# Patient Record
Sex: Female | Born: 1935 | Race: White | Hispanic: No | Marital: Married | Smoking: Never smoker
Health system: Southern US, Community
[De-identification: ages and names within clinical notes are randomized; demographics above are authoritative.]

## PROBLEM LIST (undated history)

## (undated) DIAGNOSIS — H579 Unspecified disorder of eye and adnexa: Secondary | ICD-10-CM

## (undated) DIAGNOSIS — E039 Hypothyroidism, unspecified: Secondary | ICD-10-CM

## (undated) DIAGNOSIS — K5790 Diverticulosis of intestine, part unspecified, without perforation or abscess without bleeding: Secondary | ICD-10-CM

## (undated) DIAGNOSIS — M8580 Other specified disorders of bone density and structure, unspecified site: Secondary | ICD-10-CM

## (undated) DIAGNOSIS — G4730 Sleep apnea, unspecified: Secondary | ICD-10-CM

## (undated) DIAGNOSIS — R002 Palpitations: Principal | ICD-10-CM

## (undated) DIAGNOSIS — R0609 Other forms of dyspnea: Secondary | ICD-10-CM

## (undated) DIAGNOSIS — K635 Polyp of colon: Secondary | ICD-10-CM

## (undated) DIAGNOSIS — I4891 Unspecified atrial fibrillation: Secondary | ICD-10-CM

## (undated) DIAGNOSIS — N3941 Urge incontinence: Secondary | ICD-10-CM

## (undated) DIAGNOSIS — E7800 Pure hypercholesterolemia, unspecified: Secondary | ICD-10-CM

## (undated) DIAGNOSIS — H40009 Preglaucoma, unspecified, unspecified eye: Secondary | ICD-10-CM

## (undated) DIAGNOSIS — I480 Paroxysmal atrial fibrillation: Secondary | ICD-10-CM

## (undated) HISTORY — DX: Pure hypercholesterolemia, unspecified: E78.00

## (undated) HISTORY — DX: Paroxysmal atrial fibrillation: I48.0

## (undated) HISTORY — PX: ABDOMINAL HYSTERECTOMY: SHX81

## (undated) HISTORY — PX: COLONOSCOPY: SHX174

## (undated) HISTORY — DX: Polyp of colon: K63.5

## (undated) HISTORY — DX: Palpitations: R00.2

## (undated) HISTORY — DX: Other specified disorders of bone density and structure, unspecified site: M85.80

## (undated) HISTORY — DX: Unspecified atrial fibrillation: I48.91

## (undated) HISTORY — DX: Other forms of dyspnea: R06.09

## (undated) HISTORY — DX: Urge incontinence: N39.41

## (undated) HISTORY — DX: Hypothyroidism, unspecified: E03.9

## (undated) HISTORY — DX: Sleep apnea, unspecified: G47.30

## (undated) HISTORY — DX: Diverticulosis of intestine, part unspecified, without perforation or abscess without bleeding: K57.90

## (undated) HISTORY — DX: Preglaucoma, unspecified, unspecified eye: H40.009

## (undated) HISTORY — DX: Unspecified disorder of eye and adnexa: H57.9

## (undated) HISTORY — PX: EYE SURGERY: SHX253

---

## 1997-10-19 ENCOUNTER — Other Ambulatory Visit: Admission: RE | Admit: 1997-10-19 | Discharge: 1997-10-19 | Payer: Self-pay | Admitting: Family Medicine

## 1998-10-24 ENCOUNTER — Other Ambulatory Visit: Admission: RE | Admit: 1998-10-24 | Discharge: 1998-10-24 | Payer: Self-pay | Admitting: Family Medicine

## 1999-10-30 ENCOUNTER — Other Ambulatory Visit: Admission: RE | Admit: 1999-10-30 | Discharge: 1999-10-30 | Payer: Self-pay | Admitting: Family Medicine

## 2000-01-02 ENCOUNTER — Ambulatory Visit (HOSPITAL_COMMUNITY): Admission: RE | Admit: 2000-01-02 | Discharge: 2000-01-02 | Payer: Self-pay | Admitting: Gastroenterology

## 2000-01-02 ENCOUNTER — Encounter (INDEPENDENT_AMBULATORY_CARE_PROVIDER_SITE_OTHER): Payer: Self-pay | Admitting: Specialist

## 2000-10-02 ENCOUNTER — Other Ambulatory Visit: Admission: RE | Admit: 2000-10-02 | Discharge: 2000-10-02 | Payer: Self-pay | Admitting: Family Medicine

## 2002-02-17 ENCOUNTER — Encounter: Admission: RE | Admit: 2002-02-17 | Discharge: 2002-02-17 | Payer: Self-pay | Admitting: Family Medicine

## 2002-02-17 ENCOUNTER — Encounter: Payer: Self-pay | Admitting: Family Medicine

## 2002-03-09 ENCOUNTER — Ambulatory Visit (HOSPITAL_COMMUNITY): Admission: RE | Admit: 2002-03-09 | Discharge: 2002-03-09 | Payer: Self-pay | Admitting: Family Medicine

## 2002-03-09 ENCOUNTER — Encounter: Payer: Self-pay | Admitting: Family Medicine

## 2003-04-21 ENCOUNTER — Encounter: Admission: RE | Admit: 2003-04-21 | Discharge: 2003-04-21 | Payer: Self-pay | Admitting: Family Medicine

## 2004-11-07 ENCOUNTER — Encounter: Admission: RE | Admit: 2004-11-07 | Discharge: 2004-11-07 | Payer: Self-pay | Admitting: Family Medicine

## 2005-02-05 ENCOUNTER — Encounter: Admission: RE | Admit: 2005-02-05 | Discharge: 2005-02-05 | Payer: Self-pay | Admitting: Family Medicine

## 2005-04-15 ENCOUNTER — Encounter: Admission: RE | Admit: 2005-04-15 | Discharge: 2005-04-15 | Payer: Self-pay | Admitting: Gastroenterology

## 2005-06-24 ENCOUNTER — Encounter: Admission: RE | Admit: 2005-06-24 | Discharge: 2005-06-24 | Payer: Self-pay | Admitting: Gastroenterology

## 2005-10-01 ENCOUNTER — Encounter (INDEPENDENT_AMBULATORY_CARE_PROVIDER_SITE_OTHER): Payer: Self-pay | Admitting: Specialist

## 2005-10-01 ENCOUNTER — Inpatient Hospital Stay (HOSPITAL_COMMUNITY): Admission: RE | Admit: 2005-10-01 | Discharge: 2005-10-05 | Payer: Self-pay | Admitting: Surgery

## 2005-10-01 HISTORY — PX: LAPAROSCOPIC NISSEN FUNDOPLICATION: SHX1932

## 2007-02-08 ENCOUNTER — Encounter: Admission: RE | Admit: 2007-02-08 | Discharge: 2007-02-08 | Payer: Self-pay | Admitting: Family Medicine

## 2009-06-21 ENCOUNTER — Encounter: Admission: RE | Admit: 2009-06-21 | Discharge: 2009-06-21 | Payer: Self-pay | Admitting: Family Medicine

## 2010-06-06 ENCOUNTER — Ambulatory Visit (INDEPENDENT_AMBULATORY_CARE_PROVIDER_SITE_OTHER): Payer: Medicare Other | Admitting: Family Medicine

## 2010-06-06 DIAGNOSIS — E039 Hypothyroidism, unspecified: Secondary | ICD-10-CM

## 2010-06-06 DIAGNOSIS — E7800 Pure hypercholesterolemia, unspecified: Secondary | ICD-10-CM

## 2010-06-06 DIAGNOSIS — Z79899 Other long term (current) drug therapy: Secondary | ICD-10-CM

## 2010-06-06 DIAGNOSIS — E559 Vitamin D deficiency, unspecified: Secondary | ICD-10-CM

## 2010-06-06 DIAGNOSIS — Z01419 Encounter for gynecological examination (general) (routine) without abnormal findings: Secondary | ICD-10-CM

## 2010-06-20 ENCOUNTER — Ambulatory Visit: Payer: Medicare Other

## 2010-07-19 NOTE — Procedures (Signed)
[facility]  Patient:    Abigail Day, Abigail Day                           MRN: [id number] Proc. Date: 01/02/00 Adm. Date:  [id number] Attending:  Nelda Marseille CC:         Vale Haven. Andrey Campanile, M.D.   Procedure Report  REFERRING PHYSICIAN:  Duffy Rhody C. Andrey Campanile, M.D.  PROCEDURE:  Colonoscopy with biopsy.  INDICATION:  Patient with periodic diarrhea, due for colonic screening. Consent was signed after risks, benefits, methods, and options thoroughly discussed in the office.  MEDICATIONS:  Demerol 40 mg, Versed 5 mg.  DESCRIPTION OF PROCEDURE:  Rectal inspection was pertinent for external hemorrhoids.  Digital exam was negative.  The pediatric video colonoscope was inserted and once past a tortuous, diverticula-filled sigmoid was easily able to advance to the cecum.  This did require some left lower quadrant pressure and rolling her on her back.  On insertion, other than the left-sided diverticula, no other abnormalities were seen.  The cecum was identified by the appendiceal orifice and the ileocecal valve.  In fact, the scope was inserted a short way into the terminal ileum, which was normal.  Photo documentation and biopsies were obtained.  The scope was slowly withdrawn. The colon appeared normal on the right side and in the transverse.  The diverticula were all on the left side, and scattered random occasional biopsies were obtained to rule out microscopic colitis.  With slow withdrawal through the colon, no polypoid lesions, masses, or other abnormalities were seen.  Prep was adequate.  There was a small amount of liquid stool that required washing and suctioning.  Once back in the rectum, the scope was then retroflexed, pertinent for some internal hemorrhoids.  The scope was straightened, air was withdrawn, and the scope removed.  The patient tolerated the procedure well.  There was no obvious immediate complication.  ENDOSCOPIC DIAGNOSES: 1.  Internal-external hemorrhoids. 2. Significant left-sided diverticula and tortuosity. 3. Otherwise within normal limits to the terminal ileum, status post random    biopsies throughout.  PLAN:  Await pathology to see if any signs of microscopic colitis.  Repeat screening in five years unless needed sooner p.r.n.  Call me p.r.n., otherwise see me back in two to three months to recheck symptoms and make sure no further workup plans are needed.  In the meantime, let her use Robinul p.r.n., and we discussed its use prior to her procedure.  Would start with one-half to one in the morning and then on a bad day go up to two to three times a day but on good days, maybe not take any more. DD:  01/02/00 TD:  01/03/00 Job: [id number]

## 2010-07-19 NOTE — Op Note (Signed)
NAME:  Abigail Day, Abigail Day NO.:  [id number]   MEDICAL RECORD NO.:  [id number]          PATIENT TYPE:  INP   LOCATION:  0001                         FACILITY:  [facility]   PHYSICIAN:  Thornton Park. Daphine Deutscher, MD  DATE OF BIRTH:  February 21, 1934   DATE OF PROCEDURE:  10/01/2005  DATE OF DISCHARGE:                                 OPERATIVE REPORT   CCS NUMBER:  [id number].   PREOPERATIVE DIAGNOSIS:  A 77 year old white female with giant type III  mixed hiatal hernia incarcerated in her chest.   POSTOPERATIVE DIAGNOSIS:  A 77 year old white female with giant type III  mixed hiatal hernia incarcerated in her chest.   PROCEDURE:  Laparoscopic takedown of incarcerated hiatal hernia containing  her stomach, primary repair of her diaphragm with pledgeted sutures and  onlay of musculoskeletal transplant tissue sutured to the diaphragm,  multiple upper endoscopies and Nissen fundoplication over a 50-French  lighted bougie.   SURGEON:  Thornton Park. Daphine Deutscher, MD.   ASSISTANT:  Ollen Gross. Carolynne Edouard, M.D.   ANESTHESIA:  General.   OPERATIVE TIME:  3 hours 20 minutes.   DESCRIPTION OF PROCEDURE:  Abigail Day was taken to room #1, given general  anesthesia.  It was prepped with chlorhexidine and draped sterilely.  I  entered the abdomen through the left upper quadrant using a 0 degree scope  and an OptiVu technique, straight in, insufflating without difficulty.  A  total of six trocars were used in the upper abdomen.  Nathanson retractor  was used retract the liver.  Initial visualization showed opening in the  diaphragm containing the stomach, roughly two thirds of the stomach and the  chest.  This was brought down but would easily snap back up into the chest.   The dissection was begun on the right crus where I divided the hernia sac at  that location and then carried this anteriorly to the midline.  I then went  down and took down the short gastrics and went up toward the chest and into  the chest  through the diaphragmatic opening and then mobilized a bit of the  stomach that way.  I then held gentle tension and was able to get the  stomach down below the diaphragm and there was a large fat pad that was  stuck up in the hernia sac and I teased this way gently from the surrounding  structures in the chest and I eventually got this entire thing to come down  and when I had it freed up, the esophagus was at length into the abdomen and  the entire stomach was in the abdomen.  This was a very tedious dissection  that took an hour and a half to do.  Once there, I went ahead and endoscoped  the patient to validate the landmarks and then I excised the fat pad on the  anterior lateral surface and I wanted to make sure that it did not perforate  so I rescoped her and did not see any evidence of a perforation.  So I was  happy with the  anatomy after taking down incarcerated stomach from the  chest.   I then repaired the hiatus with three sutures using the Endo stitch, using  Surgidac and with pledgets posteriorly and this became pretty snuggly around  the hiatus and the esophagus.  Once this was done, I cut a piece of the  human AlloDerm and the cut it and split it so that I could inserted.  This  was the extra thick and it came around the esophagus and I sutured it to the  diaphragm on the right side and I sutured to itself and then on the left  side and sutured up on both left and right crus with interrupted horizontal  mattress sutures anteriorly.  This seemed to hold it nicely in place.   Next, I reached around and brought the stomach around for a Nissen wrap and  passed the 70 lighted bougie.  With the bougie in place, I performed a  Nissen fundoplication using three sutures tied intracorporeally getting a  purchase of the esophagus on the upper two.  These were tied down and clips  were placed on the knots.  I put Tisseel beneath this to help with adherence  and sealing.  Picture was  taken and it looked good.  No evidence of any  bleeding was noted.  The rest of the abdomen contents looked good and  unremarkable.  I did use a 15 in the upper midline which I closed with a  simple 0 Vicryl and all other ports were closed with 4-0 Vicryl, Benzoin and  Steri-Strips.  The patient seemed to tolerate the procedure well and was  taken to the recovery room and will go to the step-down unit postop.      Thornton Park Daphine Deutscher, MD  Electronically Signed     MBM/MEDQ  D:  10/01/2005  T:  10/01/2005  Job:  [id number]   cc:   Lavonda Jumbo, M.D.  Fax: [phone]   Petra Kuba, M.D.  Fax: [phone]

## 2010-07-19 NOTE — Discharge Summary (Signed)
NAME:  Abigail Day, NAEVE NO.:  [id number]   MEDICAL RECORD NO.:  [id number]          PATIENT TYPE:  INP   LOCATION:  1616                         FACILITY:  [facility]   PHYSICIAN:  Thornton Park. Daphine Deutscher, MD  DATE OF BIRTH:  August 25, 1933   DATE OF ADMISSION:  10/01/2005  DATE OF DISCHARGE:  10/05/2005                                 DISCHARGE SUMMARY   CHIEF COMPLAINT:  Large, type 3, mixed hiatal hernia with partial esophageal  obstruction.   PROCEDURE:  Laparoscopic repair of giant, type 3, mixed hiatal hernia with  takedown of incarcerated hiatal hernia, repair of the diaphragm with human  allograft mesh, Nissen fundoplication and upper endoscopy.   COURSE IN THE HOSPITAL:  Keari Miu is a 77 year old lady who underwent the  above-mentioned operation laparoscopically.  She did very well.  I got a  swallow on her on postop day #1 which looked good.  She was advanced to a  full liquid diet, and a chest x-ray looked okay, and she was ready for  discharge on October 05, 2005.  Condition: Good. Scheduled to come back to the  office within 2 weeks to see me.   PROGNOSIS:  Good, condition good.      Thornton Park Daphine Deutscher, MD  Electronically Signed     MBM/MEDQ  D:  10/14/2005  T:  10/14/2005  Job:  [id number]   cc:   Petra Kuba, M.D.  Fax: [phone]   Lavonda Jumbo, M.D.  Fax: [phone]

## 2010-07-24 ENCOUNTER — Encounter: Payer: Self-pay | Admitting: Family Medicine

## 2010-07-24 ENCOUNTER — Ambulatory Visit (INDEPENDENT_AMBULATORY_CARE_PROVIDER_SITE_OTHER): Payer: Medicare Other | Admitting: Family Medicine

## 2010-07-24 VITALS — BP 128/70 | HR 72 | Ht 59.0 in | Wt 123.0 lb

## 2010-07-24 DIAGNOSIS — N390 Urinary tract infection, site not specified: Secondary | ICD-10-CM

## 2010-07-24 LAB — POCT URINALYSIS DIPSTICK
Bilirubin, UA: NEGATIVE
Glucose, UA: NEGATIVE
Ketones, UA: NEGATIVE
Protein, UA: NEGATIVE
Spec Grav, UA: 1.02
Urobilinogen, UA: NEGATIVE

## 2010-07-24 MED ORDER — CIPROFLOXACIN HCL 500 MG PO TABS: 500.0000 mg | ORAL_TABLET | Freq: Two times a day (BID) | ORAL | Status: AC

## 2010-07-24 NOTE — Progress Notes (Signed)
Subjective:    Patient ID: Abigail Day, female    DOB: 01-Jan-1934, 77 y.o.   MRN: [id number]  HPI Patient presents with 1 week of low back pain, 3-4 days of dysuria, urinary urgency, frequency and incontinence. Denies hematuria.  Denies fevers, chills, nausea or vomiting. Continues to have mild back pain (lower kidney area on both sides).  Drinking a lot of cranberry juice and water.  Having some trouble voiding for a urine sample in the office today.  Had UTI last 06/10/10, growing E. Coli, sensitive to all ABX and was treated with Septra DS  Past Medical History  Diagnosis Date  . Pure hypercholesterolemia   . Osteopenia   . Diverticulosis   . Urge urinary incontinence   . Hemorrhoids     internal and external  . Glaucoma suspect   . Unspecified hypothyroidism     s/p ablation for hyperthyroidism    Past Surgical History  Procedure Date  . Abdominal hysterectomy age 58    with BSO and bladder tack  . Eye surgery     bilateral cataract surgery  . Colonoscopy 11/06  . Laparoscopic nissen fundoplication 8/07    Dr. Daphine Deutscher    History   Social History  . Marital Status: Married    Spouse Name: N/A    Number of Children: N/A  . Years of Education: N/A   Occupational History  . Not on file.   Social History Main Topics  . Smoking status: Never Smoker   . Smokeless tobacco: Never Used  . Alcohol Use: Yes     wine, 1-2 glasses 1-2 times a month  . Drug Use: No  . Sexually Active: Not on file   Other Topics Concern  . Not on file   Social History Narrative  . No narrative on file    Family History  Problem Relation Age of Onset  . Heart disease Mother   . Heart disease Father   . Diabetes Father   . Marfan syndrome Sister   . Heart disease Sister     valve replacement  . Heart disease Brother   . Heart disease Brother   . Heart disease Brother   . Heart disease Brother   . Heart disease Brother   . Heart disease Brother   . Heart disease Brother   .  Heart disease Sister     pacemaker  . Cancer Sister     breast cancer  . Aortic aneurysm Sister   . Cancer Sister 5    colon cancer    Current outpatient prescriptions:aspirin 81 MG tablet, Take 81 mg by mouth daily.  , Disp: , Rfl: ;  Calcium Carbonate-Vitamin D (CALCIUM 600 + D PO), Take 2 tablets by mouth daily.  , Disp: , Rfl: ;  Cholecalciferol (VITAMIN D) 1000 UNITS capsule, Take 1,000 Units by mouth daily.  , Disp: , Rfl: ;  co-enzyme Q-10 30 MG capsule, Take 30 mg by mouth daily.  , Disp: , Rfl:  glucosamine-chondroitin 500-400 MG tablet, Take 1 tablet by mouth daily.  , Disp: , Rfl: ;  levothyroxine (SYNTHROID, LEVOTHROID) 75 MCG tablet, Take 75 mcg by mouth daily.  , Disp: , Rfl: ;  Multiple Vitamin (MULTI-VITAMIN) tablet, Take 1 tablet by mouth daily.  , Disp: , Rfl: ;  pravastatin (PRAVACHOL) 40 MG tablet, Take 40 mg by mouth daily.  , Disp: , Rfl:  ciprofloxacin (CIPRO) 500 MG tablet, Take 1 tablet (500 mg total) by mouth 2 (  two) times daily., Disp: 14 tablet, Rfl: 0  No Known Allergies  Review of Systems See HPI.  No URI symptoms, cough, headaches, chest pain, rashes    Objective:   Physical Exam Well developed, well nourished female, appearing her stated age, in no distress BP 128/70  Pulse 72  Ht 4\' 11"  (1.499 m)  Wt 123 lb (55.792 kg)  BMI 24.84 kg/m2 Back: mild CVA tenderness L, nontender on R Mild suprapubic tenderness.  No hepatosplenomegaly or masses Extremities:  No edema Skin, no rashes     Assessment & Plan:   1. UTI (urinary tract infection)  POCT urinalysis dipstick, ciprofloxacin (CIPRO) 500 MG tablet   Advised to contact us if symptoms aren't improving over the next 48 hours.  Follow up immediately if develops fevers, nausea, vomiting, worsening pain or other concerns

## 2010-08-29 ENCOUNTER — Telehealth: Payer: Self-pay | Admitting: Family Medicine

## 2010-08-29 NOTE — Telephone Encounter (Signed)
FAXED LAST LABS & STOOL CARD RESULT TO DR MAGOD PER PT'S REQUEST-LM

## 2010-08-30 ENCOUNTER — Encounter: Payer: Self-pay | Admitting: Family Medicine

## 2010-09-16 ENCOUNTER — Other Ambulatory Visit: Payer: Self-pay | Admitting: *Deleted

## 2010-09-16 MED ORDER — LEVOTHYROXINE SODIUM 75 MCG PO TABS: 75.0000 ug | ORAL_TABLET | Freq: Every day | ORAL | Status: DC

## 2010-09-16 MED ORDER — PRAVASTATIN SODIUM 40 MG PO TABS: 40.0000 mg | ORAL_TABLET | Freq: Every day | ORAL | Status: DC

## 2010-09-25 ENCOUNTER — Other Ambulatory Visit: Payer: Self-pay | Admitting: Gastroenterology

## 2010-09-25 DIAGNOSIS — K635 Polyp of colon: Secondary | ICD-10-CM

## 2010-09-25 HISTORY — PX: ESOPHAGOGASTRODUODENOSCOPY: SHX1529

## 2010-09-25 HISTORY — DX: Polyp of colon: K63.5

## 2010-11-06 ENCOUNTER — Telehealth: Payer: Self-pay | Admitting: Family Medicine

## 2010-11-06 DIAGNOSIS — E7800 Pure hypercholesterolemia, unspecified: Secondary | ICD-10-CM

## 2010-11-06 MED ORDER — PRAVASTATIN SODIUM 40 MG PO TABS: 40.0000 mg | ORAL_TABLET | Freq: Every day | ORAL | Status: DC

## 2010-11-06 NOTE — Telephone Encounter (Signed)
done

## 2011-01-08 ENCOUNTER — Encounter: Payer: Self-pay | Admitting: Family Medicine

## 2011-01-08 ENCOUNTER — Ambulatory Visit (INDEPENDENT_AMBULATORY_CARE_PROVIDER_SITE_OTHER): Payer: Medicare Other | Admitting: Family Medicine

## 2011-01-08 VITALS — BP 128/74 | HR 72 | Ht 59.0 in | Wt 123.0 lb

## 2011-01-08 DIAGNOSIS — N390 Urinary tract infection, site not specified: Secondary | ICD-10-CM

## 2011-01-08 DIAGNOSIS — R002 Palpitations: Secondary | ICD-10-CM

## 2011-01-08 DIAGNOSIS — E782 Mixed hyperlipidemia: Secondary | ICD-10-CM

## 2011-01-08 DIAGNOSIS — M545 Low back pain, unspecified: Secondary | ICD-10-CM

## 2011-01-08 DIAGNOSIS — Z23 Encounter for immunization: Secondary | ICD-10-CM

## 2011-01-08 DIAGNOSIS — E039 Hypothyroidism, unspecified: Secondary | ICD-10-CM

## 2011-01-08 LAB — POCT URINALYSIS DIPSTICK
Bilirubin, UA: NEGATIVE
Glucose, UA: NEGATIVE
Ketones, UA: NEGATIVE
Protein, UA: NEGATIVE
Urobilinogen, UA: 0.2
pH, UA: 7

## 2011-01-08 MED ORDER — CIPROFLOXACIN HCL 500 MG PO TABS: 500.0000 mg | ORAL_TABLET | Freq: Two times a day (BID) | ORAL | Status: AC

## 2011-01-08 NOTE — Progress Notes (Signed)
Chief Complaint: lower back pain x 1 month. Also has noticed a smell when urinating  HPI:  Patient presents with complaint of low back pain x 1 month.  Pain is on right side, at lower ribs (described as a "nagging ache", and sometimes spreads up to her shoulderblade area, and here it is "sharp pains, burning and stinging".  Relieved by rest, pressure against the shoulder blade also helps ease the pain. Tylenol also helps. Seems to flare up when she is doing a lot of lifting, cleaning house.  [facility] seem to be [facility]  Denies numbness, tingling or weakness into her arms or legs.  Reports her "legs getting weak every once in a while", described as some weakness in her calves, feels a little wobbly when she stands.  Sometimes her heart will pound and she gets flushed. Feels similar to when she was having thyroid troubles in the past.  Seems to be worse in the afternoons.  Denies dysuria, but sometimes notices a bad smell when voiding.  Having some urgency and frequency.  Denies fevers, no nausea or vomiting.  Past Medical History  Diagnosis Date  . Pure hypercholesterolemia   . Osteopenia   . Diverticulosis   . Urge urinary incontinence   . Hemorrhoids     internal and external  . Glaucoma suspect   . Unspecified hypothyroidism     s/p ablation for hyperthyroidism    Past Surgical History  Procedure Date  . Abdominal hysterectomy age 62    with BSO and bladder tack  . Eye surgery     bilateral cataract surgery  . Colonoscopy 11/06  . Laparoscopic nissen fundoplication 8/07    Dr. Daphine Deutscher    History   Social History  . Marital Status: Married    Spouse Name: N/A    Number of Children: N/A  . Years of Education: N/A   Occupational History  . Not on file.   Social History Main Topics  . Smoking status: Never Smoker   . Smokeless tobacco: Never Used  . Alcohol Use: Yes     wine, 1-2 glasses 1-2 times a month  . Drug Use: No  . Sexually Active: Not on  file   Other Topics Concern  . Not on file   Social History Narrative  . No narrative on file    Family History  Problem Relation Age of Onset  . Heart disease Mother   . Heart disease Father   . Diabetes Father   . Marfan syndrome Sister   . Heart disease Sister     valve replacement  . Heart disease Brother   . Heart disease Brother   . Heart disease Brother   . Heart disease Brother   . Heart disease Brother   . Heart disease Brother   . Heart disease Brother   . Heart disease Sister     pacemaker  . Cancer Sister     breast cancer  . Aortic aneurysm Sister   . Cancer Sister 67    colon cancer   Current Outpatient Prescriptions on File Prior to Visit  Medication Sig Dispense Refill  . aspirin 81 MG tablet Take 81 mg by mouth daily.        . Calcium Carbonate-Vitamin D (CALCIUM 600 + D PO) Take 2 tablets by mouth daily.        . Cholecalciferol (VITAMIN D) 1000 UNITS capsule Take 1,000 Units by mouth daily.        Marland Kitchen  co-enzyme Q-10 30 MG capsule Take 30 mg by mouth daily.        Marland Kitchen glucosamine-chondroitin 500-400 MG tablet Take 1 tablet by mouth daily.        Marland Kitchen levothyroxine (SYNTHROID, LEVOTHROID) 75 MCG tablet Take 1 tablet (75 mcg total) by mouth daily.  90 tablet  0  . Multiple Vitamin (MULTI-VITAMIN) tablet Take 1 tablet by mouth daily.        . pravastatin (PRAVACHOL) 40 MG tablet Take 1 tablet (40 mg total) by mouth daily.  90 tablet  0   No Known Allergies  ROS: No skin rashes, other joint pains, chest pains.  Slight DOE with walking, but able to do her regular exercise routine without any problems (elliptical, bike, classes).  Denies URI symptoms, cough.  Had EGD and colonoscopy a few months ago (?September) by Dr. Ewing Schlein.  No results were received here, but patient was told that everything was normal.  Chart reviewed.  Last labs were 06/06/2010--normal c-met, TSH, Vitamin D, CBC (WBC 3.4, stable); chol 228, HDL 63, LDL 130, TG 174.  PHYSICAL EXAM: BP 128/74   Pulse 72  Ht 4\' 11"  (1.499 m)  Wt 123 lb (55.792 kg)  BMI 24.84 kg/m2 Well developed, pleasant female, in no distress Spine nontender.  Tender at R CVA and at rhomboid muscles on R Neck: no thyromegaly or lymphadenopathy Heart: regular rate and rhythm without murmur Lungs: clear bilaterally Abdomen: mild suprapubic tenderness.  No organomegaly or mass Extremities: no edema Skin: no rash  ASSESSMENT/PLAN: 1. LBP (low back pain)  POCT Urinalysis Dipstick  2. Need for prophylactic vaccination and inoculation against influenza  Flu vaccine greater than or equal to 3yo preservative free IM  3. Palpitations  CBC with Differential  4. Unspecified hypothyroidism  TSH  5. Mixed hyperlipidemia  Lipid panel, Comprehensive metabolic panel  6. Urinary tract infection, site not specified  Urine Culture, ciprofloxacin (CIPRO) 500 MG tablet   Back pain--seems to be multifactorial.  There is definitely a musculoskeletal component, affecting the R shoulder blade.  Treat with heat, massage, Aleve prn (and/or Tylenol if needed). Call for trial of muscle relaxant if not improving.  Limit overhead movements/exercises and arm movements with elliptical over the next week.  CVA tenderness may also be MSK, but she has h/o UTI's and abnormal u/a today, so will cover for early pyelonephritis.  Last UTI was 06/2010, and was sensitive to all ABX, treated with Septra DS. Urine dip: 1+ Leuks and 1+ blood.  Only small amount of urine--not enough to spin and culture, so will just be sent for culture.  Treat presumptively with Cipro while culture is pending  Hypothyroidism--re-check TSH given recurrence of cardiac symptoms Hyperlipidemia--borderline control of LDL and TG per last labs.  Due for repeat. She is not fasting today--will return later this week.  Flu shot given   Return later this week for fasting labs--C-met, TSH, lipid

## 2011-01-08 NOTE — Patient Instructions (Signed)
Do stretches as shown at least 3 times daily. Heat, and massage will also help Try Aleve--you may use up to 2 tablets twice a day, take with food.  Do this for up to a week.  You may also use Tylenol along with it, only if needed for pain  If your shoulder blade pain isn't improving, call for a prescription for a muscle relaxant.  These may make you sleepy  Call if your urinary symptoms are worsening, if you develop fevers, nausea, or vomiting, or unable to take your antibiotics

## 2011-01-09 ENCOUNTER — Other Ambulatory Visit: Payer: Medicare Other

## 2011-01-09 DIAGNOSIS — R002 Palpitations: Secondary | ICD-10-CM

## 2011-01-09 DIAGNOSIS — E039 Hypothyroidism, unspecified: Secondary | ICD-10-CM

## 2011-01-09 DIAGNOSIS — E782 Mixed hyperlipidemia: Secondary | ICD-10-CM

## 2011-01-09 LAB — CBC WITH DIFFERENTIAL/PLATELET
Basophils Absolute: 0 10*3/uL (ref 0.0–0.1)
Lymphocytes Relative: 25 % (ref 12–46)
Lymphs Abs: 0.9 10*3/uL (ref 0.7–4.0)
MCV: 101.2 fL — ABNORMAL HIGH (ref 78.0–100.0)
Monocytes Absolute: 0.4 10*3/uL (ref 0.1–1.0)
Monocytes Relative: 10 % (ref 3–12)
Neutro Abs: 2.2 10*3/uL (ref 1.7–7.7)
Platelets: 232 10*3/uL (ref 150–400)
RBC: 4.21 MIL/uL (ref 3.87–5.11)
RDW: 12.9 % (ref 11.5–15.5)
WBC: 3.5 10*3/uL — ABNORMAL LOW (ref 4.0–10.5)

## 2011-01-09 LAB — LIPID PANEL: HDL: 52 mg/dL (ref >39)

## 2011-01-09 LAB — COMPREHENSIVE METABOLIC PANEL
ALT: 13 U/L (ref 0–35)
AST: 19 U/L (ref 0–37)
Albumin: 4.3 g/dL (ref 3.5–5.2)
Alkaline Phosphatase: 61 U/L (ref 39–117)
BUN: 17 mg/dL (ref 6–23)
Potassium: 4.2 mEq/L (ref 3.5–5.3)
Total Bilirubin: 0.5 mg/dL (ref 0.3–1.2)
Total Protein: 6.3 g/dL (ref 6.0–8.3)

## 2011-01-10 ENCOUNTER — Encounter: Payer: Self-pay | Admitting: Family Medicine

## 2011-01-10 LAB — URINE CULTURE: Colony Count: >=100000

## 2011-01-15 ENCOUNTER — Encounter: Payer: Self-pay | Admitting: Family Medicine

## 2011-01-20 ENCOUNTER — Telehealth: Payer: Self-pay | Admitting: Family Medicine

## 2011-01-20 MED ORDER — CIPROFLOXACIN HCL 500 MG PO TABS: 500.0000 mg | ORAL_TABLET | Freq: Two times a day (BID) | ORAL | Status: AC

## 2011-01-20 NOTE — Telephone Encounter (Signed)
Advise pt that 10 day course if Cipro was sent to pharmacy. If not better, needs f/u for repeat u/a and culture

## 2011-01-20 NOTE — Telephone Encounter (Signed)
Pt called she is not 100% better, she is still hurting around her back/kidney area and it stings and hurts when she urinates.  Please order 2nd round antibiotics.

## 2011-01-20 NOTE — Telephone Encounter (Signed)
Spoke with patient and let her know that 10 day course of Cipro was called into pharmacy and if not better after this round of abx she needs a repeat UA appt.

## 2011-02-09 ENCOUNTER — Other Ambulatory Visit: Payer: Self-pay | Admitting: Family Medicine

## 2011-02-11 ENCOUNTER — Other Ambulatory Visit: Payer: Self-pay | Admitting: Family Medicine

## 2011-03-25 DIAGNOSIS — H40029 Open angle with borderline findings, high risk, unspecified eye: Secondary | ICD-10-CM | POA: Diagnosis not present

## 2011-03-25 DIAGNOSIS — H04129 Dry eye syndrome of unspecified lacrimal gland: Secondary | ICD-10-CM | POA: Diagnosis not present

## 2011-03-25 DIAGNOSIS — H3530 Unspecified macular degeneration: Secondary | ICD-10-CM | POA: Diagnosis not present

## 2011-03-25 DIAGNOSIS — Z961 Presence of intraocular lens: Secondary | ICD-10-CM | POA: Diagnosis not present

## 2011-03-26 DIAGNOSIS — H0520 Unspecified exophthalmos: Secondary | ICD-10-CM | POA: Diagnosis not present

## 2011-07-17 ENCOUNTER — Encounter: Payer: Self-pay | Admitting: Internal Medicine

## 2011-07-24 ENCOUNTER — Ambulatory Visit (INDEPENDENT_AMBULATORY_CARE_PROVIDER_SITE_OTHER): Payer: Medicare Other | Admitting: Family Medicine

## 2011-07-24 ENCOUNTER — Encounter: Payer: Self-pay | Admitting: Family Medicine

## 2011-07-24 VITALS — BP 128/80 | HR 76 | Ht 58.5 in | Wt 123.0 lb

## 2011-07-24 DIAGNOSIS — D7589 Other specified diseases of blood and blood-forming organs: Secondary | ICD-10-CM | POA: Diagnosis not present

## 2011-07-24 DIAGNOSIS — N390 Urinary tract infection, site not specified: Secondary | ICD-10-CM

## 2011-07-24 DIAGNOSIS — E782 Mixed hyperlipidemia: Secondary | ICD-10-CM

## 2011-07-24 DIAGNOSIS — E039 Hypothyroidism, unspecified: Secondary | ICD-10-CM

## 2011-07-24 LAB — POCT URINALYSIS DIPSTICK
Bilirubin, UA: NEGATIVE
Nitrite, UA: NEGATIVE
pH, UA: 6

## 2011-07-24 LAB — CBC WITH DIFFERENTIAL/PLATELET
Basophils Absolute: 0 10*3/uL (ref 0.0–0.1)
Basophils Relative: 0 % (ref 0–1)
Eosinophils Relative: 2 % (ref 0–5)
HCT: 41.1 % (ref 36.0–46.0)
Hemoglobin: 13.9 g/dL (ref 12.0–15.0)
Lymphs Abs: 1 10*3/uL (ref 0.7–4.0)
MCV: 97.6 fL (ref 78.0–100.0)
Monocytes Absolute: 0.4 10*3/uL (ref 0.1–1.0)
Neutro Abs: 1.8 10*3/uL (ref 1.7–7.7)
Neutrophils Relative %: 55 % (ref 43–77)

## 2011-07-24 LAB — HEPATIC FUNCTION PANEL
AST: 23 U/L (ref 0–37)
Albumin: 4.4 g/dL (ref 3.5–5.2)
Total Bilirubin: 0.5 mg/dL (ref 0.3–1.2)

## 2011-07-24 LAB — LIPID PANEL
HDL: 56 mg/dL (ref >39)
LDL Cholesterol: 124 mg/dL — ABNORMAL HIGH (ref 0–99)
Total CHOL/HDL Ratio: 3.8 Ratio
VLDL: 33 mg/dL (ref 0–40)

## 2011-07-24 MED ORDER — CIPROFLOXACIN HCL 250 MG PO TABS: 250.0000 mg | ORAL_TABLET | Freq: Two times a day (BID) | ORAL | Status: AC

## 2011-07-24 NOTE — Patient Instructions (Signed)
We will contact you with labs in the next few days. It appears you have a bladder infection.  Take the antibiotics. You likely also have an underlying problem causing the leakage of urine (overactive bladder).  Try and void frequently to not let bladder get full.  Call if you have remaining leakage symptoms after UTI symptoms have resolved to try medications discussed at visit (Ditropan, Detrol, etc.)

## 2011-07-24 NOTE — Progress Notes (Signed)
Chief Complaint  Patient presents with  . Follow-up    6 month f/u and having urinary problems, feels like you can go but then other times she has to go right then and there. lowe back pain every once in a while   HPI:  Having urinary symptoms on and off for a week or so at a time, symptoms resolve, then recurs.  Having symptoms now for a week, with urinary urgency, some urge incontinence (leaking when has to go badly).  Denies dysuria.  Also having frequency.  Denies hematuria, or odor.  Symptoms seem worse at night, sometimes up 4 times/night.   Hyperlipidemia follow-up:  Patient is reportedly following a low-fat, low cholesterol diet.  Compliant with medications and denies medication side effects  Hypothyroidism--last TSH was normal in November. Hair feels dry, and feels like she is losing more than usual.  Feels like she is moving her bowels more frequently than normal.  Sometimes she is having fast heartbeat.  Occasionally feels fatigued.  Had to have MRI because Dr. Dione Booze noted R eye bulging.  He thinks it is due to her thyroid disease (in past), no mass behind eye.  She is to f/u with him in 6 months.  Chart review--slightly worsening macrocytosis (MCV 101 on last check) Had colonoscopy in April 2012--no reports received  Past Medical History  Diagnosis Date  . Pure hypercholesterolemia   . Osteopenia   . Diverticulosis   . Urge urinary incontinence   . Hemorrhoids     internal and external  . Glaucoma suspect   . Unspecified hypothyroidism     s/p ablation for hyperthyroidism  . Hyperplastic colon polyp 09/25/10    Dr. Ewing Schlein  . Hemorrhoids 09/25/10    internal and external    Past Surgical History  Procedure Date  . Abdominal hysterectomy age 62    with BSO and bladder tack  . Eye surgery     bilateral cataract surgery  . Colonoscopy 11/06, 09/25/10    hyperplastic polyp 2012  . Laparoscopic nissen fundoplication 8/07    Dr. Daphine Deutscher  . Esophagogastroduodenoscopy  09/25/10    tortuous esophagus, intact Nissen fundoplication, dilated the spasm at esophageal GE junction    History   Social History  . Marital Status: Married    Spouse Name: N/A    Number of Children: N/A  . Years of Education: N/A   Occupational History  . Not on file.   Social History Main Topics  . Smoking status: Never Smoker   . Smokeless tobacco: Never Used  . Alcohol Use: Yes     wine, 1-2 glasses 1-2 times a month  . Drug Use: No  . Sexually Active: Not on file   Other Topics Concern  . Not on file   Social History Narrative  . No narrative on file    Family History  Problem Relation Age of Onset  . Heart disease Mother   . Heart disease Father   . Diabetes Father   . Marfan syndrome Sister   . Heart disease Sister     valve replacement  . Heart disease Brother   . Diabetes Brother   . Heart disease Brother   . Diabetes Brother   . Heart disease Brother   . Heart disease Brother   . Heart disease Brother   . Heart disease Brother   . Heart disease Brother   . Heart disease Sister     pacemaker  . Cancer Sister 66  breast cancer  . Aortic aneurysm Sister   . Cancer Sister 72    colon cancer  . Heart disease Sister     CABG   Current Outpatient Prescriptions on File Prior to Visit  Medication Sig Dispense Refill  . aspirin 81 MG tablet Take 81 mg by mouth daily.        . Calcium Carbonate-Vitamin D (CALCIUM 600 + D PO) Take 2 tablets by mouth daily.        . Cholecalciferol (VITAMIN D) 1000 UNITS capsule Take 1,000 Units by mouth daily.        Marland Kitchen co-enzyme Q-10 30 MG capsule Take 30 mg by mouth daily.        Marland Kitchen glucosamine-chondroitin 500-400 MG tablet Take 1 tablet by mouth daily.        Marland Kitchen levothyroxine (SYNTHROID, LEVOTHROID) 75 MCG tablet TAKE ONE TABLET BY MOUTH DAILY.  90 tablet  2  . Multiple Vitamin (MULTI-VITAMIN) tablet Take 1 tablet by mouth daily.        . pravastatin (PRAVACHOL) 40 MG tablet TAKE ONE TABLET BY MOUTH DAILY  30  tablet  5   No Known Allergies  ROS:  Denies fevers, URI symptoms, shortness of breath.  Occasional palpitations and frequent stools.  Occasionally gets a small pimple near her buttock.  No other skin concerns. Denies chest pain.  PHYSICAL EXAM: BP 128/80  Pulse 76  Ht 4' 10.5" (1.486 m)  Wt 123 lb (55.792 kg)  BMI 25.27 kg/m2 HEENT:  PERRL, EOMI.  Some white visualized below both iris', perhaps 1mm or less more on R side than L.  Very slight anterior bulge compared to L eye.  OP clear Neck: no lymphadenopathy, thyromegaly or bruit Heart: regular rate and rhythm without murmur Lungs: clear bilaterally Back: no CVA tenderness, spine nontender Abdomen: soft, nontender, no suprapubic tenderness, organomegaly or mass Extremities: no edema Skin: no rash Psych: normal mood, affect, hygiene and grooming  ASSESSMENT/PLAN: 1. Mixed hyperlipidemia  Lipid panel, Hepatic function panel  2. Unspecified hypothyroidism  TSH  3. Macrocytosis  CBC with Differential  4. Urinary tract infection, site not specified  ciprofloxacin (CIPRO) 250 MG tablet, Urine culture, POCT Urinalysis Dipstick   Discussed probability of UTI plus underlying urge urinary incontinence, as she has had this ongoing.  Briefly discussed medications (ditropan generic vs Detrol LA or other brands), to consider starting if urge incontinence persists and is bothersome after treatment of UTI. Pt is already complaining of some mild dry mouth--consider trying ditropan first, possibly even dosing just at bedtime since that is when symptoms are most severe, and that might decrease the degree of dry mouth.

## 2011-07-25 ENCOUNTER — Encounter: Payer: Self-pay | Admitting: Family Medicine

## 2011-07-26 LAB — URINE CULTURE: Colony Count: >=100000

## 2011-08-15 ENCOUNTER — Encounter: Payer: Self-pay | Admitting: Family Medicine

## 2011-10-02 DIAGNOSIS — H40029 Open angle with borderline findings, high risk, unspecified eye: Secondary | ICD-10-CM | POA: Diagnosis not present

## 2011-10-02 DIAGNOSIS — H01139 Eczematous dermatitis of unspecified eye, unspecified eyelid: Secondary | ICD-10-CM | POA: Diagnosis not present

## 2011-10-02 DIAGNOSIS — H3530 Unspecified macular degeneration: Secondary | ICD-10-CM | POA: Diagnosis not present

## 2011-10-02 DIAGNOSIS — Z961 Presence of intraocular lens: Secondary | ICD-10-CM | POA: Diagnosis not present

## 2011-11-11 ENCOUNTER — Other Ambulatory Visit: Payer: Self-pay | Admitting: Family Medicine

## 2012-01-08 ENCOUNTER — Encounter: Payer: Self-pay | Admitting: Family Medicine

## 2012-01-08 ENCOUNTER — Ambulatory Visit (INDEPENDENT_AMBULATORY_CARE_PROVIDER_SITE_OTHER): Payer: Medicare Other | Admitting: Family Medicine

## 2012-01-08 VITALS — BP 158/90 | HR 72 | Ht <= 58 in | Wt 124.0 lb

## 2012-01-08 DIAGNOSIS — M8580 Other specified disorders of bone density and structure, unspecified site: Secondary | ICD-10-CM

## 2012-01-08 DIAGNOSIS — Z23 Encounter for immunization: Secondary | ICD-10-CM

## 2012-01-08 DIAGNOSIS — E039 Hypothyroidism, unspecified: Secondary | ICD-10-CM | POA: Diagnosis not present

## 2012-01-08 DIAGNOSIS — M949 Disorder of cartilage, unspecified: Secondary | ICD-10-CM

## 2012-01-08 DIAGNOSIS — R000 Tachycardia, unspecified: Secondary | ICD-10-CM

## 2012-01-08 DIAGNOSIS — E782 Mixed hyperlipidemia: Secondary | ICD-10-CM

## 2012-01-08 DIAGNOSIS — M899 Disorder of bone, unspecified: Secondary | ICD-10-CM | POA: Diagnosis not present

## 2012-01-08 DIAGNOSIS — Z0000 Encounter for general adult medical examination without abnormal findings: Secondary | ICD-10-CM

## 2012-01-08 DIAGNOSIS — R32 Unspecified urinary incontinence: Secondary | ICD-10-CM

## 2012-01-08 LAB — COMPREHENSIVE METABOLIC PANEL
Albumin: 4.4 g/dL (ref 3.5–5.2)
BUN: 15 mg/dL (ref 6–23)
Calcium: 9.4 mg/dL (ref 8.4–10.5)
Chloride: 104 mEq/L (ref 96–112)
Glucose, Bld: 82 mg/dL (ref 70–99)
Potassium: 4.1 mEq/L (ref 3.5–5.3)

## 2012-01-08 LAB — CBC WITH DIFFERENTIAL/PLATELET
Basophils Relative: 1 % (ref 0–1)
Hemoglobin: 14 g/dL (ref 12.0–15.0)
Lymphs Abs: 1 10*3/uL (ref 0.7–4.0)
MCHC: 34.8 g/dL (ref 30.0–36.0)
Monocytes Relative: 13 % — ABNORMAL HIGH (ref 3–12)
Neutro Abs: 2 10*3/uL (ref 1.7–7.7)
Neutrophils Relative %: 57 % (ref 43–77)
Platelets: 239 10*3/uL (ref 150–400)
RBC: 4.19 MIL/uL (ref 3.87–5.11)

## 2012-01-08 LAB — POCT URINALYSIS DIPSTICK
Ketones, UA: NEGATIVE
Protein, UA: NEGATIVE
Spec Grav, UA: 1.01

## 2012-01-08 LAB — LIPID PANEL: Cholesterol: 209 mg/dL — ABNORMAL HIGH (ref 0–200)

## 2012-01-08 LAB — TSH: TSH: 2.112 u[IU]/mL (ref 0.350–4.500)

## 2012-01-08 MED ORDER — INFLUENZA VIRUS VACC SPLIT PF IM SUSP: 0.5000 mL | Freq: Once | INTRAMUSCULAR | Status: DC

## 2012-01-08 NOTE — Progress Notes (Signed)
Chief Complaint  Patient presents with  . Annual Exam    fasting CPE with pelvic exam and med check. States when he is walking she feels like her heart rate speeds up and she get very tired.  Also states she is still having bladder problems. UA showed 1+ leuks.   Abigail Day is a 78 y.o. female who presents for a complete physical. She is also here for med check, due for labs. She has the following concerns:  Notices her heart pounding sometimes when she is just sitting in the evenings.  She once took her BP while feeling that way, and pulse was 200.  The BP was maybe 5 points higher than her usual blood pressure.  Lasted about 10 minutes at the most.  She has had symptoms off and on for about 2 weeks, with at least 4 episodes.  Once occurred while going up the stairs, and seemed to calm down when she sat down and relaxed.  Has discomfort in jaw, neck and R arm during the episode.  She does aerobics at the [facility] and hasn't really noticed any problems with exercise.    BP's at home usually runs 130/70's. Hyperlipidemia follow-up:  Patient is reportedly following a low-fat, low cholesterol diet.  Compliant with medications and denies medication side effects.  Complains of muscle and joint aches, and stiffness only over the last 2 months.  Osteopenia--previously took [place].  She stopped it after reading about risks in the paper (on her own), and so just changed over to Vitamin D.  Last DEXA 06/2009.  Health Maintenance: Immunization History  Administered Date(s) Administered  . Influenza Split 01/08/2011, 01/08/2012  . Pneumococcal Conjugate 01/31/2002  . Td 10/01/2004  pt had shingles vaccine about 3 years ago Last Pap smear: n/a Last mammogram: scheduled for later this month Last colonoscopy: 09/2010 Last DEXA: 06/2009 Ophtho: every 6 months Dentist: every 9 months Exercise: 3 days/week at the [facility], plus walks daily  Past Medical History  Diagnosis Date  . Pure hypercholesterolemia   .  Osteopenia   . Diverticulosis   . Urge urinary incontinence   . Hemorrhoids     internal and external  . Glaucoma suspect   . Unspecified hypothyroidism     s/p ablation for hyperthyroidism  . Hyperplastic colon polyp 09/25/10    Dr. Ewing Schlein  . Hemorrhoids 09/25/10    internal and external    Past Surgical History  Procedure Date  . Abdominal hysterectomy age 36    with BSO and bladder tack  . Eye surgery     bilateral cataract surgery  . Colonoscopy 11/06, 09/25/10    hyperplastic polyp 2012  . Laparoscopic nissen fundoplication 8/07    Dr. Daphine Deutscher  . Esophagogastroduodenoscopy 09/25/10    tortuous esophagus, intact Nissen fundoplication, dilated the spasm at esophageal GE junction    History   Social History  . Marital Status: Married    Spouse Name: N/A    Number of Children: 2  . Years of Education: N/A   Occupational History  . Not on file.   Social History Main Topics  . Smoking status: Never Smoker   . Smokeless tobacco: Never Used  . Alcohol Use: Yes     Comment: wine, 1-2 glasses 1-2 times a month  . Drug Use: No  . Sexually Active: Not on file   Other Topics Concern  . Not on file   Social History Narrative   Lives with husband. 2 sons--one in [place],  one in [place].  3 grandchildren.    Family History  Problem Relation Age of Onset  . Heart disease Mother   . Heart disease Father   . Diabetes Father   . Marfan syndrome Sister   . Heart disease Sister     valve replacement  . Heart disease Brother   . Diabetes Brother   . Heart disease Brother   . Diabetes Brother   . Heart disease Brother   . Heart disease Brother   . Heart disease Brother     CABG  . Heart disease Brother     CABG  . Heart disease Sister     pacemaker  . Cancer Sister 4    breast cancer  . Aortic aneurysm Sister   . Cancer Sister 74    colon cancer  . Heart disease Sister     CABG    Current outpatient prescriptions:aspirin 81 MG tablet, Take 81 mg by mouth  daily.  , Disp: , Rfl: ;  Calcium Carbonate-Vitamin D (CALCIUM 600 + D PO), Take 2 tablets by mouth daily.  , Disp: , Rfl: ;  Cholecalciferol (VITAMIN D) 1000 UNITS capsule, Take 1,000 Units by mouth daily.  , Disp: , Rfl: ;  co-enzyme Q-10 30 MG capsule, Take 30 mg by mouth daily.  , Disp: , Rfl:  glucosamine-chondroitin 500-400 MG tablet, Take 1 tablet by mouth daily.  , Disp: , Rfl: ;  levothyroxine (SYNTHROID, LEVOTHROID) 75 MCG tablet, TAKE ONE TABLET BY MOUTH EVERY DAY, Disp: 90 tablet, Rfl: 0;  Multiple Vitamin (MULTI-VITAMIN) tablet, Take 1 tablet by mouth daily.  , Disp: , Rfl: ;  pravastatin (PRAVACHOL) 40 MG tablet, TAKE ONE TABLET BY MOUTH DAILY, Disp: 30 tablet, Rfl: 5 [DISCONTINUED] pravastatin (PRAVACHOL) 40 MG tablet, TAKE ONE TABLET BY MOUTH DAILY., Disp: 90 tablet, Rfl: 0 Current facility-administered medications:influenza  inactive virus vaccine (FLUZONE/FLUARIX) injection 0.5 mL, 0.5 mL, Intramuscular, Once, Joselyn Arrow, MD  No Known Allergies  ROS:  The patient denies anorexia, fever, weight changes, headaches,  vision changes, decreased hearing, ear pain, sore throat, breast concerns, chest pain, dizziness, syncope, dyspnea on exertion, cough, swelling, nausea, vomiting, diarrhea, constipation, abdominal pain, melena, hematochezia, indigestion/heartburn, hematuria, vaginal bleeding, discharge, odor or itch, genital lesions, numbness, tingling, weakness, tremor, suspicious skin lesions, depression, anxiety, abnormal bleeding/bruising, or enlarged lymph nodes.  Uses humidifier at night, or else is very congested in mornings. Has some intermittent R flank pain, and odor to urine (comes/goes).  Has frequent urination at night, sometimes up to 5x/night.  Doesn't have the urge, but when she stands up she has leakage.  Feels like she doesn't empty well. Symptoms have resolved when treated for UTI's in the past. Cranberry tablets seem to help. Rash under breast related to heat/sweating,  responds to OTC antifungal meds prn Occasional hemorrhoid and constipation   PHYSICAL EXAM: BP 158/90  Pulse 72  Ht 4\' 9"  (1.448 m)  Wt 124 lb (56.246 kg)  BMI 26.83 kg/m2 162/92 on repeat by MD LA  General Appearance:    Alert, cooperative, no distress, appears stated age  Head:    Normocephalic, without obvious abnormality, atraumatic  Eyes:    PERRL, conjunctiva/corneas clear, EOM's intact, fundi    benign  Ears:    Normal TM's and external ear canals  Nose:   Nares normal, mucosa normal, no drainage or sinus   tenderness  Throat:   Lips, mucosa, and tongue normal; teeth and gums normal.  Upper dentures and  partials on lower.  Neck:   Supple, no lymphadenopathy;  thyroid:  no   enlargement/tenderness/nodules; no carotid   bruit or JVD  Back:    Spine nontender;  ROM normal.  Mild R CVA tenderness. +kyphosis and scoliosis.  Lungs:     Clear to auscultation bilaterally without wheezes, rales or     ronchi; respirations unlabored  Chest Wall:    No tenderness or deformity   Heart:    Regular rate and rhythm, S1 and S2 normal, no murmur, rub   or gallop  Breast Exam:    No tenderness, masses, or nipple discharge or inversion.      No axillary lymphadenopathy  Abdomen:     Soft, nondistended, normoactive bowel sounds,    no masses, no hepatosplenomegaly. Mild suprapubic tenderness.  Genitalia:    Normal external genitalia without lesions.  BUS and vagina normal; No abnormal vaginal discharge.  Uterus and adnexa surgically absent, no masses.  Hard stool palpable posteriorly in vault. Pap not performed  Rectal:    Normal tone, no masses or tenderness; guaiac negative stool  Extremities:   No clubbing, cyanosis or edema  Pulses:   2+ and symmetric all extremities  Skin:   Skin color, texture, turgor normal, no rashes or lesions  Lymph nodes:   Cervical, supraclavicular, and axillary nodes normal  Neurologic:   CNII-XII intact, normal strength, sensation and gait; reflexes 2+ and  symmetric throughout          Psych:   Normal mood, affect, hygiene and grooming.     ASSESSMENT/PLAN:  1. Routine general medical examination at a health care facility  POCT Urinalysis Dipstick, Visual acuity screening  2. Need for prophylactic vaccination and inoculation against influenza  influenza  inactive virus vaccine (FLUZONE/FLUARIX) injection 0.5 mL  3. Unspecified hypothyroidism  TSH  4. Mixed hyperlipidemia  Lipid panel, Comprehensive metabolic panel  5. Need for Tdap vaccination  Tdap vaccine greater than or equal to 7yo IM  6. Need for pneumococcal vaccination  Pneumococcal polysaccharide vaccine 23-valent greater than or equal to 2yo subcutaneous/IM  7. Urinary incontinence  Urine culture  8. Osteopenia  DG Bone Density  9. Tachycardia  Comprehensive metabolic panel, CBC with Differential, TSH   Elevated BP--has always been normal in the past. Continue to monitor.  Low sodium diet.  Tachycardia--check labs.  If labs okay, needs referral to cardiology for further evaluation. Discussed differential diagnosis (atrial fibrillation, SVT, vs other arrhythmia).  Strong family h/o coronary disease.    Intertrigo (intermittently)--discussed how to keep dry, and prn antifungal meds.  Currently doing well.  Urinary symptoms.  1+ leuks--send for culture.  If no infection, then consider treatment for OAB.  Discussed monthly self breast exams and yearly mammograms after the age of 60; at least 30 minutes of aerobic activity at least 5 days/week; proper sunscreen use reviewed; healthy diet, including goals of calcium and vitamin D intake and alcohol recommendations (less than or equal to 1 drink/day) reviewed; regular seatbelt use; changing batteries in smoke detectors.  Immunization recommendations discussed--see below.  Colonoscopy recommendations reviewed, UTD.  Pneumovax, flu shot and TdaP today. She had zostavax in past--will try and get [place] the date.

## 2012-01-08 NOTE — Patient Instructions (Addendum)
HEALTH MAINTENANCE RECOMMENDATIONS:  It is recommended that you get at least 30 minutes of aerobic exercise at least 5 days/week (for weight loss, you may need as much as 60-90 minutes). This can be any activity that gets your heart rate up. This can be divided in 10-15 minute intervals if needed, but try and build up your endurance at least once a week.  Weight bearing exercise is also recommended twice weekly.  Eat a healthy diet with lots of vegetables, fruits and fiber.  "Colorful" foods have a lot of vitamins (ie green vegetables, tomatoes, red peppers, etc).  Limit sweet tea, regular sodas and alcoholic beverages, all of which has a lot of calories and sugar.  Up to 1 alcoholic drink daily may be beneficial for women (unless trying to lose weight, watch sugars).  Drink a lot of water.  Calcium recommendations are 1200-1500 mg daily (1500 mg for postmenopausal women or women without ovaries), and vitamin D 1000 IU daily.  This should be obtained from diet and/or supplements (vitamins), and calcium should not be taken all at once, but in divided doses.  Monthly self breast exams and yearly mammograms for women over the age of 35 is recommended.  Sunscreen of at least SPF 30 should be used on all sun-exposed parts of the skin when outside between the hours of 10 am and 4 pm (not just when at beach or pool, but even with exercise, golf, tennis, and yard work!)  Use a sunscreen that says "broad spectrum" so it covers both UVA and UVB rays, and make sure to reapply every 1-2 hours.  Remember to change the batteries in your smoke detectors when changing your clock times in the spring and fall.  Use your seat belt every time you are in a car, and please drive safely and not be distracted with cell phones and texting while driving.  Follow low sodium diet (see below).  Start checking your blood pressure (and pulse) regularly, and call/fax with the results if they remain >140/90.  You have always been  120-130/70-80, but higher today (160/90).  2 Gram Low Sodium Diet A 2 gram sodium diet restricts the amount of sodium in the diet to no more than 2 g or 2000 mg daily. Limiting the amount of sodium is often used to help lower blood pressure. It is important if you have heart, liver, or kidney problems. Many foods contain sodium for flavor and sometimes as a preservative. When the amount of sodium in a diet needs to be low, it is important to know what to look for when choosing foods and drinks. The following includes some information and guidelines to help make it easier for you to adapt to a low sodium diet. QUICK TIPS  Do not add salt to food.  Avoid convenience items and fast food.  Choose unsalted snack foods.  Buy lower sodium products, often labeled as "lower sodium" or "no salt added."  Check food labels to learn how much sodium is in 1 serving.  When eating at a restaurant, ask that your food be prepared with less salt or none, if possible. READING FOOD LABELS FOR SODIUM INFORMATION The nutrition facts label is a good place to find how much sodium is in foods. Look for products with no more than 500 to 600 mg of sodium per meal and no more than 150 mg per serving. Remember that 2 g = 2000 mg. The food label may also list foods as:  Sodium-free: Less than  5 mg in a serving.  Very low sodium: 35 mg or less in a serving.  Low-sodium: 140 mg or less in a serving.  Light in sodium: 50% less sodium in a serving. For example, if a food that usually has 300 mg of sodium is changed to become light in sodium, it will have 150 mg of sodium.  Reduced sodium: 25% less sodium in a serving. For example, if a food that usually has 400 mg of sodium is changed to reduced sodium, it will have 300 mg of sodium. CHOOSING FOODS Grains  Avoid: Salted crackers and snack items. Some cereals, including instant hot cereals. Bread stuffing and biscuit mixes. Seasoned rice or pasta mixes.  Choose:  Unsalted snack items. Low-sodium cereals, oats, puffed wheat and rice, shredded wheat. English muffins and bread. Pasta. Meats  Avoid: Salted, canned, smoked, spiced, pickled meats, including fish and poultry. Bacon, ham, sausage, cold cuts, hot dogs, anchovies.  Choose: Low-sodium canned tuna and salmon. Fresh or frozen meat, poultry, and fish. Dairy  Avoid: Processed cheese and spreads. Cottage cheese. Buttermilk and condensed milk. Regular cheese.  Choose: Milk. Low-sodium cottage cheese. Yogurt. Sour cream. Low-sodium cheese. Fruits and Vegetables  Avoid: Regular canned vegetables. Regular canned tomato sauce and paste. Frozen vegetables in sauces. Olives. Rosita Fire. Relishes. Sauerkraut.  Choose: Low-sodium canned vegetables. Low-sodium tomato sauce and paste. Frozen or fresh vegetables. Fresh and frozen fruit. Condiments  Avoid: Canned and packaged gravies. Worcestershire sauce. Tartar sauce. Barbecue sauce. Soy sauce. Steak sauce. Ketchup. Onion, garlic, and table salt. Meat flavorings and tenderizers.  Choose: Fresh and dried herbs and spices. Low-sodium varieties of mustard and ketchup. Lemon juice. Tabasco sauce. Horseradish. SAMPLE 2 GRAM SODIUM MEAL PLAN Breakfast / Sodium (mg)  1 cup low-fat milk / 143 mg  2 slices whole-wheat toast / 270 mg  1 tbs heart-healthy margarine / 153 mg  1 hard-boiled egg / 139 mg  1 small orange / 0 mg Lunch / Sodium (mg)  1 cup raw carrots / 76 mg   cup hummus / 298 mg  1 cup low-fat milk / 143 mg   cup red grapes / 2 mg  1 whole-wheat pita bread / 356 mg Dinner / Sodium (mg)  1 cup whole-wheat pasta / 2 mg  1 cup low-sodium tomato sauce / 73 mg  3 oz lean ground beef / 57 mg  1 small side salad (1 cup raw spinach leaves,  cup cucumber,  cup yellow bell pepper) with 1 tsp olive oil and 1 tsp red wine vinegar / 25 mg Snack / Sodium (mg)  1 container low-fat vanilla yogurt / 107 mg  3 graham cracker squares / 127  mg Nutrient Analysis  Calories: 2033  Protein: 77 g  Carbohydrate: 282 g  Fat: 72 g  Sodium: 1971 mg Document Released: 02/17/2005 Document Revised: 05/12/2011 Document Reviewed: 05/21/2009 [employer] Patient Information 2013 [employer], [place].

## 2012-01-09 ENCOUNTER — Other Ambulatory Visit: Payer: Self-pay | Admitting: *Deleted

## 2012-01-09 DIAGNOSIS — Z79899 Other long term (current) drug therapy: Secondary | ICD-10-CM

## 2012-01-09 DIAGNOSIS — E782 Mixed hyperlipidemia: Secondary | ICD-10-CM

## 2012-01-09 MED ORDER — PRAVASTATIN SODIUM 40 MG PO TABS: 80.0000 mg | ORAL_TABLET | Freq: Every day | ORAL | Status: DC

## 2012-01-09 MED ORDER — LEVOTHYROXINE SODIUM 75 MCG PO TABS: 75.0000 ug | ORAL_TABLET | Freq: Every day | ORAL | Status: DC

## 2012-01-10 LAB — URINE CULTURE: Colony Count: 75000

## 2012-01-12 ENCOUNTER — Other Ambulatory Visit: Payer: Self-pay | Admitting: *Deleted

## 2012-01-12 DIAGNOSIS — N390 Urinary tract infection, site not specified: Secondary | ICD-10-CM

## 2012-01-12 MED ORDER — SULFAMETHOXAZOLE-TRIMETHOPRIM 800-160 MG PO TABS: 1.0000 | ORAL_TABLET | Freq: Two times a day (BID) | ORAL | Status: DC

## 2012-01-14 ENCOUNTER — Ambulatory Visit
Admission: RE | Admit: 2012-01-14 | Discharge: 2012-01-14 | Disposition: A | Discharge status: Home / Self Care | Payer: Medicare Other | Source: Ambulatory Visit | Attending: Family Medicine | Admitting: Family Medicine

## 2012-01-14 DIAGNOSIS — M8580 Other specified disorders of bone density and structure, unspecified site: Secondary | ICD-10-CM

## 2012-01-14 LAB — HM DEXA SCAN

## 2012-01-21 DIAGNOSIS — Z1231 Encounter for screening mammogram for malignant neoplasm of breast: Secondary | ICD-10-CM | POA: Diagnosis not present

## 2012-01-21 LAB — HM MAMMOGRAPHY

## 2012-02-18 ENCOUNTER — Ambulatory Visit (INDEPENDENT_AMBULATORY_CARE_PROVIDER_SITE_OTHER): Payer: Medicare Other | Admitting: Cardiology

## 2012-02-18 ENCOUNTER — Telehealth: Payer: Self-pay | Admitting: Physician Assistant

## 2012-02-18 ENCOUNTER — Encounter (INDEPENDENT_AMBULATORY_CARE_PROVIDER_SITE_OTHER): Payer: Medicare Other

## 2012-02-18 ENCOUNTER — Encounter: Payer: Self-pay | Admitting: Cardiology

## 2012-02-18 VITALS — BP 153/79 | HR 64 | Wt 124.0 lb

## 2012-02-18 DIAGNOSIS — R030 Elevated blood-pressure reading, without diagnosis of hypertension: Secondary | ICD-10-CM | POA: Diagnosis not present

## 2012-02-18 DIAGNOSIS — R0989 Other specified symptoms and signs involving the circulatory and respiratory systems: Secondary | ICD-10-CM | POA: Diagnosis not present

## 2012-02-18 DIAGNOSIS — R0600 Dyspnea, unspecified: Secondary | ICD-10-CM

## 2012-02-18 DIAGNOSIS — R0609 Other forms of dyspnea: Secondary | ICD-10-CM | POA: Diagnosis not present

## 2012-02-18 DIAGNOSIS — R002 Palpitations: Secondary | ICD-10-CM | POA: Diagnosis not present

## 2012-02-18 HISTORY — DX: Dyspnea, unspecified: R06.00

## 2012-02-18 HISTORY — DX: Other forms of dyspnea: R06.09

## 2012-02-18 HISTORY — DX: Palpitations: R00.2

## 2012-02-18 NOTE — Telephone Encounter (Addendum)
Pt of Dr. Elvis Coil had monitor placed today for palps/dyspnea. Received call from monitor company that pt had evidence of rapid afib rates 160s for >1 minute sustained. They attempted to contact pt but got no response. I called patient twice on both numbers listed and left messages to call back on both to find out how she is feeling. Will await response. Strips will be faxed to office per monitor company. Jonavon Trieu PA-C   UPDATE: Called patient. She was able to tell her heart rate was going fast earlier - felt the similar swimmyheaded palpitations she has been feeling. She now feels fine and was able to tell she's gone back into normal rhythm. No current complaints. No chest pain, SOB. I told her we would be contacting her with further instructions for her heart rhythm. She knows to go to the ER if her symptoms persist/worsen or if she feels poorly because of them. Margarete Horace PA-C

## 2012-02-18 NOTE — Patient Instructions (Signed)
We will have you wear an event monitor.  We will schedule you for an echocardiogram  I will see you in 4 weeks to review the results.  Keep track of your blood pressure readings.

## 2012-02-18 NOTE — Progress Notes (Signed)
Abigail Day Date of Birth: Jul 15, 1933 Medical Record #[id number]  History of Present Illness: Abigail Day is seen at the request of Dr. Lynelle Day for evaluation of palpitations. She is a pleasant 78 year old white female who has a history of Graves' disease. She is status post radioactive iodine ablation. She has been on chronic thyroid replacement with normal thyroid levels. She reports that over the past 4-5 months she has noticed symptoms of palpitations. This occurs every 2-3 days. She describes as a fluttering sensation in her chest and she notices this mostly at night. She also describes a several month history of dyspnea on exertion when she goes up steps or uphill. [facility]. Recently she has complained that her legs feel weak and that she has been more tired. Her blood pressure readings have been trending up a little bit. Her typical readings are about 140 systolic. She does have a history of hypercholesterolemia and strong family history of coronary disease. She has had no prior cardiac evaluation. She has no history of myocardial infarction or heart failure.  Current Outpatient Prescriptions on File Prior to Visit  Medication Sig Dispense Refill  . aspirin 81 MG tablet Take 81 mg by mouth daily.        . Calcium Carbonate-Vitamin D (CALCIUM 600 + D PO) Take 2 tablets by mouth daily.        . Cholecalciferol (VITAMIN D) 1000 UNITS capsule Take 1,000 Units by mouth daily.        Marland Kitchen co-enzyme Q-10 30 MG capsule Take 30 mg by mouth daily.        Marland Kitchen glucosamine-chondroitin 500-400 MG tablet Take 1 tablet by mouth daily.        Marland Kitchen levothyroxine (SYNTHROID, LEVOTHROID) 75 MCG tablet Take 1 tablet (75 mcg total) by mouth daily.  90 tablet  3  . Multiple Vitamin (MULTI-VITAMIN) tablet Take 1 tablet by mouth daily.        . pravastatin (PRAVACHOL) 40 MG tablet Take 2 tablets (80 mg total) by mouth daily.  180 tablet  0    No Known Allergies  Past Medical  History  Diagnosis Date  . Pure hypercholesterolemia   . Osteopenia   . Diverticulosis   . Urge urinary incontinence   . Hemorrhoids     internal and external  . Glaucoma suspect   . Unspecified hypothyroidism     s/p ablation for hyperthyroidism  . Hyperplastic colon polyp 09/25/10    Dr. Ewing Day  . Hemorrhoids 09/25/10    internal and external  . Palpitations 02/18/2012  . Dyspnea on exertion 02/18/2012    Past Surgical History  Procedure Date  . Abdominal hysterectomy age 78    with BSO and bladder tack  . Eye surgery     bilateral cataract surgery  . Colonoscopy 11/06, 09/25/10    hyperplastic polyp 2012  . Laparoscopic nissen fundoplication 8/07    Dr. Daphine Day  . Esophagogastroduodenoscopy 09/25/10    tortuous esophagus, intact Nissen fundoplication, dilated the spasm at esophageal GE junction    History  Smoking status  . Never Smoker   Smokeless tobacco  . Never Used    History  Alcohol Use  . Yes    Comment: wine, 1-2 glasses 1-2 times a month    Family History  Problem Relation Age of Onset  . Heart disease Mother   . Heart disease Father   . Diabetes Father   .  Marfan syndrome Sister   . Heart disease Sister     valve replacement  . Heart disease Brother   . Diabetes Brother   . Heart disease Brother   . Diabetes Brother   . Heart disease Brother   . Heart disease Brother   . Heart disease Brother     CABG  . Heart disease Brother     CABG  . Heart disease Sister     pacemaker  . Cancer Sister 62    breast cancer  . Aortic aneurysm Sister   . Cancer Sister 34    colon cancer  . Heart disease Sister     CABG    Review of Systems: The review of systems is positive for hypercholesterolemia. Recently her pravastatin was increased from 40-80 mg daily. All other systems were reviewed and are negative.  Physical Exam: BP 153/79  Pulse 64  Wt 124 lb (56.246 kg) She is a pleasant, overweight white female in no acute distress. HEENT:  Normocephalic, atraumatic. Pupils equal round and reactive light accommodation. Extraocular movements are full. Oropharynx is clear. Neck is supple without JVD, adenopathy, thyromegaly, or bruits. Lungs: Clear to auscultation and percussion. Cardiovascular: Regular rate and rhythm without gallop, murmur, or click. S1 and S2 are normal. Normal PMI. Abdomen: Soft and nontender. No masses or bruits. No hepatosplenomegaly. Bowel sounds are positive. Extremities: Normal femoral and pedal pulses. No cyanosis, clubbing, or edema. Skin: Warm and dry Neuro: Alert and oriented x3. Cranial nerves II through XII are intact. LABORATORY DATA: ECG demonstrates normal sinus rhythm with rightward axis. Otherwise normal. Blood work dated one month ago shows total cholesterol 209, triglycerides 181, HDL 51, LDL 122. Chemistry panel and CBC were unremarkable except for mildly depressed white blood count.  Assessment / Plan: 1. Palpitations. Given her history of thyroid disease she is at increased risk of atrial arrhythmias. We will have her wear an event monitor to try and document any potential arrhythmia.  2. Dyspnea on exertion. We will obtain an echocardiogram to assess her cardiac structure and function. Depending on the results of the above studies we may need to consider stress testing but we will await these results.  3. Hypercholesterolemia.  4. Strong family history of coronary disease.  5. Elevated blood pressure- I have asked that she monitor her blood pressure over the next month. I recommended sodium restriction. His blood pressure continues to rise may need to consider antihypertensive therapy.

## 2012-02-19 MED ORDER — METOPROLOL TARTRATE 25 MG PO TABS: 25.0000 mg | ORAL_TABLET | Freq: Two times a day (BID) | ORAL | Status: DC

## 2012-02-19 NOTE — Addendum Note (Signed)
Addended by: Meda Klinefelter D on: 02/19/2012 08:27 AM   Modules accepted: Orders

## 2012-02-19 NOTE — Telephone Encounter (Signed)
Patient called  received message from Dr.Jordan AFib noted on monitor.Advised to start metoprolol 25 mg twice a day.Prescription sent to [facility] [address].

## 2012-02-19 NOTE — Telephone Encounter (Signed)
AFib noted on monitor. Let's start her on metoprolol 25 mg bid. Continue to monitor.  Braedyn Kauk [name] MD, [facility]

## 2012-02-24 ENCOUNTER — Ambulatory Visit (HOSPITAL_COMMUNITY): Payer: Medicare Other | Attending: Cardiology

## 2012-02-24 DIAGNOSIS — I517 Cardiomegaly: Secondary | ICD-10-CM | POA: Insufficient documentation

## 2012-02-24 DIAGNOSIS — I079 Rheumatic tricuspid valve disease, unspecified: Secondary | ICD-10-CM | POA: Diagnosis not present

## 2012-02-24 DIAGNOSIS — R002 Palpitations: Secondary | ICD-10-CM

## 2012-02-24 DIAGNOSIS — E785 Hyperlipidemia, unspecified: Secondary | ICD-10-CM | POA: Diagnosis not present

## 2012-02-24 DIAGNOSIS — R0609 Other forms of dyspnea: Secondary | ICD-10-CM | POA: Diagnosis not present

## 2012-02-24 DIAGNOSIS — Z8249 Family history of ischemic heart disease and other diseases of the circulatory system: Secondary | ICD-10-CM | POA: Diagnosis not present

## 2012-02-24 DIAGNOSIS — R0989 Other specified symptoms and signs involving the circulatory and respiratory systems: Secondary | ICD-10-CM | POA: Insufficient documentation

## 2012-02-24 NOTE — Progress Notes (Signed)
Echocardiogram performed.  

## 2012-02-26 ENCOUNTER — Telehealth: Payer: Self-pay

## 2012-02-26 NOTE — Telephone Encounter (Signed)
LM for patient to Return call about her 48 hour monitor. [employer] sent a message via fax saying the event monitor did not receive a transmission from her monitor. Enrollment was 02-18-12.

## 2012-02-27 ENCOUNTER — Telehealth: Payer: Self-pay | Admitting: Cardiology

## 2012-02-27 NOTE — Telephone Encounter (Signed)
Message forwarded to treadmill/monitor room for them to address when they return.

## 2012-02-27 NOTE — Telephone Encounter (Signed)
Pt was given the following message from yesterday: "LM for patient to Return call about her 48 hour monitor. [employer] sent a message via fax saying the event monitor did not receive a transmission from her monitor. Enrollment was 02-18-12."  Pt states she is in [place], [place] until tonight.

## 2012-02-27 NOTE — Telephone Encounter (Signed)
New Problem:    Patient returned Gina's call.  Please call back.

## 2012-02-29 ENCOUNTER — Other Ambulatory Visit: Payer: Self-pay | Admitting: Family Medicine

## 2012-03-01 NOTE — Telephone Encounter (Signed)
Abigail Day placed an EVENT monitor on the patient 02/18/12.

## 2012-03-05 ENCOUNTER — Telehealth: Payer: Self-pay | Admitting: *Deleted

## 2012-03-05 NOTE — Telephone Encounter (Signed)
I spoke with patient about her event monitor. She is having skin breakdown from patches. Advised her to call the phone number on the box to obtain hypoallergenic patches. Rose has advised that she does not have any of these type patches in our clinic. She is scheduled to be monitored thru 1/18. She mentioned that her heart rate is in the 50's with some fatigue now that she is taking Metoprolol. Has appointment with Dr.Jordan this month. Will let him know that she called.

## 2012-03-09 NOTE — Telephone Encounter (Signed)
Patient called no answer.LMTC. 

## 2012-03-10 ENCOUNTER — Telehealth: Payer: Self-pay

## 2012-03-10 NOTE — Telephone Encounter (Signed)
Spoke to patient was told we received end of service report for monitor,which revealed atrial fib with rvr.Advised to continue metoprolol 25 mg twice a day,and keep appointment with Dr.Jordan 03/31/11.Patient stated she is still wearing monitor and is to mail back 03/21/11.Stated her skin has been breaking out from the electrodes.Stated [employer] mailed her electrodes for sensitive skin.

## 2012-03-10 NOTE — Telephone Encounter (Signed)
See previous 03/10/12 note.

## 2012-03-30 ENCOUNTER — Ambulatory Visit (INDEPENDENT_AMBULATORY_CARE_PROVIDER_SITE_OTHER): Payer: Medicare Other | Admitting: Cardiology

## 2012-03-30 ENCOUNTER — Encounter: Payer: Self-pay | Admitting: Cardiology

## 2012-03-30 VITALS — BP 133/69 | HR 53 | Ht <= 58 in | Wt 125.0 lb

## 2012-03-30 DIAGNOSIS — R030 Elevated blood-pressure reading, without diagnosis of hypertension: Secondary | ICD-10-CM | POA: Diagnosis not present

## 2012-03-30 DIAGNOSIS — E782 Mixed hyperlipidemia: Secondary | ICD-10-CM | POA: Diagnosis not present

## 2012-03-30 DIAGNOSIS — I4891 Unspecified atrial fibrillation: Secondary | ICD-10-CM | POA: Diagnosis not present

## 2012-03-30 MED ORDER — APIXABAN 5 MG PO TABS: 5.0000 mg | ORAL_TABLET | Freq: Two times a day (BID) | ORAL | Status: DC

## 2012-03-30 NOTE — Progress Notes (Signed)
Patriciaann Clan Brossman Date of Birth: June 27, 1933 Medical Record #[id number]  History of Present Illness: Mrs. Legler is seen for followup of her palpitations today. Her event monitor demonstrated episodes of atrial fibrillation with a rapid ventricular response with rates of 150-160 beats per minute. She was started on metoprolol. Her symptoms have improved significantly. She still feels a mild sensation at night that comes and goes. She denies any significant racing. She's had no dizziness. She does feel a little bit fatigued on metoprolol. The only time she gets out of breath is when she walks uphill. She has no history of TIA or stroke. No history of major bleeding.  Current Outpatient Prescriptions on File Prior to Visit  Medication Sig Dispense Refill  . Biotin 5000 MCG TABS Take 1 tablet by mouth daily.      . Calcium Carbonate-Vitamin D (CALCIUM 600 + D PO) Take 2 tablets by mouth daily.        . Cholecalciferol (VITAMIN D) 1000 UNITS capsule Take 1,000 Units by mouth daily.        Marland Kitchen co-enzyme Q-10 30 MG capsule Take 30 mg by mouth daily.        Marland Kitchen CRANBERRY PO Take 1 tablet by mouth 2 (two) times daily.      Marland Kitchen glucosamine-chondroitin 500-400 MG tablet Take 1 tablet by mouth daily.        Marland Kitchen levothyroxine (SYNTHROID, LEVOTHROID) 75 MCG tablet Take 1 tablet (75 mcg total) by mouth daily.  90 tablet  3  . metoprolol tartrate (LOPRESSOR) 25 MG tablet Take 1 tablet (25 mg total) by mouth 2 (two) times daily.  180 tablet  3  . Misc Natural Products (OSTEO BI-FLEX JOINT SHIELD PO) Take 2 tablets by mouth daily.      . Multiple Vitamin (MULTI-VITAMIN) tablet Take 1 tablet by mouth daily.        . Multiple Vitamins-Minerals (ICAPS PO) Take 1 tablet by mouth daily.      . pravastatin (PRAVACHOL) 40 MG tablet TAKE TWO TABLETS BY MOUTH DAILY.  180 tablet  1  . apixaban (ELIQUIS) 5 MG TABS tablet Take 1 tablet (5 mg total) by mouth 2 (two) times daily.  60 tablet  6    No Known Allergies  Past Medical History    Diagnosis Date  . Pure hypercholesterolemia   . Osteopenia   . Diverticulosis   . Urge urinary incontinence   . Hemorrhoids     internal and external  . Glaucoma suspect   . Unspecified hypothyroidism     s/p ablation for hyperthyroidism  . Hyperplastic colon polyp 09/25/10    Dr. Ewing Schlein  . Hemorrhoids 09/25/10    internal and external  . Palpitations 02/18/2012  . Dyspnea on exertion 02/18/2012  . Paroxysmal atrial fibrillation     Past Surgical History  Procedure Date  . Abdominal hysterectomy age 81    with BSO and bladder tack  . Eye surgery     bilateral cataract surgery  . Colonoscopy 11/06, 09/25/10    hyperplastic polyp 2012  . Laparoscopic nissen fundoplication 8/07    Dr. Daphine Deutscher  . Esophagogastroduodenoscopy 09/25/10    tortuous esophagus, intact Nissen fundoplication, dilated the spasm at esophageal GE junction    History  Smoking status  . Never Smoker   Smokeless tobacco  . Never Used    History  Alcohol Use  . Yes    Comment: wine, 1-2 glasses 1-2 times a month    Family History  Problem Relation Age of Onset  . Heart disease Mother   . Heart disease Father   . Diabetes Father   . Marfan syndrome Sister   . Heart disease Sister     valve replacement  . Heart disease Brother   . Diabetes Brother   . Heart disease Brother   . Diabetes Brother   . Heart disease Brother   . Heart disease Brother   . Heart disease Brother     CABG  . Heart disease Brother     CABG  . Heart disease Sister     pacemaker  . Cancer Sister 85    breast cancer  . Aortic aneurysm Sister   . Cancer Sister 24    colon cancer  . Heart disease Sister     CABG    Review of Systems: The review of systems is positive for hypercholesterolemia.  All other systems were reviewed and are negative.  Physical Exam: BP 133/69  Pulse 53  Ht 4\' 9"  (1.448 m)  Wt 125 lb (56.7 kg)  BMI 27.05 kg/m2 She is a pleasant, overweight white female in no acute distress. HEENT:  Normocephalic, atraumatic. Pupils equal round and reactive light accommodation. Extraocular movements are full. Oropharynx is clear. Neck is supple without JVD, adenopathy, thyromegaly, or bruits. Lungs: Clear to auscultation and percussion. Cardiovascular: Regular rate and rhythm without gallop, murmur, or click. S1 and S2 are normal. Normal PMI. Abdomen: Soft and nontender. No masses or bruits. No hepatosplenomegaly. Bowel sounds are positive. Extremities: Normal femoral and pedal pulses. No cyanosis, clubbing, or edema. Skin: Warm and dry Neuro: Alert and oriented x3. Cranial nerves II through XII are intact. LABORATORY DATA: Event monitor demonstrates atrial fibrillation with rapid ventricular response.  Transthoracic Echocardiography  Patient: Katianne, Barre MR #: [id number] Study Date: 02/24/2012 Gender: F Age: 79 Height: 149.9cm Weight: 56.2kg BSA: 1.70m^2 Pt. Status: Room:  ORDERING [name], Ethaniel Garfield REFERRING [name], Mandy Fitzwater ATTENDING Willa Rough PERFORMING [facility], [facility] SONOGRAPHER Philomena Course, RDCS cc:  ------------------------------------------------------------ LV EF: 60%  ------------------------------------------------------------ Indications: Dyspnea 786.09.  ------------------------------------------------------------ History: PMH: Acquired from the patient and from the patient's chart. Palpitations and dyspnea. Risk factors: Family history of coronary artery disease. Dyslipidemia.  ------------------------------------------------------------ Study Conclusions  - Left ventricle: The cavity size was normal. Wall thickness was normal. The estimated ejection fraction was 60%. Wall motion was normal; there were no regional wall motion abnormalities. - Mitral valve: Mild regurgitation. - Left atrium: The atrium was mildly dilated. - Right ventricle: The cavity size was normal. Systolic function was normal. Transthoracic echocardiography. M-mode, complete  2D, spectral Doppler, and color Doppler. Height: Height: 149.9cm. Height: 59in. Weight: Weight: 56.2kg. Weight: 123.7lb. Body mass index: BMI: 25kg/m^2. Body surface area: BSA: 1.48m^2. Blood pressure: 153/79. Patient status: Outpatient. Location: [facility]  ------------------------------------------------------------  ------------------------------------------------------------ Left ventricle: The cavity size was normal. Wall thickness was normal. The estimated ejection fraction was 60%. Wall motion was normal; there were no regional wall motion abnormalities.  ------------------------------------------------------------ Aortic valve: Structurally normal valve. Cusp separation was normal. Doppler: Transvalvular velocity was within the normal range. There was no stenosis. No regurgitation.  ------------------------------------------------------------ Aorta: Aortic root: The aortic root was normal in size.  ------------------------------------------------------------ Mitral valve: Structurally normal valve. Leaflet separation was normal. Doppler: Transvalvular velocity was within the normal range. There was no evidence for stenosis. Mild regurgitation. Peak gradient: 2mm Hg (D).  ------------------------------------------------------------ Left atrium: The atrium was mildly dilated.  ------------------------------------------------------------ Right ventricle: The cavity size was  normal. Systolic function was normal.  ------------------------------------------------------------ Pulmonic valve: The valve appears to be grossly normal. Doppler: No significant regurgitation.  ------------------------------------------------------------ Tricuspid valve: The valve appears to be grossly normal. Doppler: Mild regurgitation.  ------------------------------------------------------------  2D measurements Normal Doppler measurements Norma Left ventricle l LVID ED, 36.5 mm  43-52 Main pulmonary artery chord, Pressure, 30 mm Hg =30 PLAX S LVID ES, 24.5 mm 23-38 Left ventricle chord, Ea, lat 8.6 cm/s ----- PLAX ann, tiss 8 FS, chord, 33 % >29 DP PLAX E/Ea, lat 8.8 ----- LVPW, ED 9.16 mm ------ ann, tiss 1 IVS/LVPW 1.01 <1.3 DP ratio, ED Ea, med 7.2 cm/s ----- Ventricular septum ann, tiss 1 IVS, ED 9.29 mm ------ DP LVOT E/Ea, med 10. ----- Diam, S 20 mm ------ ann, tiss 61 Area 3.14 cm^2 ------ DP Diam 20 mm ------ LVOT Aorta Peak vel, 96. cm/s ----- Root diam, 27 mm ------ S 3 ED VTI, S 23. cm ----- Left atrium 7 AP dim 44 mm ------ HR 52 bpm ----- AP dim 2.93 cm/m^2 <2.2 Stroke vol 74. ml ----- index 5 Cardiac 3.9 L/min ----- output Cardiac 2.6 L/(min-m ----- index ^2) Stroke 49. ml/m^2 ----- index 6 Mitral valve Peak E vel 76. cm/s ----- 5 Peak A vel 64. cm/s ----- 2 Decelerati 197 ms 150-2 on time 30 Peak 2 mm Hg ----- gradient, D Peak E/A 1.2 ----- ratio Tricuspid valve Regurg 250 cm/s ----- peak vel Peak RV-RA 25 mm Hg ----- gradient, S Systemic veins Estimated 5 mm Hg ----- CVP Right ventricle Pressure, 30 mm Hg <30 S Sa vel, 11. cm/s ----- lat ann, 6 tiss DP Pulmonic valve Peak vel, 63. cm/s ----- S 7  ------------------------------------------------------------ Prepared and Electronically Authenticated by  Willa Rough 2013-12-24T11:26:[id number]  Assessment / Plan: 1. Paroxysmal atrial fibrillation. Symptoms improved on metoprolol. Based on her [place] score she is at moderate risk of CVA. I recommended anticoagulation. We discussed the options including Coumadin or the novel agents. I recommended starting her on Eliquis 5 mg twice a day. I recommended stopping aspirin and nonsteroidal anti-inflammatory medications. She is enrolled in the ORBIT trial. I will followup again in 3 months.  2. Dyspnea on exertion. Echocardiogram only shows mild mitral insufficiency and mild left atrial enlargement. LV function is  normal.  3. Hypercholesterolemia.  4. Strong family history of coronary disease.  5. Elevated blood pressure-improved on metoprolol.

## 2012-03-30 NOTE — Patient Instructions (Signed)
We will start you on Apixiban (Eliquis) 5 mg twice a day.  Do not take ASA or nonsteroidal anti-inflammatory drugs.  I will see you in 3 months.

## 2012-03-31 DIAGNOSIS — Z961 Presence of intraocular lens: Secondary | ICD-10-CM | POA: Diagnosis not present

## 2012-03-31 DIAGNOSIS — H3530 Unspecified macular degeneration: Secondary | ICD-10-CM | POA: Diagnosis not present

## 2012-03-31 DIAGNOSIS — H04129 Dry eye syndrome of unspecified lacrimal gland: Secondary | ICD-10-CM | POA: Diagnosis not present

## 2012-03-31 DIAGNOSIS — H40029 Open angle with borderline findings, high risk, unspecified eye: Secondary | ICD-10-CM | POA: Diagnosis not present

## 2012-04-01 ENCOUNTER — Telehealth: Payer: Self-pay | Admitting: Cardiology

## 2012-04-01 NOTE — Telephone Encounter (Signed)
Pt has dental appt for a cleaning only, on blood thinner , does she needs to go off?

## 2012-04-01 NOTE — Telephone Encounter (Signed)
Patient called was told Dr.Jordan advised does not have to hold eliquis for dental work.

## 2012-04-01 NOTE — Telephone Encounter (Signed)
She does not need to hold anticoagulants for dental work.  Peter [name] MD, [facility]

## 2012-04-01 NOTE — Telephone Encounter (Signed)
Patient called stated she will be having teeth cleaned 04/15/12,wants to know if she needs to hold eliquis.Message sent to Dr.Jordan for advice.

## 2012-04-05 ENCOUNTER — Encounter: Payer: Self-pay | Admitting: Family Medicine

## 2012-04-05 ENCOUNTER — Ambulatory Visit (INDEPENDENT_AMBULATORY_CARE_PROVIDER_SITE_OTHER): Payer: Medicare Other | Admitting: Family Medicine

## 2012-04-05 ENCOUNTER — Other Ambulatory Visit: Payer: Medicare Other | Admitting: Family Medicine

## 2012-04-05 VITALS — BP 158/88 | HR 56 | Ht <= 58 in | Wt 124.0 lb

## 2012-04-05 DIAGNOSIS — E782 Mixed hyperlipidemia: Secondary | ICD-10-CM | POA: Diagnosis not present

## 2012-04-05 DIAGNOSIS — Z79899 Other long term (current) drug therapy: Secondary | ICD-10-CM | POA: Diagnosis not present

## 2012-04-05 DIAGNOSIS — L309 Dermatitis, unspecified: Secondary | ICD-10-CM

## 2012-04-05 DIAGNOSIS — L259 Unspecified contact dermatitis, unspecified cause: Secondary | ICD-10-CM

## 2012-04-05 DIAGNOSIS — I4891 Unspecified atrial fibrillation: Secondary | ICD-10-CM | POA: Diagnosis not present

## 2012-04-05 LAB — LIPID PANEL
Cholesterol: 187 mg/dL (ref 0–200)
LDL Cholesterol: 91 mg/dL (ref 0–99)
VLDL: 46 mg/dL — ABNORMAL HIGH (ref 0–40)

## 2012-04-05 LAB — HEPATIC FUNCTION PANEL
ALT: 20 U/L (ref 0–35)
Albumin: 4.4 g/dL (ref 3.5–5.2)
Bilirubin, Direct: 0.1 mg/dL (ref 0.0–0.3)
Total Protein: 6.3 g/dL (ref 6.0–8.3)

## 2012-04-05 NOTE — Patient Instructions (Signed)
Continue to use hydrocortisone cream 2-3 times as needed for rash, for up to a week.  If rash becomes more widespread, consider that it may be allergic reaction to medication.  It currently looks like a contact dermatitis that should improve with cortisone creams.  You can use claritin or zyrtec to help with itching also, if needed

## 2012-04-05 NOTE — Progress Notes (Signed)
Chief Complaint  Patient presents with  . Rash    rash on left side of stomach area.   HPI: First noticed rash at areas of leads for event monitor, after wearing them for about 2-3 weeks.  She had the pads changed out to ones for "sensitive skin".  Completed the event monitor 1/16.  She has used cortisone creams since, and rash is almost completely better on both sides of upper chest.  Still has rash on L lower chest, but now has developed a rash on L side of abdomen that is very itchy.  Also has a spot on her back that is itchy.  This is below the area where the lead/pad was.  Denies new detergents, lotions, soaps or other new products, no new clothes/belts.  Has been using a medicated powder, which helps, but seems to be still spreading around to the back.   Had heart monitor for a month, which showed atrial fibrillation.  Had echocardiogram.  She was started on Eliquis and metoprolol.  Still has palpitations at night still, but not as fast.  Checked BP's for a month, and ranged 120-140/75-80, pulse stays around 56.  She isn't "tiring out" as much as before being on new meds, feeling better overall.  Hasn't taken her meds yet this morning.  Past Medical History  Diagnosis Date  . Pure hypercholesterolemia   . Osteopenia   . Diverticulosis   . Urge urinary incontinence   . Hemorrhoids     internal and external  . Glaucoma suspect   . Unspecified hypothyroidism     s/p ablation for hyperthyroidism  . Hyperplastic colon polyp 09/25/10    Dr. Ewing Schlein  . Hemorrhoids 09/25/10    internal and external  . Palpitations 02/18/2012  . Dyspnea on exertion 02/18/2012  . Paroxysmal atrial fibrillation    History   Social History  . Marital Status: Married    Spouse Name: N/A    Number of Children: 2  . Years of Education: N/A   Occupational History  . Not on file.   Social History Main Topics  . Smoking status: Never Smoker   . Smokeless tobacco: Never Used  . Alcohol Use: Yes   Comment: wine, 1-2 glasses 1-2 times a month  . Drug Use: No  . Sexually Active: Not on file   Other Topics Concern  . Not on file   Social History Narrative   Lives with husband. 2 sons--one in [place], one in [place].  3 grandchildren.   Current Outpatient Prescriptions on File Prior to Visit  Medication Sig Dispense Refill  . apixaban (ELIQUIS) 5 MG TABS tablet Take 1 tablet (5 mg total) by mouth 2 (two) times daily.  60 tablet  6  . Biotin 5000 MCG TABS Take 1 tablet by mouth daily.      . Calcium Carbonate-Vitamin D (CALCIUM 600 + D PO) Take 2 tablets by mouth daily.        . Cholecalciferol (VITAMIN D) 1000 UNITS capsule Take 1,000 Units by mouth daily.        Marland Kitchen co-enzyme Q-10 30 MG capsule Take 30 mg by mouth daily.        Marland Kitchen CRANBERRY PO Take 1 tablet by mouth 2 (two) times daily.      Marland Kitchen glucosamine-chondroitin 500-400 MG tablet Take 1 tablet by mouth daily.        Marland Kitchen levothyroxine (SYNTHROID, LEVOTHROID) 75 MCG tablet Take 1 tablet (75 mcg total) by mouth daily.  90  tablet  3  . metoprolol tartrate (LOPRESSOR) 25 MG tablet Take 1 tablet (25 mg total) by mouth 2 (two) times daily.  180 tablet  3  . Misc Natural Products (OSTEO BI-FLEX JOINT SHIELD PO) Take 2 tablets by mouth daily.      . Multiple Vitamin (MULTI-VITAMIN) tablet Take 1 tablet by mouth daily.        . Multiple Vitamins-Minerals (ICAPS PO) Take 1 tablet by mouth daily.      . pravastatin (PRAVACHOL) 40 MG tablet TAKE TWO TABLETS BY MOUTH DAILY.  180 tablet  1   No Known Allergies  ROS:  Denies fevers, chest pain, shortness of breath, GI complaints.  +rash, palpitations.  See HPI  PHYSICAL EXAM: BP 158/88  Pulse 56  Ht 4\' 9"  (1.448 m)  Wt 124 lb (56.246 kg)  BMI 26.83 kg/m2 Pleasant female, in no distress  L upper abdomen--patch centrally, and laterally (midway between sternum and umbilicus) there is raised, erythematous patch/papules, some of which are excoriated.  Similar appearing patch on her back, slightly  to the right of midline.  In front, between the two active areas, is an area of hyperpigmentation (healed/resolved area between the two active areas anteriorly) Slight hyperpigmentation below L breast Chest leads--no residual rash  ASSESSMENT/PLAN: 1. Dermatitis   2. Atrial fibrillation    Likely contact dermatitis. No evidence of fungal/yeast infection.  Continue OTC hydrocortisone cream BID, antihistamines prn for itching. Expect improvement in the next few days/week. Reassured that rash wasn't consistent with shingles (not vesicular or scabbing, crosses midline).  Afib, intermittent.  Some bradycardia due to beta blocker, but asymptomatic, and seems to be helping with rate control when she has atrial fib/tachycardia.  F/u with cardiology as planned.  Will contact pt with lab results (done today)--by cell phone as she will be out of town.  Answered all questions regarding bradycardia, meds.  Visit time 15 minutes. More than 1/2 spent counseling.

## 2012-04-06 ENCOUNTER — Other Ambulatory Visit: Payer: Medicare Other

## 2012-06-28 ENCOUNTER — Ambulatory Visit: Payer: Medicare Other | Admitting: Cardiology

## 2012-07-06 ENCOUNTER — Telehealth: Payer: Self-pay | Admitting: Family Medicine

## 2012-07-06 NOTE — Telephone Encounter (Signed)
FORM WAS DROPPED OFF TO BE COMPLETED. PT AND SPOUSE ARE WANTING TO MOVE TO ASSISTED LIVING AND THIS FORM PERTAINS TO THAT. SENDING BACK ON PT'S CHART TO BE ADDRESSED.

## 2012-07-21 ENCOUNTER — Telehealth: Payer: Self-pay | Admitting: Cardiology

## 2012-07-21 NOTE — Telephone Encounter (Signed)
New problem    C/O dizzy . Hard time walking.  blood pressure 119/64 taken at [facility]  This am.

## 2012-07-21 NOTE — Telephone Encounter (Signed)
Returned call to patient she stated she has been dizzy and legs weak for about 1 month.Stated she had B/P checked at [facility] this morning 119/64.No chest pain,no sob.Patient stated she would like to be seen.Appointment scheduled with Norma Fredrickson NP 07/23/12.

## 2012-07-23 ENCOUNTER — Encounter: Payer: Self-pay | Admitting: Nurse Practitioner

## 2012-07-23 ENCOUNTER — Ambulatory Visit (INDEPENDENT_AMBULATORY_CARE_PROVIDER_SITE_OTHER): Payer: Medicare Other | Admitting: Nurse Practitioner

## 2012-07-23 VITALS — BP 118/64 | HR 60 | Ht <= 58 in | Wt 124.0 lb

## 2012-07-23 DIAGNOSIS — R5383 Other fatigue: Secondary | ICD-10-CM | POA: Diagnosis not present

## 2012-07-23 DIAGNOSIS — R0989 Other specified symptoms and signs involving the circulatory and respiratory systems: Secondary | ICD-10-CM | POA: Diagnosis not present

## 2012-07-23 DIAGNOSIS — R5381 Other malaise: Secondary | ICD-10-CM

## 2012-07-23 DIAGNOSIS — R0609 Other forms of dyspnea: Secondary | ICD-10-CM | POA: Diagnosis not present

## 2012-07-23 DIAGNOSIS — R0681 Apnea, not elsewhere classified: Secondary | ICD-10-CM | POA: Diagnosis not present

## 2012-07-23 DIAGNOSIS — R0683 Snoring: Secondary | ICD-10-CM

## 2012-07-23 LAB — BASIC METABOLIC PANEL
BUN: 15 mg/dL (ref 6–23)
CO2: 27 mEq/L (ref 19–32)
Calcium: 9.4 mg/dL (ref 8.4–10.5)
Chloride: 103 mEq/L (ref 96–112)
Creatinine, Ser: 0.8 mg/dL (ref 0.4–1.2)
GFR: 70.81 mL/min (ref >60.00)
Glucose, Bld: 119 mg/dL — ABNORMAL HIGH (ref 70–99)
Potassium: 4.2 mEq/L (ref 3.5–5.1)
Sodium: 139 mEq/L (ref 135–145)

## 2012-07-23 LAB — TSH: TSH: 1.72 u[IU]/mL (ref 0.35–5.50)

## 2012-07-23 LAB — CBC WITH DIFFERENTIAL/PLATELET
Basophils Absolute: 0 10*3/uL (ref 0.0–0.1)
Basophils Relative: 0.5 % (ref 0.0–3.0)
Eosinophils Absolute: 0.1 10*3/uL (ref 0.0–0.7)
Eosinophils Relative: 1.5 % (ref 0.0–5.0)
HCT: 39.2 % (ref 36.0–46.0)
Hemoglobin: 13.4 g/dL (ref 12.0–15.0)
Lymphocytes Relative: 20.7 % (ref 12.0–46.0)
Lymphs Abs: 1.2 10*3/uL (ref 0.7–4.0)
MCHC: 34.3 g/dL (ref 30.0–36.0)
MCV: 99.1 fl (ref 78.0–100.0)
Monocytes Absolute: 0.6 10*3/uL (ref 0.1–1.0)
Monocytes Relative: 9.9 % (ref 3.0–12.0)
Neutro Abs: 3.8 10*3/uL (ref 1.4–7.7)
Neutrophils Relative %: 67.4 % (ref 43.0–77.0)
Platelets: 253 10*3/uL (ref 150.0–400.0)
RBC: 3.96 Mil/uL (ref 3.87–5.11)
RDW: 12.7 % (ref 11.5–14.6)
WBC: 5.6 10*3/uL (ref 4.5–10.5)

## 2012-07-23 NOTE — Progress Notes (Signed)
Patriciaann Clan Zelenak Date of Birth: [birth year] Medical Record #[id number]  History of Present Illness: Ms. Iannelli is seen back today for a work in visit. Seen for Dr. [name]. She has a history of atrial fib - on Eliquis. EF is normal per echo back in December of 2013. Other issues include HLD, hypothyroidism with prior ablation for hyperthyroidism, and diverticulosis.  Last seen here in January. Felt to be doing ok.   Called earlier this week to report some dizziness and legs feeling weak for about a month. BP was ok at 119/64.   Comes in today. She is here with her husband. She notes that when she wakes up in the mornings, she is quite fatigued. Her legs feel weak. Her heart rate has been in the 60's by her machine. She can hear her heart beating in her ears. She has been a long time snorer and husband and other family members have noted that she "stops breathing" at night. Sister on CPAP. She has no chest pain. She is not short of breath. [facility] and walks at home.   Current Outpatient Prescriptions on File Prior to Visit  Medication Sig Dispense Refill  . apixaban (ELIQUIS) 5 MG TABS tablet Take 1 tablet (5 mg total) by mouth 2 (two) times daily.  60 tablet  6  . Biotin 5000 MCG TABS Take 1 tablet by mouth daily.      . Calcium Carbonate-Vitamin D (CALCIUM 600 + D PO) Take 2 tablets by mouth daily.        . Cholecalciferol (VITAMIN D) 1000 UNITS capsule Take 1,000 Units by mouth daily.        Marland Kitchen co-enzyme Q-10 30 MG capsule Take 30 mg by mouth daily.        Marland Kitchen CRANBERRY PO Take 1 tablet by mouth 2 (two) times daily.      Marland Kitchen glucosamine-chondroitin 500-400 MG tablet Take 1 tablet by mouth daily.        Marland Kitchen levothyroxine (SYNTHROID, LEVOTHROID) 75 MCG tablet Take 1 tablet (75 mcg total) by mouth daily.  90 tablet  3  . metoprolol tartrate (LOPRESSOR) 25 MG tablet Take 1 tablet (25 mg total) by mouth 2 (two) times daily.  180 tablet  3  . Misc Natural Products (OSTEO BI-FLEX JOINT SHIELD  PO) Take 2 tablets by mouth daily.      . Multiple Vitamin (MULTI-VITAMIN) tablet Take 1 tablet by mouth daily.        . Multiple Vitamins-Minerals (ICAPS PO) Take 1 tablet by mouth daily.      . pravastatin (PRAVACHOL) 40 MG tablet TAKE TWO TABLETS BY MOUTH DAILY.  180 tablet  1   No current facility-administered medications on file prior to visit.    No Known Allergies  Past Medical History  Diagnosis Date  . Pure hypercholesterolemia   . Osteopenia   . Diverticulosis   . Urge urinary incontinence   . Hemorrhoids     internal and external  . Glaucoma suspect   . Unspecified hypothyroidism     s/p ablation for hyperthyroidism  . Hyperplastic colon polyp 09/25/10    Dr. Ewing Schlein  . Hemorrhoids 09/25/10    internal and external  . Palpitations 02/18/2012  . Dyspnea on exertion 02/18/2012  . Paroxysmal atrial fibrillation     Past Surgical History  Procedure Laterality Date  . Abdominal hysterectomy  age 79    with BSO and bladder tack  .  Eye surgery      bilateral cataract surgery  . Colonoscopy  11/06, 09/25/10    hyperplastic polyp 2012  . Laparoscopic nissen fundoplication  8/07    Dr. Daphine Deutscher  . Esophagogastroduodenoscopy  09/25/10    tortuous esophagus, intact Nissen fundoplication, dilated the spasm at esophageal GE junction    History  Smoking status  . Never Smoker   Smokeless tobacco  . Never Used    History  Alcohol Use  . Yes    Comment: wine, 1-2 glasses 1-2 times a month    Family History  Problem Relation Age of Onset  . Heart disease Mother   . Heart disease Father   . Diabetes Father   . Marfan syndrome Sister   . Heart disease Sister     valve replacement  . Heart disease Brother   . Diabetes Brother   . Heart disease Brother   . Diabetes Brother   . Heart disease Brother   . Heart disease Brother   . Heart disease Brother     CABG  . Heart disease Brother     CABG  . Heart disease Sister     pacemaker  . Cancer Sister 3    breast  cancer  . Aortic aneurysm Sister   . Cancer Sister 37    colon cancer  . Heart disease Sister     CABG    Review of Systems: The review of systems is per the HPI.  All other systems were reviewed and are negative.  Physical Exam: BP 118/64  Pulse 60  Ht 4\' 10"  (1.473 m)  Wt 124 lb (56.246 kg)  BMI 25.92 kg/m2 Patient is very pleasant and in no acute distress. Skin is warm and dry. Color is normal.  HEENT is unremarkable. Normocephalic/atraumatic. PERRL. Sclera are nonicteric. Neck is supple. No masses. No JVD. Lungs are clear. Cardiac exam shows a regular rate and rhythm. Abdomen is soft. Extremities are without edema. Gait and ROM are intact. No gross neurologic deficits noted.  LABORATORY DATA: Pending for today  Lab Results  Component Value Date   WBC 3.5* 01/08/2012   HGB 14.0 01/08/2012   HCT 40.2 01/08/2012   PLT 239 01/08/2012   GLUCOSE 82 01/08/2012   CHOL 187 04/05/2012   TRIG 230* 04/05/2012   HDL 50 04/05/2012   LDLCALC 91 04/05/2012   ALT 20 04/05/2012   AST 21 04/05/2012   NA 139 01/08/2012   K 4.1 01/08/2012   CL 104 01/08/2012   CREATININE 0.67 01/08/2012   BUN 15 01/08/2012   CO2 27 01/08/2012   TSH 2.112 01/08/2012     Assessment / Plan:  1. Weakness/morning fatigue - with long time snoring and apnea reported - probably related to OSA -  Will check baseline labs today and arrange for a sleep study. I have left her on her current regimen.   2. PAF - on beta blocker and Eliquis - she is in sinus by exam today. Heart rates stable at home in the 60's as well. I have left her on her current regimen.   She will tentatively see Dr. [name] back as planned in July.   Patient is agreeable to this plan and will call if any problems develop in the interim.   Rosalio Macadamia, RN, ANP-C [facility] [address] [place], [place]  [address]

## 2012-07-23 NOTE — Patient Instructions (Addendum)
I would like for you to have a split night sleep study  We need to check labs today  I am leaving you on your current medicines for now.  You do not need to see him in June but keep your July visit with Dr. [name]

## 2012-08-16 ENCOUNTER — Ambulatory Visit: Payer: Medicare Other | Admitting: Nurse Practitioner

## 2012-08-17 ENCOUNTER — Ambulatory Visit (HOSPITAL_BASED_OUTPATIENT_CLINIC_OR_DEPARTMENT_OTHER): Payer: Medicare Other | Attending: Nurse Practitioner

## 2012-08-17 VITALS — Ht <= 58 in | Wt 120.0 lb

## 2012-08-17 DIAGNOSIS — G4737 Central sleep apnea in conditions classified elsewhere: Secondary | ICD-10-CM | POA: Insufficient documentation

## 2012-08-17 DIAGNOSIS — G4733 Obstructive sleep apnea (adult) (pediatric): Secondary | ICD-10-CM | POA: Diagnosis not present

## 2012-08-17 DIAGNOSIS — R0681 Apnea, not elsewhere classified: Secondary | ICD-10-CM

## 2012-08-17 DIAGNOSIS — R5383 Other fatigue: Secondary | ICD-10-CM

## 2012-08-17 DIAGNOSIS — R0683 Snoring: Secondary | ICD-10-CM

## 2012-08-27 DIAGNOSIS — G4733 Obstructive sleep apnea (adult) (pediatric): Secondary | ICD-10-CM | POA: Diagnosis not present

## 2012-08-27 DIAGNOSIS — G4737 Central sleep apnea in conditions classified elsewhere: Secondary | ICD-10-CM | POA: Diagnosis not present

## 2012-08-27 NOTE — Procedures (Signed)
NAME:  Abigail Day, Abigail Day NO.:  [id number]  MEDICAL RECORD NO.:  [id number]          PATIENT TYPE:  OUT  LOCATION:  SLEEP CENTER                 FACILITY:  [facility]  PHYSICIAN:  Barbaraann Share, MD,FCCPDATE OF BIRTH:  01/11/36  DATE OF STUDY:  08/17/2012                           NOCTURNAL POLYSOMNOGRAM  REFERRING PHYSICIAN:  LORI C GERHARDT  INDICATIONS FOR STUDY:  Hypersomnia with sleep apnea.  EPWORTH SLEEPINESS SCORE:  5.  SLEEP ARCHITECTURE:  The patient had total sleep time of 249 minutes with very little slow-wave sleep and only 18 minutes of REM.  Sleep onset latency was normal at 13 minutes and REM onset was normal at 89 minutes.  Sleep efficiency was poor at 57%.  RESPIRATORY DATA:  The patient was found to have 46 obstructive and central apneas as well as 51 obstructive hypopneas, giving her an apnea- hypopnea index of 23 events per hour.  The events occurred in all body positions, and there was mild snoring noted throughout.  The patient did not meet split night criteria secondary to the majority of her events occurring well after 1:00 a.m.  OXYGEN DATA:  There was O2 desaturation as low as 82% with the patient's obstructive events.  CARDIAC DATA:  Patient was noted to have rare PVC, but no clinically significant arrhythmias were noted.  MOVEMENTS/PARASOMNIAS:  The patient had no significant leg jerks or other abnormal behavior seen.  IMPRESSION/RECOMMENDATIONS: 1. Moderate obstructive and central sleep apnea with an AHI of 23     events per hour and oxygen desaturation as low as 82%.  Treatment     for this degree of sleep apnea can include a trial of weight loss     alone, upper airway surgery, dental appliance, and also CPAP.     Clinical correlation is suggested. 2. Rare PVC noted, but no clinically significant arrhythmias were     seen.     Barbaraann Share, MD,FCCP Diplomate, American Board of Sleep Medicine    [facility]/MEDQ  D:   08/27/2012 08:20:27  T:  08/27/2012 22:26:51  Job:  [id number]

## 2012-08-30 ENCOUNTER — Telehealth: Payer: Self-pay | Admitting: *Deleted

## 2012-08-30 ENCOUNTER — Encounter: Payer: Self-pay | Admitting: Family Medicine

## 2012-08-30 ENCOUNTER — Other Ambulatory Visit: Payer: Self-pay | Admitting: *Deleted

## 2012-08-30 DIAGNOSIS — G4733 Obstructive sleep apnea (adult) (pediatric): Secondary | ICD-10-CM

## 2012-08-30 NOTE — Telephone Encounter (Signed)
Follow-up:    Patient called in returning the call.  Please call back.

## 2012-08-30 NOTE — Telephone Encounter (Signed)
S/w pt waiting to hear about sleep study results wants a special cpap machine. Pt was referred today to pulmonology, sent to scheduling to set up appointment

## 2012-08-30 NOTE — Telephone Encounter (Signed)
S.w pt thought that I call pt back I did not and stated that to pt. I let her know someome would be calling her back to schedule appt with Dr. Shelle Iron

## 2012-09-01 ENCOUNTER — Ambulatory Visit (INDEPENDENT_AMBULATORY_CARE_PROVIDER_SITE_OTHER): Payer: Medicare Other | Admitting: Cardiology

## 2012-09-01 ENCOUNTER — Encounter: Payer: Self-pay | Admitting: Cardiology

## 2012-09-01 ENCOUNTER — Other Ambulatory Visit: Payer: Self-pay

## 2012-09-01 VITALS — BP 118/70 | HR 66 | Ht <= 58 in | Wt 124.4 lb

## 2012-09-01 DIAGNOSIS — I4891 Unspecified atrial fibrillation: Secondary | ICD-10-CM | POA: Diagnosis not present

## 2012-09-01 DIAGNOSIS — G4733 Obstructive sleep apnea (adult) (pediatric): Secondary | ICD-10-CM | POA: Diagnosis not present

## 2012-09-01 MED ORDER — APIXABAN 5 MG PO TABS: 5.0000 mg | ORAL_TABLET | Freq: Two times a day (BID) | ORAL | Status: DC

## 2012-09-01 NOTE — Patient Instructions (Signed)
Continue your current medication.  Keep your appointment with pulmonary.  I will see you in 6 months.

## 2012-09-01 NOTE — Progress Notes (Signed)
Abigail Day Date of Birth: Aug 29, 1933 Medical Record #[id number]  History of Present Illness: Abigail Day is seen for a followup visit. She has a history of atrial fib - on Eliquis. EF is normal per echo back in December of 2013. Other issues include HLD, hypothyroidism with prior ablation for hyperthyroidism, and diverticulosis. She was seen here in May with complaints of increased fatigue and leg weakness. History of periods of apnea and snoring at night. Sleep study was performed and demonstrated moderate obstructive and central sleep apnea. She is scheduled to see pulmonary for evaluation. She denies any significant palpitations, dizziness, or shortness of breath.   Current Outpatient Prescriptions on File Prior to Visit  Medication Sig Dispense Refill  . Biotin 5000 MCG TABS Take 1 tablet by mouth daily.      . Calcium Carbonate-Vitamin D (CALCIUM 600 + D PO) Take 2 tablets by mouth daily.        . Cholecalciferol (VITAMIN D) 1000 UNITS capsule Take 1,000 Units by mouth daily.        Marland Kitchen co-enzyme Q-10 30 MG capsule Take 30 mg by mouth daily.        Marland Kitchen CRANBERRY PO Take 1 tablet by mouth 2 (two) times daily.      Marland Kitchen glucosamine-chondroitin 500-400 MG tablet Take 1 tablet by mouth daily.        Marland Kitchen levothyroxine (SYNTHROID, LEVOTHROID) 75 MCG tablet Take 1 tablet (75 mcg total) by mouth daily.  90 tablet  3  . metoprolol tartrate (LOPRESSOR) 25 MG tablet Take 1 tablet (25 mg total) by mouth 2 (two) times daily.  180 tablet  3  . Misc Natural Products (OSTEO BI-FLEX JOINT SHIELD PO) Take 2 tablets by mouth daily.      . Multiple Vitamin (MULTI-VITAMIN) tablet Take 1 tablet by mouth daily.        . Multiple Vitamins-Minerals (ICAPS PO) Take 1 tablet by mouth daily.      . pravastatin (PRAVACHOL) 40 MG tablet TAKE TWO TABLETS BY MOUTH DAILY.  180 tablet  1   No current facility-administered medications on file prior to visit.    No Known Allergies  Past Medical History  Diagnosis Date  . Pure  hypercholesterolemia   . Osteopenia   . Diverticulosis   . Urge urinary incontinence   . Hemorrhoids     internal and external  . Glaucoma suspect   . Unspecified hypothyroidism     s/p ablation for hyperthyroidism  . Hyperplastic colon polyp 09/25/10    Dr. Ewing Schlein  . Hemorrhoids 09/25/10    internal and external  . Palpitations 02/18/2012  . Dyspnea on exertion 02/18/2012  . Paroxysmal atrial fibrillation   . Sleep apnea     Past Surgical History  Procedure Laterality Date  . Abdominal hysterectomy  age 47    with BSO and bladder tack  . Eye surgery      bilateral cataract surgery  . Colonoscopy  11/06, 09/25/10    hyperplastic polyp 2012  . Laparoscopic nissen fundoplication  8/07    Dr. Daphine Deutscher  . Esophagogastroduodenoscopy  09/25/10    tortuous esophagus, intact Nissen fundoplication, dilated the spasm at esophageal GE junction    History  Smoking status  . Never Smoker   Smokeless tobacco  . Never Used    History  Alcohol Use  . Yes    Comment: wine, 1-2 glasses 1-2 times a month    Family History  Problem Relation Age of Onset  .  Heart disease Mother   . Heart disease Father   . Diabetes Father   . Marfan syndrome Sister   . Heart disease Sister     valve replacement  . Heart disease Brother   . Diabetes Brother   . Heart disease Brother   . Diabetes Brother   . Heart disease Brother   . Heart disease Brother   . Heart disease Brother     CABG  . Heart disease Brother     CABG  . Heart disease Sister     pacemaker  . Cancer Sister 80    breast cancer  . Aortic aneurysm Sister   . Cancer Sister 70    colon cancer  . Heart disease Sister     CABG    Review of Systems: The review of systems is per the HPI.  All other systems were reviewed and are negative.  Physical Exam: BP 118/70  Pulse 66  Ht 4\' 10"  (1.473 m)  Wt 124 lb 6.4 oz (56.427 kg)  BMI 26.01 kg/m2  SpO2 96% Patient is very pleasant and in no acute distress. Skin is warm and  dry. Color is normal.  HEENT is unremarkable. Normocephalic/atraumatic. PERRL. Sclera are nonicteric. Neck is supple. No masses. No JVD. Lungs are clear. Cardiac exam shows a regular rate and rhythm. Abdomen is soft. Extremities are without edema. Gait and ROM are intact. No gross neurologic deficits noted.  LABORATORY DATA: Pending for today  Lab Results  Component Value Date   WBC 5.6 07/23/2012   HGB 13.4 07/23/2012   HCT 39.2 07/23/2012   PLT 253.0 07/23/2012   GLUCOSE 119* 07/23/2012   CHOL 187 04/05/2012   TRIG 230* 04/05/2012   HDL 50 04/05/2012   LDLCALC 91 04/05/2012   ALT 20 04/05/2012   AST 21 04/05/2012   NA 139 07/23/2012   K 4.2 07/23/2012   CL 103 07/23/2012   CREATININE 0.8 07/23/2012   BUN 15 07/23/2012   CO2 27 07/23/2012   TSH 1.72 07/23/2012   ECG today demonstrates sinus bradycardia rate 57 beats per minute. It is otherwise normal.  Assessment / Plan:  1. Obstructive sleep apnea. We'll follow through with pulmonary evaluation.  2. PAF - on beta blocker and Eliquis - she is in sinus by exam today. I'll followup again in 6 months.

## 2012-09-10 ENCOUNTER — Encounter: Payer: Self-pay | Admitting: Pulmonary Disease

## 2012-09-10 ENCOUNTER — Ambulatory Visit (INDEPENDENT_AMBULATORY_CARE_PROVIDER_SITE_OTHER): Payer: Medicare Other | Admitting: Pulmonary Disease

## 2012-09-10 VITALS — BP 102/62 | HR 63 | Temp 98.5°F | Ht <= 58 in | Wt 124.6 lb

## 2012-09-10 DIAGNOSIS — G4733 Obstructive sleep apnea (adult) (pediatric): Secondary | ICD-10-CM | POA: Diagnosis not present

## 2012-09-10 NOTE — Progress Notes (Signed)
Subjective:    Patient ID: [name], female    DOB: Jul 20, 1933, 79 y.o.   MRN: [id number]  HPI  The patient is a 79 year old female who had been asked to see for management of obstructive sleep apnea.  She has had a recent sleep study that showed moderate obstructive and central sleep apnea, with an AHI of 23 events per hour.  The patient has been noted to have loud snoring, and also has had witnessed apneas and an abnormal breathing pattern during sleep.  She also notes occasional choking arousals.  She has frequent awakenings at night, and is not rested in the mornings upon arising.  She has definite sleep pressure during the day, and frequently takes a nap.  She also has some sleep pressure with driving.  The patient states that her weight is neutral over the last 2 years, and her Epworth score today is 10  Sleep Questionnaire What time do you typically go to bed?( Between what hours) 10p 10p at 1424 on 09/10/12 by Nita Sells, CMA How long does it take you to fall asleep?  at 1424 on 09/10/12 by Marjo Bicker Mabe, CMA How many times during the night do you wake up? 4 4 at 1424 on 09/10/12 by Nita Sells, CMA What time do you get out of bed to start your day? 0700 0700 at 1424 on 09/10/12 by Nita Sells, CMA Do you drive or operate heavy machinery in your occupation? No No at 1424 on 09/10/12 by Nita Sells, CMA How much has your weight changed (up or down) over the past two years? (In pounds) 0 oz (0 kg) 0 oz (0 kg) at 1424 on 09/10/12 by Nita Sells, CMA Have you ever had a sleep study before? Yes Yes at 1424 on 09/10/12 by Nita Sells, CMA If yes, location of study? [facility] at 1424 on 09/10/12 by Nita Sells, CMA If yes, date of study? 08/31/12 08/31/12 at 1424 on 09/10/12 by Nita Sells, CMA Do you currently use CPAP? No No at 1424 on 09/10/12 by Marjo Bicker Mabe, CMA Do you wear oxygen at any time? No No at 1424 on 09/10/12 by Marjo Bicker Mabe,  CMA   Review of Systems  Constitutional: Negative for fever and unexpected weight change.  HENT: Negative for ear pain, nosebleeds, congestion, sore throat, rhinorrhea, sneezing, trouble swallowing, dental problem, postnasal drip and sinus pressure.   Eyes: Negative for redness and itching.  Respiratory: Negative for cough, chest tightness, shortness of breath and wheezing.   Cardiovascular: Positive for palpitations ( irregular heartbeats // A-fib). Negative for leg swelling.  Gastrointestinal: Negative for nausea and vomiting.  Genitourinary: Negative for dysuria.  Musculoskeletal: Negative for joint swelling.  Skin: Negative for rash.  Neurological: Negative for headaches.  Hematological: Does not bruise/bleed easily.  Psychiatric/Behavioral: Negative for dysphoric mood. The patient is not nervous/anxious.        Objective:   Physical Exam Constitutional:  Ow female, no acute distress  HENT:  Nares patent without discharge, but narrowed bilat.   Oropharynx without exudate, palate and uvula are mildly elongated.   Eyes:  Perrla, eomi, no scleral icterus  Neck:  No JVD, no TMG  Cardiovascular:  Normal rate, regular rhythm, no rubs or gallops.  No murmurs        Intact distal pulses  Pulmonary :  Normal breath sounds, no stridor or respiratory distress   No rales, rhonchi, or wheezing  Abdominal:  Soft, nondistended, bowel sounds present.  No tenderness noted.   Musculoskeletal:  No lower extremity edema noted.  Lymph Nodes:  No cervical lymphadenopathy noted  Skin:  No cyanosis noted  Neurologic:  Alert, appropriate, moves all 4 extremities without obvious deficit.         Assessment & Plan:

## 2012-09-10 NOTE — Patient Instructions (Addendum)
Will start on cpap at a moderate pressure level.  Please call if you are having tolerance issues. Work on weight loss followup with me in 6 weeks.  

## 2012-09-10 NOTE — Assessment & Plan Note (Signed)
The patient has been diagnosed with moderate obstructive sleep apnea, and I have reviewed the pathophysiology with her and her husband as well as its impact to her quality of life and cardiovascular health.  I have discussed the possible treatment options, including weight loss, surgery, dental appliance, and also CPAP.  Given the severity of her sleep apnea, I think a dental appliance were CPAP while she is working on weight loss represent her best treatment options.  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  She is willing to proceed with a CPAP trial.  I will set the patient up on cpap at a moderate pressure level to allow for desensitization, and will troubleshoot the device over the next 4-6weeks if needed.  The pt is to call me if having issues with tolerance.  Will then optimize the pressure once patient is able to wear cpap on a consistent basis.

## 2012-09-15 ENCOUNTER — Telehealth: Payer: Self-pay | Admitting: Pulmonary Disease

## 2012-09-15 NOTE — Telephone Encounter (Signed)
Pt stated she still has not heard from [facility] regarding CPAP set up. I called Melissa to check on this. Melissa will call back

## 2012-09-15 NOTE — Telephone Encounter (Signed)
I spoke with Melissa and she has processed the order today so the pt should be getting a call soon about setting up an appt. Pt advised.Carron Curie, CMA

## 2012-09-23 ENCOUNTER — Institutional Professional Consult (permissible substitution): Payer: Medicare Other | Admitting: Pulmonary Disease

## 2012-09-24 ENCOUNTER — Institutional Professional Consult (permissible substitution): Payer: Medicare Other | Admitting: Pulmonary Disease

## 2012-10-16 ENCOUNTER — Other Ambulatory Visit: Payer: Self-pay | Admitting: Family Medicine

## 2012-10-18 ENCOUNTER — Telehealth: Payer: Self-pay | Admitting: *Deleted

## 2012-10-18 NOTE — Telephone Encounter (Signed)
Okay just to schedule CPE for January

## 2012-10-18 NOTE — Telephone Encounter (Signed)
I refilled patient's pravastatin this morning and noticed that she was due for med check ~ 10/03/12, and CPE after 01/08/12. I cannot get her a CPE until late January 2015, I was able to schedule med check for 12/30/12, that was the first available. Is this ok? Or do you have another recommendation?

## 2012-10-22 ENCOUNTER — Ambulatory Visit (INDEPENDENT_AMBULATORY_CARE_PROVIDER_SITE_OTHER): Payer: Medicare Other | Admitting: Pulmonary Disease

## 2012-10-22 ENCOUNTER — Encounter: Payer: Self-pay | Admitting: Pulmonary Disease

## 2012-10-22 VITALS — BP 104/60 | HR 55 | Temp 98.8°F | Ht <= 58 in | Wt 124.2 lb

## 2012-10-22 DIAGNOSIS — G4733 Obstructive sleep apnea (adult) (pediatric): Secondary | ICD-10-CM

## 2012-10-22 NOTE — Patient Instructions (Addendum)
Will have your machine put on the auto setting to determine your optimal pressure.  Will let you know the results. Work on weight loss followup with me in 6mos, but call if having issues with your machine.

## 2012-10-22 NOTE — Progress Notes (Signed)
  Subjective:    Patient ID: Abigail Day, female    DOB: 01-Aug-1933, 79 y.o.   MRN: [id number]  HPI Patient comes in today for followup of her obstructive sleep apnea.  She was started on CPAP at the last visit, he feels that it has helped her sleep and daytime alertness.  She is currently using nasal pillows with a chin strap, but has yet to have her pressure optimized.  Her husband states that her snoring has resolved as long as she wears the mask.   Review of Systems  Constitutional: Negative for fever and unexpected weight change.  HENT: Negative for ear pain, nosebleeds, congestion, sore throat, rhinorrhea, sneezing, trouble swallowing, dental problem, postnasal drip and sinus pressure.   Eyes: Negative for redness and itching.  Respiratory: Negative for cough, chest tightness, shortness of breath and wheezing.   Cardiovascular: Negative for palpitations and leg swelling.  Gastrointestinal: Negative for nausea and vomiting.  Genitourinary: Negative for dysuria.  Musculoskeletal: Negative for joint swelling.  Skin: Negative for rash.  Neurological: Negative for headaches.  Hematological: Does not bruise/bleed easily.  Psychiatric/Behavioral: Negative for dysphoric mood. The patient is not nervous/anxious.        Objective:   Physical Exam Overweight female in no acute distress Nose without purulence or discharge noted No skin breakdown or pressure necrosis from CPAP mask Neck without lymphadenopathy or thyromegaly Lower extremities without edema, cyanosis Alert, does not appear to be sleepy, moves all 4 extremities.       Assessment & Plan:

## 2012-10-22 NOTE — Assessment & Plan Note (Signed)
The patient is doing fairly well with CPAP, but currently needs to have her pressure optimized.  We used the automatic setting of her device, and we'll let her know the results once I receive the download.  I have stressed her the importance of aggressive weight loss, and keeping up with her mask changes and supplies.

## 2012-11-13 ENCOUNTER — Other Ambulatory Visit: Payer: Self-pay | Admitting: Pulmonary Disease

## 2012-11-13 DIAGNOSIS — G4733 Obstructive sleep apnea (adult) (pediatric): Secondary | ICD-10-CM

## 2012-12-08 ENCOUNTER — Ambulatory Visit (INDEPENDENT_AMBULATORY_CARE_PROVIDER_SITE_OTHER): Payer: Medicare Other | Admitting: Family Medicine

## 2012-12-08 ENCOUNTER — Encounter: Payer: Self-pay | Admitting: Family Medicine

## 2012-12-08 VITALS — BP 128/70 | HR 60 | Ht <= 58 in | Wt 124.0 lb

## 2012-12-08 DIAGNOSIS — B372 Candidiasis of skin and nail: Secondary | ICD-10-CM | POA: Diagnosis not present

## 2012-12-08 DIAGNOSIS — Z23 Encounter for immunization: Secondary | ICD-10-CM | POA: Diagnosis not present

## 2012-12-08 NOTE — Patient Instructions (Addendum)
Add an antifungal cream--ie. lotrimin (clotrimazole) or Lamisil cream, twice daily for up 2-3 weeks.  Continue to use powder (ie cornstarch, or medicated powder) to help absorb moisture and keep the area dry. You may use the fluocinonide cream if needed for severe itching, but this should be in addition to the antifungal cream. Try and keep the area under the breast as dry as possible--use blow dryer after bathing; change to clean/dry bra if bra is getting wet due to perspiration.  Cotton bras are better in absorbing moisture   Intertrigo Intertrigo is a skin condition that occurs in between folds of skin in places on the body that rub together a lot and do not get much ventilation. It is caused by heat, moisture, friction, sweat retention, and lack of air circulation, which produces red, irritated patches and, sometimes, scaling or drainage. People who have diabetes, who are obese, or who have treatment with antibiotics are at increased risk for intertrigo. The most common sites for intertrigo to occur include:  The groin.  The breasts.  The armpits.  Folds of abdominal skin.  Webbed spaces between the fingers or toes. Intertrigo may be aggravated by:  Sweat.  Feces.  Yeast or bacteria that are present near skin folds.  Urine.  Vaginal discharge. HOME CARE INSTRUCTIONS  The following steps can be taken to reduce friction and keep the affected area cool and dry:  Expose skin folds to the air.  Keep deep skin folds separated with cotton or linen cloth. Avoid tight fitting clothing that could cause chafing.  Wear open-toed shoes or sandals to help reduce moisture between the toes.  Apply absorbent powders to affected areas as directed by your caregiver.  Apply over-the-counter barrier pastes, such as zinc oxide, as directed by your caregiver.  If you develop a fungal infection in the affected area, your caregiver may have you use antifungal creams. SEEK MEDICAL CARE IF:    The rash is not improving after 1 week of treatment.  The rash is getting worse (more red, more swollen, more painful, or spreading).  You have a fever or chills. MAKE SURE YOU:   Understand these instructions.  Will watch your condition.  Will get help right away if you are not doing well or get worse. Document Released: 02/17/2005 Document Revised: 05/12/2011 Document Reviewed: 08/02/2009 [facility] Patient Information 2014 [place], [place].

## 2012-12-08 NOTE — Progress Notes (Signed)
Chief Complaint  Patient presents with  . Rash    has a rash under both of her breasts, has had all summer long. Saw Dr.Luptons PA 07/30/12 and was given fluocinonide, worked some but does not feel that rash is gone.    Had similar problem on her abdomen--seen here and then by Dr. Terri Piedra.  She was prescribed fluocinonide and rash resolved, but rash has been under her breast all summer long.  It gets sweaty and itchy.  She tried using the fluocinonide for 3-4 months. It clears up some, but never completely resolves.  Past Medical History  Diagnosis Date  . Pure hypercholesterolemia   . Osteopenia   . Diverticulosis   . Urge urinary incontinence   . Hemorrhoids     internal and external  . Glaucoma suspect   . Unspecified hypothyroidism     s/p ablation for hyperthyroidism  . Hyperplastic colon polyp 09/25/10    Dr. Ewing Schlein  . Hemorrhoids 09/25/10    internal and external  . Palpitations 02/18/2012  . Dyspnea on exertion 02/18/2012  . Paroxysmal atrial fibrillation   . Sleep apnea    Past Surgical History  Procedure Laterality Date  . Abdominal hysterectomy  age 11    with BSO and bladder tack  . Eye surgery      bilateral cataract surgery  . Colonoscopy  11/06, 09/25/10    hyperplastic polyp 2012  . Laparoscopic nissen fundoplication  8/07    Dr. Daphine Deutscher  . Esophagogastroduodenoscopy  09/25/10    tortuous esophagus, intact Nissen fundoplication, dilated the spasm at esophageal GE junction   History   Social History  . Marital Status: Married    Spouse Name: N/A    Number of Children: 2  . Years of Education: N/A   Occupational History  . retired    Social History Main Topics  . Smoking status: Never Smoker   . Smokeless tobacco: Never Used  . Alcohol Use: Yes     Comment: wine, 1-2 glasses 1-2 times a month  . Drug Use: No  . Sexual Activity: Not Currently   Other Topics Concern  . Not on file   Social History Narrative   Lives with husband. 2 sons--one in [place],  one in [place].  3 grandchildren.   Current outpatient prescriptions:apixaban (ELIQUIS) 5 MG TABS tablet, Take 1 tablet (5 mg total) by mouth 2 (two) times daily., Disp: 180 tablet, Rfl: 3;  Calcium Carbonate-Vitamin D (CALCIUM 600 + D PO), Take 2 tablets by mouth daily.  , Disp: , Rfl: ;  Cholecalciferol (VITAMIN D) 1000 UNITS capsule, Take 1,000 Units by mouth daily.  , Disp: , Rfl: ;  co-enzyme Q-10 30 MG capsule, Take 30 mg by mouth daily.  , Disp: , Rfl:  levothyroxine (SYNTHROID, LEVOTHROID) 75 MCG tablet, Take 1 tablet (75 mcg total) by mouth daily., Disp: 90 tablet, Rfl: 3;  metoprolol tartrate (LOPRESSOR) 25 MG tablet, Take 1 tablet (25 mg total) by mouth 2 (two) times daily., Disp: 180 tablet, Rfl: 3;  Misc Natural Products (OSTEO BI-FLEX JOINT SHIELD PO), Take 2 tablets by mouth daily., Disp: , Rfl: ;  Multiple Vitamin (MULTI-VITAMIN) tablet, Take 1 tablet by mouth daily.  , Disp: , Rfl:  Multiple Vitamins-Minerals (ICAPS PO), Take 1 tablet by mouth daily., Disp: , Rfl: ;  pravastatin (PRAVACHOL) 40 MG tablet, TAKE TWO TABLETS BY MOUTH EVERY DAY, Disp: 180 tablet, Rfl: 0;  Biotin 5000 MCG TABS, Take 1 tablet by mouth daily., Disp: ,  Rfl:   No Known Allergies  ROS:  Denies fevers, chills, URI symptoms, shortness of breath.  She denies any bleeding or bruising.  No other rashes or complaints.  PHYSICAL EXAM: BP 128/70  Pulse 60  Ht 4\' 9"  (1.448 m)  Wt 124 lb (56.246 kg)  BMI 26.83 kg/m2 Pleasant female in no distress Skin: Erythema under both breasts. Some mild satellite lesions.  No maceration or drainage, fairly dry.   ASSESSMENT/PLAN:  Candidal intertrigo  Need for prophylactic vaccination and inoculation against influenza - Plan: Flu vaccine HIGH DOSE PF (Fluzone Tri High dose)  Intertrigo, likely fungal given lack of improvement.  Only appears mild today, likely due to the treatment she has already tried, including the use of powders and trying to keep the area dry.  Add  an antifungal cream--ie. Lotrimin (clotrimazole) or lamisil cream, twice daily for up 2-3 weeks.  Continue to use powder (ie cornstarch, or medicated powder) to help absorb moisture and keep the area dry. You may use the fluocinonide cream if needed for severe itching, but this should be in addition to the antifungal cream. Try and keep the area under the breast as dry as possible--use blow dryer after bathing; change to clean/dry bra if bra is getting wet due to perspiration.  Cotton bras are better in absorbing moisture

## 2012-12-27 ENCOUNTER — Other Ambulatory Visit: Payer: Self-pay

## 2012-12-27 DIAGNOSIS — Z803 Family history of malignant neoplasm of breast: Secondary | ICD-10-CM

## 2012-12-27 DIAGNOSIS — Z1231 Encounter for screening mammogram for malignant neoplasm of breast: Secondary | ICD-10-CM

## 2012-12-29 ENCOUNTER — Other Ambulatory Visit: Payer: Self-pay

## 2012-12-29 DIAGNOSIS — I4891 Unspecified atrial fibrillation: Secondary | ICD-10-CM

## 2012-12-29 MED ORDER — APIXABAN 5 MG PO TABS: 5.0000 mg | ORAL_TABLET | Freq: Two times a day (BID) | ORAL | Status: DC

## 2012-12-30 ENCOUNTER — Encounter: Payer: Self-pay | Admitting: Family Medicine

## 2013-01-14 ENCOUNTER — Other Ambulatory Visit: Payer: Self-pay | Admitting: Family Medicine

## 2013-01-25 ENCOUNTER — Ambulatory Visit
Admission: RE | Admit: 2013-01-25 | Discharge: 2013-01-25 | Disposition: A | Discharge status: Home / Self Care | Payer: Medicare Other | Source: Ambulatory Visit

## 2013-01-25 DIAGNOSIS — Z803 Family history of malignant neoplasm of breast: Secondary | ICD-10-CM

## 2013-01-25 DIAGNOSIS — Z1231 Encounter for screening mammogram for malignant neoplasm of breast: Secondary | ICD-10-CM

## 2013-01-25 LAB — HM MAMMOGRAPHY: HM Mammogram: NORMAL

## 2013-02-03 ENCOUNTER — Other Ambulatory Visit: Payer: Self-pay | Admitting: Family Medicine

## 2013-02-03 ENCOUNTER — Other Ambulatory Visit: Payer: Self-pay | Admitting: Cardiology

## 2013-02-17 ENCOUNTER — Ambulatory Visit (INDEPENDENT_AMBULATORY_CARE_PROVIDER_SITE_OTHER): Payer: Medicare Other | Admitting: Cardiology

## 2013-02-17 ENCOUNTER — Encounter: Payer: Self-pay | Admitting: Cardiology

## 2013-02-17 VITALS — BP 130/74 | HR 57 | Ht <= 58 in | Wt 125.0 lb

## 2013-02-17 DIAGNOSIS — G4733 Obstructive sleep apnea (adult) (pediatric): Secondary | ICD-10-CM

## 2013-02-17 DIAGNOSIS — I4891 Unspecified atrial fibrillation: Secondary | ICD-10-CM | POA: Diagnosis not present

## 2013-02-17 NOTE — Patient Instructions (Signed)
Continue your current therapy  I will see you in 6 months.   

## 2013-02-17 NOTE — Progress Notes (Signed)
Abigail Day Date of Birth: 1933/06/24 Medical Record #[id number]  History of Present Illness: Abigail Day is seen for a followup visit. She has a history of atrial fib - on Eliquis. EF is normal per echo back in December of 2013. Other issues include HLD, hypothyroidism with prior ablation for hyperthyroidism, and diverticulosis. She is now on CPAP therapy for sleep apnea-followed by Dr. Shelle Iron. She occasionally notes dizzyness when she first puts her mask on and lies down at night. No chest pain or palpitations. Now living at [facility].   Current Outpatient Prescriptions on File Prior to Visit  Medication Sig Dispense Refill  . apixaban (ELIQUIS) 5 MG TABS tablet Take 1 tablet (5 mg total) by mouth 2 (two) times daily.  180 tablet  3  . Biotin 5000 MCG TABS Take 1 tablet by mouth daily.      . Calcium Carbonate-Vitamin D (CALCIUM 600 + D PO) Take 2 tablets by mouth daily.        . Cholecalciferol (VITAMIN D) 1000 UNITS capsule Take 1,000 Units by mouth daily.        Marland Kitchen co-enzyme Q-10 30 MG capsule Take 30 mg by mouth daily.        Marland Kitchen levothyroxine (SYNTHROID, LEVOTHROID) 75 MCG tablet TAKE ONE TABLET BY MOUTH DAILY.  90 tablet  0  . metoprolol tartrate (LOPRESSOR) 25 MG tablet TAKE ONE TABLET BY MOUTH TWICE DAILY  180 tablet  0  . Misc Natural Products (OSTEO BI-FLEX JOINT SHIELD PO) Take 2 tablets by mouth daily.      . Multiple Vitamin (MULTI-VITAMIN) tablet Take 1 tablet by mouth daily.        . Multiple Vitamins-Minerals (ICAPS PO) Take 1 tablet by mouth daily.       No current facility-administered medications on file prior to visit.    No Known Allergies  Past Medical History  Diagnosis Date  . Pure hypercholesterolemia   . Osteopenia   . Diverticulosis   . Urge urinary incontinence   . Hemorrhoids     internal and external  . Glaucoma suspect   . Unspecified hypothyroidism     s/p ablation for hyperthyroidism  . Hyperplastic colon polyp 09/25/10    Dr. Ewing Schlein  . Hemorrhoids  09/25/10    internal and external  . Palpitations 02/18/2012  . Dyspnea on exertion 02/18/2012  . Paroxysmal atrial fibrillation   . Sleep apnea     Past Surgical History  Procedure Laterality Date  . Abdominal hysterectomy  age 58    with BSO and bladder tack  . Eye surgery      bilateral cataract surgery  . Colonoscopy  11/06, 09/25/10    hyperplastic polyp 2012  . Laparoscopic nissen fundoplication  8/07    Dr. Daphine Deutscher  . Esophagogastroduodenoscopy  09/25/10    tortuous esophagus, intact Nissen fundoplication, dilated the spasm at esophageal GE junction    History  Smoking status  . Never Smoker   Smokeless tobacco  . Never Used    History  Alcohol Use  . Yes    Comment: wine, 1-2 glasses 1-2 times a month    Family History  Problem Relation Age of Onset  . Heart disease Mother   . Heart disease Father   . Diabetes Father   . Marfan syndrome Sister   . Heart disease Sister     valve replacement  . Heart disease Brother   . Diabetes Brother   . Heart disease Brother   .  Diabetes Brother   . Heart disease Brother   . Heart disease Brother   . Heart disease Brother     CABG  . Heart disease Brother     CABG  . Heart disease Sister     pacemaker  . Cancer Sister 10    breast cancer  . Aortic aneurysm Sister   . Cancer Sister 69    colon cancer  . Heart disease Sister     CABG    Review of Systems: The review of systems is per the HPI.  All other systems were reviewed and are negative.  Physical Exam: BP 130/74  Pulse 57  Ht 4\' 9"  (1.448 m)  Wt 125 lb (56.7 kg)  BMI 27.04 kg/m2 Patient is very pleasant and in no acute distress. Skin is warm and dry. Color is normal.  HEENT is unremarkable. Normocephalic/atraumatic. PERRL. Sclera are nonicteric. Neck is supple. No masses. No JVD. Lungs are clear. Cardiac exam shows a regular rate and rhythm. Abdomen is soft. Extremities are without edema. Gait and ROM are intact. No gross neurologic deficits  noted.  LABORATORY DATA: Pending for today  Lab Results  Component Value Date   WBC 5.6 07/23/2012   HGB 13.4 07/23/2012   HCT 39.2 07/23/2012   PLT 253.0 07/23/2012   GLUCOSE 119* 07/23/2012   CHOL 187 04/05/2012   TRIG 230* 04/05/2012   HDL 50 04/05/2012   LDLCALC 91 04/05/2012   ALT 20 04/05/2012   AST 21 04/05/2012   NA 139 07/23/2012   K 4.2 07/23/2012   CL 103 07/23/2012   CREATININE 0.8 07/23/2012   BUN 15 07/23/2012   CO2 27 07/23/2012   TSH 1.72 07/23/2012     Assessment / Plan:  1. Obstructive sleep apnea. Now on CPAP therapy  2. PAF - on beta blocker and Eliquis - she is in sinus by exam today. I'll followup again in 6 months. Scheduled for complete lab work with primary care in February.

## 2013-03-24 ENCOUNTER — Encounter: Payer: Self-pay | Admitting: Family Medicine

## 2013-03-24 ENCOUNTER — Ambulatory Visit (INDEPENDENT_AMBULATORY_CARE_PROVIDER_SITE_OTHER): Payer: Medicare Other | Admitting: Family Medicine

## 2013-03-24 VITALS — BP 118/76 | HR 60 | Ht <= 58 in | Wt 122.0 lb

## 2013-03-24 DIAGNOSIS — M899 Disorder of bone, unspecified: Secondary | ICD-10-CM

## 2013-03-24 DIAGNOSIS — E782 Mixed hyperlipidemia: Secondary | ICD-10-CM

## 2013-03-24 DIAGNOSIS — M949 Disorder of cartilage, unspecified: Secondary | ICD-10-CM | POA: Diagnosis not present

## 2013-03-24 DIAGNOSIS — R197 Diarrhea, unspecified: Secondary | ICD-10-CM

## 2013-03-24 DIAGNOSIS — Z01419 Encounter for gynecological examination (general) (routine) without abnormal findings: Secondary | ICD-10-CM | POA: Diagnosis not present

## 2013-03-24 DIAGNOSIS — G4733 Obstructive sleep apnea (adult) (pediatric): Secondary | ICD-10-CM | POA: Diagnosis not present

## 2013-03-24 DIAGNOSIS — N390 Urinary tract infection, site not specified: Secondary | ICD-10-CM | POA: Diagnosis not present

## 2013-03-24 DIAGNOSIS — M8580 Other specified disorders of bone density and structure, unspecified site: Secondary | ICD-10-CM

## 2013-03-24 DIAGNOSIS — Z79899 Other long term (current) drug therapy: Secondary | ICD-10-CM | POA: Diagnosis not present

## 2013-03-24 DIAGNOSIS — I4891 Unspecified atrial fibrillation: Secondary | ICD-10-CM | POA: Diagnosis not present

## 2013-03-24 DIAGNOSIS — Z0000 Encounter for general adult medical examination without abnormal findings: Secondary | ICD-10-CM

## 2013-03-24 DIAGNOSIS — E039 Hypothyroidism, unspecified: Secondary | ICD-10-CM | POA: Diagnosis not present

## 2013-03-24 LAB — CBC WITH DIFFERENTIAL/PLATELET
Basophils Absolute: 0 10*3/uL (ref 0.0–0.1)
Basophils Relative: 0 % (ref 0–1)
Eosinophils Absolute: 0.1 10*3/uL (ref 0.0–0.7)
Eosinophils Relative: 2 % (ref 0–5)
HCT: 44.4 % (ref 36.0–46.0)
HEMOGLOBIN: 15.1 g/dL — AB (ref 12.0–15.0)
LYMPHS ABS: 1 10*3/uL (ref 0.7–4.0)
Lymphocytes Relative: 22 % (ref 12–46)
MCH: 34.1 pg — ABNORMAL HIGH (ref 26.0–34.0)
MCHC: 34 g/dL (ref 30.0–36.0)
MCV: 100.2 fL — ABNORMAL HIGH (ref 78.0–100.0)
MONOS PCT: 11 % (ref 3–12)
Monocytes Absolute: 0.5 10*3/uL (ref 0.1–1.0)
NEUTROS ABS: 2.9 10*3/uL (ref 1.7–7.7)
Neutrophils Relative %: 65 % (ref 43–77)
Platelets: 243 10*3/uL (ref 150–400)
RBC: 4.43 MIL/uL (ref 3.87–5.11)
RDW: 13.1 % (ref 11.5–15.5)
WBC: 4.5 10*3/uL (ref 4.0–10.5)

## 2013-03-24 LAB — COMPREHENSIVE METABOLIC PANEL
ALBUMIN: 4.9 g/dL (ref 3.5–5.2)
ALK PHOS: 61 U/L (ref 39–117)
ALT: 21 U/L (ref 0–35)
AST: 25 U/L (ref 0–37)
BUN: 19 mg/dL (ref 6–23)
CALCIUM: 10.1 mg/dL (ref 8.4–10.5)
CHLORIDE: 102 meq/L (ref 96–112)
CO2: 29 mEq/L (ref 19–32)
Creat: 0.85 mg/dL (ref 0.50–1.10)
GLUCOSE: 100 mg/dL — AB (ref 70–99)
POTASSIUM: 4.5 meq/L (ref 3.5–5.3)
Sodium: 140 mEq/L (ref 135–145)
Total Bilirubin: 0.6 mg/dL (ref 0.3–1.2)
Total Protein: 7.3 g/dL (ref 6.0–8.3)

## 2013-03-24 LAB — LIPID PANEL
CHOL/HDL RATIO: 3.3 ratio
CHOLESTEROL: 204 mg/dL — AB (ref 0–200)
HDL: 62 mg/dL (ref >39)
LDL Cholesterol: 113 mg/dL — ABNORMAL HIGH (ref 0–99)
Triglycerides: 147 mg/dL (ref <150)
VLDL: 29 mg/dL (ref 0–40)

## 2013-03-24 LAB — TSH: TSH: 1.466 u[IU]/mL (ref 0.350–4.500)

## 2013-03-24 MED ORDER — PRAVASTATIN SODIUM 40 MG PO TABS: 80.0000 mg | ORAL_TABLET | Freq: Every day | ORAL | Status: DC

## 2013-03-24 NOTE — Patient Instructions (Addendum)
  HEALTH MAINTENANCE RECOMMENDATIONS:  It is recommended that you get at least 30 minutes of aerobic exercise at least 5 days/week (for weight loss, you may need as much as 60-90 minutes). This can be any activity that gets your heart rate up. This can be divided in 10-15 minute intervals if needed, but try and build up your endurance at least once a week.  Weight bearing exercise is also recommended twice weekly.  Eat a healthy diet with lots of vegetables, fruits and fiber.  "Colorful" foods have a lot of vitamins (ie green vegetables, tomatoes, red peppers, etc).  Limit sweet tea, regular sodas and alcoholic beverages, all of which has a lot of calories and sugar.  Up to 1 alcoholic drink daily may be beneficial for women (unless trying to lose weight, watch sugars).  Drink a lot of water.  Calcium recommendations are 1200-1500 mg daily (1500 mg for postmenopausal women or women without ovaries), and vitamin D 1000 IU daily.  This should be obtained from diet and/or supplements (vitamins), and calcium should not be taken all at once, but in divided doses.  Monthly self breast exams and yearly mammograms for women over the age of 540 is recommended.  Sunscreen of at least SPF 30 should be used on all sun-exposed parts of the skin when outside between the hours of 10 am and 4 pm (not just when at beach or pool, but even with exercise, golf, tennis, and yard work!)  Use a sunscreen that says "broad spectrum" so it covers both UVA and UVB rays, and make sure to reapply every 1-2 hours.  Remember to change the batteries in your smoke detectors when changing your clock times in the spring and fall.  Use your seat belt every time you are in a car, and please drive safely and not be distracted with cell phones and texting while driving.  Diarrhea--consider trial of lactose-free diet, or using Lactaid prior to a meal with dairy (ie cheese, creamy soups/sauces)  You probably have another urinary tract  infection.  Since your symptoms have been intermittent, and no fever or severe pain, you opted to wait until culture results were back to start the antibiotics.  Please call us sooner if you develop fevers, worsening kidney pain, vomiting, or other symptoms

## 2013-03-24 NOTE — Progress Notes (Signed)
Chief Complaint  Patient presents with  . Med check plus    fasting med check plus with pelvic exam. Has been having urinary burning and lp for the last week or two. UA showed 1+ blood and 2+ leuks.    Abigail Day is a 79 y.o. female who presents for a complete physical.  She has the following concerns:  Having loose stools after eating meal in dining room where she lives.  Not all the time, but frequently.  Less frequent problems when she eats at home, just with the large meal.  She has a lot of gas, and using a Gas-X and occasionally Tums, which does seem to help some. She isn't sure if it could be related to dairy, or other specific food.  BP's at home usually runs 130/70's, but only checks it occasionally (only takes it when she feels it is high).  No headaches or dizziness.  Feels slightly dizzy only when she first puts on CPAP mask and lies down.  She thinks this is also when her heart seems to act up.    Atrial fibrillation--under the care of Dr. [name].  No longer having the tachycardia, sometimes just at night, but not as fast as it used to be. She was diagnosed with OSA, and is currently on CPAP.  Sees Dr. Shelle Iron.   Hyperlipidemia follow-up: Patient is reportedly following a low-fat, low cholesterol diet. Compliant with medications and denies medication side effects. Denies any myalgias or joint pains.  Osteopenia--previously took [place]. She stopped it after reading about risks in the paper (on her own), and so just changed over to Vitamin D. Last DEXA 01/2012, and recommendation was to continue calcium, vitamin D, weight bearing exercise and repeat in 2 years.  AWV:  Other doctors caring for this patient include: Cardiology: Dr. [name] Dermatologist: Dr. Terri Piedra Ophtho: Dr. Dione Booze Dentist: Dr. Providence Lanius Pulmonary (for OSA):  Dr. Shelle Iron GI: Dr. Ewing Schlein  Depression screen:  Negative except for some decreased energy (which she relates to the afib)--see scanned sheet ADL screen: see FAST  questionnaire (scanned sheet); normal except for occasional ringing in her ears, and very slightly off balance when she starts walking.  Hasn't had any falls in 5-6 years.  End of Life Discussion:  She has a living will and healthcare power of attorney.    Immunization History  Administered Date(s) Administered  . Influenza Split 01/08/2011, 01/08/2012  . Influenza, High Dose Seasonal PF 12/08/2012  . Pneumococcal Conjugate-13 01/31/2002  . Pneumococcal Polysaccharide-23 01/08/2012  . Td 10/01/2004  . Tdap 01/08/2012  . Zoster 06/01/2008   pt had shingles vaccine  Shortly after CPE 05/2008 (through [facility]) Last Pap smear: n/a  Last mammogram: 01/2013 Last colonoscopy: 09/2010  Last DEXA: 01/2012  Ophtho: every 6 months  Dentist: every 9 months  Exercise: 3 days/week at the [facility], plus walks daily  Past Medical History  Diagnosis Date  . Pure hypercholesterolemia   . Osteopenia   . Diverticulosis   . Urge urinary incontinence   . Hemorrhoids     internal and external  . Glaucoma suspect   . Unspecified hypothyroidism     s/p ablation for hyperthyroidism  . Hyperplastic colon polyp 09/25/10    Dr. Ewing Schlein  . Hemorrhoids 09/25/10    internal and external  . Palpitations 02/18/2012  . Dyspnea on exertion 02/18/2012  . Paroxysmal atrial fibrillation   . Sleep apnea     Past Surgical History  Procedure Laterality Date  . Abdominal  hysterectomy  age 89    with BSO and bladder tack  . Eye surgery      bilateral cataract surgery  . Colonoscopy  11/06, 09/25/10    hyperplastic polyp 2012  . Laparoscopic nissen fundoplication  8/07    Dr. Daphine DeutscherMartin  . Esophagogastroduodenoscopy  09/25/10    tortuous esophagus, intact Nissen fundoplication, dilated the spasm at esophageal GE junction    History   Social History  . Marital Status: Married    Spouse Name: N/A    Number of Children: 2  . Years of Education: N/A   Occupational History  . retired    Social History  Main Topics  . Smoking status: Never Smoker   . Smokeless tobacco: Never Used  . Alcohol Use: Yes     Comment: once a month  . Drug Use: No  . Sexual Activity: Not Currently   Other Topics Concern  . Not on file   Social History Narrative   Lives with husband. 2 sons--one in [place], one in [place].  3 grandchildren. Moved to [facility] in 10/2012    Family History  Problem Relation Age of Onset  . Heart disease Mother   . Heart disease Father   . Diabetes Father   . Marfan syndrome Sister   . Heart disease Sister     valve replacement  . Heart disease Brother   . Diabetes Brother   . Heart disease Brother   . Diabetes Brother   . Heart disease Brother   . Heart disease Brother   . Heart disease Brother     CABG  . Heart disease Brother     CABG  . Heart disease Sister     pacemaker  . Cancer Sister [phone]    breast cancer  . Aortic aneurysm Sister   . Cancer Sister [phone]    colon cancer  . Heart disease Sister     CABG  . Dementia Sister     Current outpatient prescriptions:apixaban (ELIQUIS) 5 MG TABS tablet, Take 1 tablet (5 mg total) by mouth 2 (two) times daily., Disp: 180 tablet, Rfl: 3;  Biotin 5000 MCG TABS, Take 1 tablet by mouth daily., Disp: , Rfl: ;  Calcium Carbonate-Vitamin D (CALCIUM 600 + D PO), Take 2 tablets by mouth daily.  , Disp: , Rfl: ;  Cholecalciferol (VITAMIN D) 1000 UNITS capsule, Take 1,000 Units by mouth daily.  , Disp: , Rfl:  co-enzyme Q-10 30 MG capsule, Take 30 mg by mouth daily.  , Disp: , Rfl: ;  levothyroxine (SYNTHROID, LEVOTHROID) 75 MCG tablet, TAKE ONE TABLET BY MOUTH DAILY., Disp: 90 tablet, Rfl: 0;  metoprolol tartrate (LOPRESSOR) 25 MG tablet, TAKE ONE TABLET BY MOUTH TWICE DAILY, Disp: 180 tablet, Rfl: 0;  Misc Natural Products (OSTEO BI-FLEX JOINT SHIELD PO), Take 2 tablets by mouth daily., Disp: , Rfl:  Multiple Vitamin (MULTI-VITAMIN) tablet, Take 1 tablet by mouth daily.  , Disp: , Rfl: ;  Multiple Vitamins-Minerals  (ICAPS PO), Take 1 tablet by mouth daily., Disp: , Rfl: ;  pravastatin (PRAVACHOL) 40 MG tablet, Take 2 tablets (80 mg total) by mouth daily., Disp: 180 tablet, Rfl: 3  No Known Allergies  ROS: The patient denies anorexia, fever, weight changes, headaches, vision changes, decreased hearing, ear pain, sore throat, breast concerns, chest pain, syncope, dyspnea on exertion, cough, swelling, nausea, vomiting, constipation, abdominal pain, melena, hematochezia, indigestion/heartburn, hematuria, vaginal bleeding, discharge, odor or itch, genital lesions, numbness, tingling, weakness, tremor,  suspicious skin lesions, depression, anxiety, abnormal bleeding/bruising, or enlarged lymph nodes.  Wears CPAP at night, tolerating. Has some intermittent R flank pain, and some intermittent burning when she first voids.  She has intermittent frequent urination at night but she thinks this might be related to when her heart is beating faster, having symptoms from afib. Only rarely has urinary leakage. Intermittent rash under her breast, but improves by keeping it dry, and using antifungals prn. She has some intermittent burning when she first voids, and having some low back pain.   PHYSICAL EXAM: BP 118/76  Pulse 60  Ht 4\' 10"  (1.473 m)  Wt 122 lb (55.339 kg)  BMI 25.51 kg/m2  General Appearance:  Alert, cooperative, no distress, appears stated age   Head:  Normocephalic, without obvious abnormality, atraumatic   Eyes:  PERRL, conjunctiva/corneas clear, EOM's intact, fundi  benign   Ears:  Normal TM's and external ear canals   Nose:  Nares normal, mucosa normal, no drainage or sinus tenderness   Throat:  Lips, mucosa, and tongue normal; teeth and gums normal. Upper dentures and partials on lower.   Neck:  Supple, no lymphadenopathy; thyroid: no enlargement/tenderness/nodules; no carotid  bruit or JVD   Back:  Spine nontender; ROM normal. Mild R CVA tenderness. +kyphosis and scoliosis.   Lungs:  Clear to  auscultation bilaterally without wheezes, rales or ronchi; respirations unlabored   Chest Wall:  No tenderness or deformity   Heart:  Regular rate and rhythm, S1 and S2 normal, no murmur, rub  or gallop   Breast Exam:  No tenderness, masses, or nipple discharge or inversion. No axillary lymphadenopathy   Abdomen:  Soft, nondistended, normoactive bowel sounds,  no masses, no hepatosplenomegaly. Mild suprapubic tenderness.   Genitalia:  Normal external genitalia without lesions. BUS and vagina normal; No abnormal vaginal discharge. Uterus and adnexa surgically absent, no masses. Pap not performed   Rectal:  Normal tone, no masses or tenderness; guaiac negative stool   Extremities:  No clubbing, cyanosis or edema   Pulses:  2+ and symmetric all extremities   Skin:  Skin color, texture, turgor normal, no rashes or lesions.  some bruising on R great toenail   Lymph nodes:  Cervical, supraclavicular, and axillary nodes normal   Neurologic:  CNII-XII intact, normal strength, sensation and gait; reflexes 2+ and symmetric throughout.  Alert and oriented.  Informal mini-mental status exam performed--she had good initial recall, but upon recall 5 mins later, recalled 1/3, but got the other two with hints.  Upon recall a few minutes later, she was able to still recall all 3 items.         Psych: Normal mood, affect, hygiene and grooming.   ASSESSMENT/PLAN:  Medicare annual wellness visit, initial  Mixed hyperlipidemia - Plan: Lipid panel, Comprehensive metabolic panel, pravastatin (PRAVACHOL) 40 MG tablet  Unspecified hypothyroidism - euthyroid by history - Plan: TSH  Atrial fibrillation - rate controlled.  in regular rhythm currently  Obstructive sleep apnea - doing well on CPAP  Encounter for long-term (current) use of other medications - Plan: CBC with Differential  Urinary tract infection, site not specified - minimally symptomatic (intermittent)--prefers to wait on urine culture before  starting ABX. Will call if symptoms worsen prior to results (ie fever, flank pain) - Plan: Urine culture  Diarrhea - with large meals, in dining room at facility.  consider lactose intolerance--avoid dairy vs trial of Lactaid tablets.  avoid fried/greasy food  Routine gynecological examination  Osteopenia - continue  calcium, Vitamin D, weight-bearing exercise.  repeat DEXA 01/2014  Discussed monthly self breast exams and yearly mammograms; at least 30 minutes of aerobic activity at least 5 days/week; proper sunscreen use reviewed; healthy diet, including goals of calcium and vitamin D intake and alcohol recommendations (less than or equal to 1 drink/day) reviewed; regular seatbelt use; changing batteries in smoke detectors. Immunization recommendations discussed, UTD. Colonoscopy recommendations reviewed, UTD.   MOST form filled out, full care  Send copies of labs to Dr. [name]

## 2013-03-25 ENCOUNTER — Encounter: Payer: Self-pay | Admitting: Family Medicine

## 2013-03-26 LAB — URINE CULTURE: Colony Count: >=100000

## 2013-03-28 ENCOUNTER — Other Ambulatory Visit: Payer: Self-pay | Admitting: Family Medicine

## 2013-03-28 MED ORDER — SULFAMETHOXAZOLE-TMP DS 800-160 MG PO TABS: 1.0000 | ORAL_TABLET | Freq: Two times a day (BID) | ORAL | Status: DC

## 2013-03-30 DIAGNOSIS — H353190 Nonexudative age-related macular degeneration, unspecified eye, stage unspecified: Secondary | ICD-10-CM | POA: Diagnosis not present

## 2013-03-30 DIAGNOSIS — H1045 Other chronic allergic conjunctivitis: Secondary | ICD-10-CM | POA: Diagnosis not present

## 2013-03-30 DIAGNOSIS — H40019 Open angle with borderline findings, low risk, unspecified eye: Secondary | ICD-10-CM | POA: Diagnosis not present

## 2013-03-30 DIAGNOSIS — H02839 Dermatochalasis of unspecified eye, unspecified eyelid: Secondary | ICD-10-CM | POA: Diagnosis not present

## 2013-03-30 DIAGNOSIS — H04129 Dry eye syndrome of unspecified lacrimal gland: Secondary | ICD-10-CM | POA: Diagnosis not present

## 2013-03-30 DIAGNOSIS — Z961 Presence of intraocular lens: Secondary | ICD-10-CM | POA: Diagnosis not present

## 2013-03-31 ENCOUNTER — Telehealth: Payer: Self-pay

## 2013-03-31 MED ORDER — RIVAROXABAN 20 MG PO TABS
20.0000 mg | ORAL_TABLET | Freq: Every day | ORAL | Status: DC
Start: 2013-03-31 — End: 2013-04-25

## 2013-03-31 NOTE — Telephone Encounter (Signed)
Spoke with patient she stated price of Eliquis has increased to $ 300 a month.Stated she cannot afford.Spoke to Dr.Jordan he advised patient can change to Xarelto 20 mg daily.Samples of Xarelto 20 mg left at 3rd floor front desk.Patient advised if Xarelto cost too much let me know and I will see if she qualifies for any assistance.

## 2013-04-01 ENCOUNTER — Telehealth: Payer: Self-pay

## 2013-04-01 ENCOUNTER — Other Ambulatory Visit (INDEPENDENT_AMBULATORY_CARE_PROVIDER_SITE_OTHER): Payer: Medicare Other

## 2013-04-01 DIAGNOSIS — Z1211 Encounter for screening for malignant neoplasm of colon: Secondary | ICD-10-CM

## 2013-04-01 DIAGNOSIS — D649 Anemia, unspecified: Secondary | ICD-10-CM | POA: Diagnosis not present

## 2013-04-01 DIAGNOSIS — Z0000 Encounter for general adult medical examination without abnormal findings: Secondary | ICD-10-CM

## 2013-04-01 DIAGNOSIS — Z01419 Encounter for gynecological examination (general) (routine) without abnormal findings: Secondary | ICD-10-CM

## 2013-04-01 NOTE — Telephone Encounter (Signed)
Patient came to office this morning and picked up Xarelto 20 mg samples.Stated she will need assistance with Xarelto.Stated she cannot afford Xarelto.Message sent to Ashby DawesKim Adam RN for assistance.

## 2013-04-04 LAB — HEMOCCULT GUIAC POC 1CARD (OFFICE)
FECAL OCCULT BLD: NEGATIVE
FECAL OCCULT BLD: NEGATIVE
Fecal Occult Blood, POC: POSITIVE

## 2013-04-04 NOTE — Telephone Encounter (Signed)
Spoke to patient she stated she has a Nurse, children'sgovernmental policy called [employer] # [phone].ID # [id number],this $ 300 is after deductible.She was told by her insurance the manufacturer price increased.Patient appreciates your help.She is going out of town tomorrow call her on her cell #.

## 2013-04-04 NOTE — Telephone Encounter (Signed)
Who does she have her medicare part D/medication plan with? Does the $300.00 include deductible and that's why its so high?

## 2013-04-05 ENCOUNTER — Telehealth: Payer: Self-pay | Admitting: *Deleted

## 2013-04-05 NOTE — Telephone Encounter (Signed)
Insurer states the 300.00 is not related to deductible or prior authorization, patient needs to call [phone], [phone] regarding " CO-PAY STRUCTURE", WILL NOTIFY PATIENT

## 2013-04-06 ENCOUNTER — Telehealth: Payer: Self-pay | Admitting: Cardiology

## 2013-04-06 NOTE — Telephone Encounter (Signed)
New message    Talked to Abigail BattenKim about getting eliquis cheaper.  Want to give her an update.

## 2013-04-07 ENCOUNTER — Telehealth: Payer: Self-pay | Admitting: *Deleted

## 2013-04-07 NOTE — Telephone Encounter (Signed)
Patient states she will be getting the eliquis $30.00 for 90 days through [employer] for a "reseach" program.

## 2013-04-25 ENCOUNTER — Ambulatory Visit (INDEPENDENT_AMBULATORY_CARE_PROVIDER_SITE_OTHER): Payer: Medicare Other | Admitting: Pulmonary Disease

## 2013-04-25 ENCOUNTER — Encounter: Payer: Self-pay | Admitting: Pulmonary Disease

## 2013-04-25 ENCOUNTER — Telehealth: Payer: Self-pay | Admitting: Cardiology

## 2013-04-25 VITALS — BP 112/78 | HR 64 | Temp 97.7°F | Ht <= 58 in | Wt 126.0 lb

## 2013-04-25 DIAGNOSIS — G4733 Obstructive sleep apnea (adult) (pediatric): Secondary | ICD-10-CM | POA: Diagnosis not present

## 2013-04-25 NOTE — Telephone Encounter (Signed)
New message     Want nurse to know that she is getting her eliquis a lot cheaper with a drug card. She is taking eliquis not xarelto---want nurse to make sure her record is correct.

## 2013-04-25 NOTE — Assessment & Plan Note (Signed)
The pt overall is doing well with her cpap.  She is wearing compliantly, and has seen improvement in her sleep and daytime alertness.  She has some increased dryness issues, and I have asked her to increase the heat on her humidifier.  I have also encouraged her to work aggressively on weight reduction.

## 2013-04-25 NOTE — Patient Instructions (Signed)
Continue on cpap, and keep up with mask changes and supplies. Increase heat on humidifier to help with dryness and nasal congestion Work on weight loss followup with me again in one year if doing well.

## 2013-04-25 NOTE — Progress Notes (Signed)
   Subjective:    Patient ID: Abigail FearsMary B Day, female    DOB: August 12, 1933, 80 y.o.   MRN: [id number]  HPI Patient comes in today for followup of her obstructive sleep apnea. She is wearing CPAP compliantly at her optimal pressure, and currently is having no issues with her mask fit. She is having dryness issues, but has yet to increase the heat on her humidifier. She is using nasal pillows with a chin strap, and denies any issues with mouth opening. She feels that she is sleeping better with the device, and has improved daytime alertness.   Review of Systems  Constitutional: Negative for fever and unexpected weight change.  HENT: Positive for congestion and voice change. Negative for dental problem, ear pain, nosebleeds, postnasal drip, rhinorrhea, sinus pressure, sneezing, sore throat and trouble swallowing.   Eyes: Negative for redness and itching.  Respiratory: Positive for cough. Negative for chest tightness, shortness of breath and wheezing.   Cardiovascular: Negative for palpitations and leg swelling.  Gastrointestinal: Negative for nausea and vomiting.  Genitourinary: Negative for dysuria.  Musculoskeletal: Negative for joint swelling.  Skin: Negative for rash.  Neurological: Positive for dizziness ( at night with CPAP when intially turned on). Negative for headaches.  Hematological: Does not bruise/bleed easily.  Psychiatric/Behavioral: Negative for dysphoric mood. The patient is not nervous/anxious.        Objective:   Physical Exam Overweight female in no acute distress Nose without purulence or discharge noted No skin breakdown or pressure necrosis from the CPAP mask Neck without lymphadenopathy or thyromegaly Lower extremities without edema, no cyanosis Alert and oriented, moves all 4 extremities.        Assessment & Plan:

## 2013-04-26 ENCOUNTER — Telehealth: Payer: Self-pay | Admitting: Pulmonary Disease

## 2013-04-26 NOTE — Telephone Encounter (Signed)
Just saw pt yest and she said nothing about a pressure issue.  i discussed with her increasing the heat on her humidifier to get more moisture.

## 2013-04-26 NOTE — Telephone Encounter (Signed)
Pt calling in [facility] pressure Don't see you said anything in last OV note about this increase Please advise, thanks

## 2013-04-26 NOTE — Telephone Encounter (Signed)
Advised pt of KC rec's and she verbalized understanding and nothing further is needed She has turned this up on machine to 5

## 2013-04-27 NOTE — Telephone Encounter (Signed)
Returned call to patient 04/25/13 she stated she can get Eliquis at a affordable price with drug card that was given to her.Stated she is taking Eliquis 5 mg twice a day.

## 2013-05-05 ENCOUNTER — Other Ambulatory Visit: Payer: Self-pay | Admitting: Cardiology

## 2013-05-05 ENCOUNTER — Other Ambulatory Visit: Payer: Self-pay | Admitting: Family Medicine

## 2013-08-02 ENCOUNTER — Encounter: Payer: Self-pay | Admitting: Cardiology

## 2013-08-02 ENCOUNTER — Encounter (INDEPENDENT_AMBULATORY_CARE_PROVIDER_SITE_OTHER): Payer: Self-pay

## 2013-08-02 ENCOUNTER — Ambulatory Visit (INDEPENDENT_AMBULATORY_CARE_PROVIDER_SITE_OTHER): Payer: Medicare Other | Admitting: Cardiology

## 2013-08-02 VITALS — BP 128/78 | HR 60 | Ht <= 58 in | Wt 126.0 lb

## 2013-08-02 DIAGNOSIS — R079 Chest pain, unspecified: Secondary | ICD-10-CM

## 2013-08-02 DIAGNOSIS — E782 Mixed hyperlipidemia: Secondary | ICD-10-CM | POA: Diagnosis not present

## 2013-08-02 DIAGNOSIS — I4891 Unspecified atrial fibrillation: Secondary | ICD-10-CM

## 2013-08-02 DIAGNOSIS — R0609 Other forms of dyspnea: Secondary | ICD-10-CM

## 2013-08-02 DIAGNOSIS — R0989 Other specified symptoms and signs involving the circulatory and respiratory systems: Secondary | ICD-10-CM | POA: Diagnosis not present

## 2013-08-02 MED ORDER — METOPROLOL TARTRATE 25 MG PO TABS: 25.0000 mg | ORAL_TABLET | Freq: Two times a day (BID) | ORAL | Status: DC

## 2013-08-02 NOTE — Patient Instructions (Signed)
We will schedule you for a nuclear stress test.  I will plan on seeing you in 6 months.

## 2013-08-02 NOTE — Progress Notes (Signed)
Abigail Day Date of Birth: 1933/09/30 Medical Record #[id number]  History of Present Illness: [name] is seen for a followup visit. She has a history of atrial fib - on Eliquis. EF is normal per echo back in December of 2013. Other issues include HLD, hypothyroidism with prior ablation for hyperthyroidism, and diverticulosis. She is  on CPAP therapy for sleep apnea-followed by Dr. Shelle Ironlance. She complains of tiring easily and getting SOB with exertion. She also has pain in her right shoulder blade when she is more active. Has to take a nap in the afternoon. Minimal palpitations. No prior ischemic evaluation.   Current Outpatient Prescriptions on File Prior to Visit  Medication Sig Dispense Refill  . apixaban (ELIQUIS) 5 MG TABS tablet Take 1 tablet (5 mg total) by mouth 2 (two) times daily.  60 tablet  6  . Biotin 5000 MCG TABS Take 1 tablet by mouth daily.      . Calcium Carbonate-Vitamin D (CALCIUM 600 + D PO) Take 2 tablets by mouth daily.        . Cholecalciferol (VITAMIN D) 1000 UNITS capsule Take 1,000 Units by mouth daily.        Marland Kitchen. co-enzyme Q-10 30 MG capsule Take 30 mg by mouth daily.        Marland Kitchen. levothyroxine (SYNTHROID, LEVOTHROID) 75 MCG tablet TAKE ONE TABLET BY MOUTH ONCE DAILY  90 tablet  1  . Misc Natural Products (OSTEO BI-FLEX JOINT SHIELD PO) Take 2 tablets by mouth daily.      . Multiple Vitamin (MULTI-VITAMIN) tablet Take 1 tablet by mouth daily.        . Multiple Vitamins-Minerals (ICAPS PO) Take 1 tablet by mouth daily.      . pravastatin (PRAVACHOL) 40 MG tablet Take 2 tablets (80 mg total) by mouth daily.  180 tablet  3  . sulfamethoxazole-trimethoprim (BACTRIM DS) 800-160 MG per tablet Take 1 tablet by mouth 2 (two) times daily.  14 tablet  0   No current facility-administered medications on file prior to visit.    No Known Allergies  Past Medical History  Diagnosis Date  . Pure hypercholesterolemia   . Osteopenia   . Diverticulosis   . Urge urinary incontinence    . Hemorrhoids     internal and external  . Glaucoma suspect   . Unspecified hypothyroidism     s/p ablation for hyperthyroidism  . Hyperplastic colon polyp 09/25/10    Dr. Ewing SchleinMagod  . Hemorrhoids 09/25/10    internal and external  . Palpitations 02/18/2012  . Dyspnea on exertion 02/18/2012  . Paroxysmal atrial fibrillation   . Sleep apnea     Past Surgical History  Procedure Laterality Date  . Abdominal hysterectomy  age 80    with BSO and bladder tack  . Eye surgery      bilateral cataract surgery  . Colonoscopy  11/06, 09/25/10    hyperplastic polyp 2012  . Laparoscopic nissen fundoplication  8/07    Dr. Daphine DeutscherMartin  . Esophagogastroduodenoscopy  09/25/10    tortuous esophagus, intact Nissen fundoplication, dilated the spasm at esophageal GE junction    History  Smoking status  . Never Smoker   Smokeless tobacco  . Never Used    History  Alcohol Use  . Yes    Comment: once a month    Family History  Problem Relation Age of Onset  . Heart disease Mother   . Heart disease Father   . Diabetes Father   .  Marfan syndrome Sister   . Heart disease Sister     valve replacement  . Heart disease Brother   . Diabetes Brother   . Heart disease Brother   . Diabetes Brother   . Heart disease Brother   . Heart disease Brother   . Heart disease Brother     CABG  . Heart disease Brother     CABG  . Heart disease Sister     pacemaker  . Cancer Sister 63    breast cancer  . Aortic aneurysm Sister   . Cancer Sister 22    colon cancer  . Heart disease Sister     CABG  . Dementia Sister     Review of Systems: The review of systems is per the HPI.  All other systems were reviewed and are negative.  Physical Exam: BP 128/78  Pulse 60  Ht 4\' 10"  (1.473 m)  Wt 126 lb (57.153 kg)  BMI 26.34 kg/m2 Patient is very pleasant and in no acute distress. Skin is warm and dry. Color is normal.  HEENT is unremarkable. Normocephalic/atraumatic. PERRL. Sclera are nonicteric. Neck is  supple. No masses. No JVD. Lungs are clear. Cardiac exam shows a regular rate and rhythm. Abdomen is soft. Extremities are without edema. Gait and ROM are intact. No gross neurologic deficits noted.  LABORATORY DATA: Pending for today  Lab Results  Component Value Date   WBC 4.5 03/24/2013   HGB 15.1* 03/24/2013   HCT 44.4 03/24/2013   PLT 243 03/24/2013   GLUCOSE 100* 03/24/2013   CHOL 204* 03/24/2013   TRIG 147 03/24/2013   HDL 62 03/24/2013   LDLCALC 113* 03/24/2013   ALT 21 03/24/2013   AST 25 03/24/2013   NA 140 03/24/2013   K 4.5 03/24/2013   CL 102 03/24/2013   CREATININE 0.85 03/24/2013   BUN 19 03/24/2013   CO2 29 03/24/2013   TSH 1.466 03/24/2013     Assessment / Plan:  1. Obstructive sleep apnea. Now on CPAP therapy  2. PAF - on beta blocker and Eliquis - she is in sinus by exam today. I'll followup again in 6 months.   3. Dyspnea on exertion with pain in right shoulder blade. Concerned that this may be anginal equivalent. Recommend Lexiscan Myoview study.

## 2013-08-16 ENCOUNTER — Ambulatory Visit (HOSPITAL_COMMUNITY): Payer: Medicare Other | Attending: Cardiology | Admitting: Radiology

## 2013-08-16 VITALS — BP 128/75 | Ht <= 58 in | Wt 123.0 lb

## 2013-08-16 DIAGNOSIS — Z8249 Family history of ischemic heart disease and other diseases of the circulatory system: Secondary | ICD-10-CM | POA: Diagnosis not present

## 2013-08-16 DIAGNOSIS — E782 Mixed hyperlipidemia: Secondary | ICD-10-CM

## 2013-08-16 DIAGNOSIS — R002 Palpitations: Secondary | ICD-10-CM | POA: Insufficient documentation

## 2013-08-16 DIAGNOSIS — R0602 Shortness of breath: Secondary | ICD-10-CM | POA: Diagnosis not present

## 2013-08-16 DIAGNOSIS — R0989 Other specified symptoms and signs involving the circulatory and respiratory systems: Principal | ICD-10-CM | POA: Insufficient documentation

## 2013-08-16 DIAGNOSIS — R079 Chest pain, unspecified: Secondary | ICD-10-CM

## 2013-08-16 DIAGNOSIS — I4891 Unspecified atrial fibrillation: Secondary | ICD-10-CM

## 2013-08-16 DIAGNOSIS — R0609 Other forms of dyspnea: Secondary | ICD-10-CM | POA: Insufficient documentation

## 2013-08-16 MED ORDER — AMINOPHYLLINE 25 MG/ML IV SOLN
75.0000 mg | Freq: Two times a day (BID) | INTRAVENOUS | Status: DC | PRN
  Administered 2013-08-16: 75 mg via INTRAVENOUS

## 2013-08-16 MED ORDER — TECHNETIUM TC 99M SESTAMIBI GENERIC - CARDIOLITE
10.8000 | Freq: Once | INTRAVENOUS | Status: AC | PRN
  Administered 2013-08-16: 11 via INTRAVENOUS

## 2013-08-16 MED ORDER — TECHNETIUM TC 99M SESTAMIBI GENERIC - CARDIOLITE
33.0000 | Freq: Once | INTRAVENOUS | Status: AC | PRN
  Administered 2013-08-16: 33 via INTRAVENOUS

## 2013-08-16 MED ORDER — REGADENOSON 0.4 MG/5ML IV SOLN
0.4000 mg | Freq: Once | INTRAVENOUS | Status: AC
  Administered 2013-08-16: 0.4 mg via INTRAVENOUS

## 2013-08-16 NOTE — Progress Notes (Signed)
[facility] SITE 3 NUCLEAR MED [address] [place], [place] [address] [phone]    Cardiology Nuclear Med Study  Cathleen FearsMary B Day is a 80 y.o. female     MRN : [id number]     DOB: [birth year]  Procedure Date: 08/16/2013  Nuclear Med Background Indication for Stress Test:  Evaluation for Ischemia History:  No H/O CAD '13 ECHO: EF: 60%  Cardiac Risk Factors: Family History - CAD and Lipids  Symptoms:  DOE, Fatigue, Palpitations and SOB   Nuclear Pre-Procedure Caffeine/Decaff Intake:  None> 12 hrs NPO After: 6:00pm   Lungs:  clear O2 Sat: 95% on room air. IV 0.9% NS with Angio Cath:  22g  IV Site: R Antecubital x 1, tolerated well IV Started by:  Irean HongPatsy Edwards, RN  Chest Size (in):  34 Cup Size: D  Height: 4\' 10"  (1.473 m)  Weight:  123 lb (55.792 kg)  BMI:  Body mass index is 25.71 kg/(m^2). Tech Comments:  Patient held Lopressor this am. Irean HongPatsy Edwards, RN. Aminophylline 150 mg IV given for symptoms. All resolved before leaving.    Nuclear Med Study 1 or 2 Day study: 1 Day  Stress Test Type:  Eugenie BirksLexiscan  Reading MD: N/A  Order Authorizing Lorita Forinash:  Peter SwazilandJordan, MD  Resting Radionuclide: Technetium 1287m Sestamibi  Resting Radionuclide Dose: 11.0 mCi   Stress Radionuclide:  Technetium 7687m Sestamibi  Stress Radionuclide Dose: 33.0 mCi           Stress Protocol Rest HR: 53 Stress HR: 110  Rest BP: 128/75 Stress BP: 151/70  Exercise Time (min): n/a METS: n/a   Predicted Max HR: 142 bpm % Max HR: 77.46 bpm Rate Pressure Product: [id number]   Dose of Adenosine (mg):  n/a Dose of Lexiscan: 0.4 mg  Dose of Atropine (mg): n/a Dose of Dobutamine: n/a mcg/kg/min (at max HR)  Stress Test Technologist: Milana NaSabrina Williams, EMT-P  Nuclear Technologist:  Doyne Keelonya Yount, CNMT     Rest Procedure:  Myocardial perfusion imaging was performed at rest 45 minutes following the intravenous administration of Technetium 5087m Sestamibi. Rest ECG: Sinus bradycardia rate 52 with nonspecific ST-T  wave changes  Stress Procedure:  The patient received IV Lexiscan 0.4 mg over 15-seconds.  Technetium 5887m Sestamibi injected at 30-seconds. This patient had sob, felt funny, weak, and a headache with the Lexiscan injection. Quantitative spect images were obtained after a 45 minute delay. Stress ECG: No significant change from baseline ECG  QPS Raw Data Images:  Mild breast attenuation; normal left ventricular size. Stress Images:  Normal homogeneous uptake in all areas of the myocardium. Rest Images:  Normal homogeneous uptake in all areas of the myocardium. Subtraction (SDS):  No evidence of ischemia. Transient Ischemic Dilatation (Normal <1.22):  0.89 Lung/Heart Ratio (Normal <0.45):  0.25  Quantitative Gated Spect Images QGS EDV:  58 ml QGS ESV:  19 ml  Impression Exercise Capacity:  Lexiscan with no exercise. BP Response:  Normal blood pressure response. Clinical Symptoms:  Shortness of breath, leg weakness. Aminophyllin administered. ECG Impression:  No significant ST segment change suggestive of ischemia. Comparison with Prior Nuclear Study: No images to compare  Overall Impression:  Low risk stress nuclear study with no areas of ischemia identified.  LV Ejection Fraction: 68%.  LV Wall Motion:  NL LV Function; NL Wall Motion  Abigail Day, MARK, MD

## 2013-08-19 ENCOUNTER — Ambulatory Visit: Payer: Medicare Other | Admitting: Cardiology

## 2013-08-19 ENCOUNTER — Telehealth: Payer: Self-pay | Admitting: Cardiology

## 2013-08-19 NOTE — Telephone Encounter (Signed)
Spoke with pt and reviewed myoview results with her.

## 2013-08-19 NOTE — Telephone Encounter (Signed)
New message     Patient want stress test results

## 2013-10-26 ENCOUNTER — Other Ambulatory Visit: Payer: Self-pay | Admitting: Family Medicine

## 2013-10-26 DIAGNOSIS — E039 Hypothyroidism, unspecified: Secondary | ICD-10-CM

## 2013-10-26 NOTE — Telephone Encounter (Signed)
Is this okay to refill? 

## 2013-10-26 NOTE — Telephone Encounter (Signed)
done

## 2013-12-19 ENCOUNTER — Encounter: Payer: Self-pay | Admitting: Family Medicine

## 2013-12-19 ENCOUNTER — Ambulatory Visit (INDEPENDENT_AMBULATORY_CARE_PROVIDER_SITE_OTHER): Payer: Medicare Other | Admitting: Family Medicine

## 2013-12-19 VITALS — BP 138/82 | HR 72 | Ht <= 58 in | Wt 128.0 lb

## 2013-12-19 DIAGNOSIS — E039 Hypothyroidism, unspecified: Secondary | ICD-10-CM

## 2013-12-19 DIAGNOSIS — M549 Dorsalgia, unspecified: Secondary | ICD-10-CM

## 2013-12-19 DIAGNOSIS — Z23 Encounter for immunization: Secondary | ICD-10-CM | POA: Diagnosis not present

## 2013-12-19 DIAGNOSIS — R32 Unspecified urinary incontinence: Secondary | ICD-10-CM | POA: Diagnosis not present

## 2013-12-19 DIAGNOSIS — Z5181 Encounter for therapeutic drug level monitoring: Secondary | ICD-10-CM

## 2013-12-19 DIAGNOSIS — R5383 Other fatigue: Secondary | ICD-10-CM

## 2013-12-19 DIAGNOSIS — N3000 Acute cystitis without hematuria: Secondary | ICD-10-CM

## 2013-12-19 LAB — CBC WITH DIFFERENTIAL/PLATELET
BASOS PCT: 1 % (ref 0–1)
Basophils Absolute: 0 10*3/uL (ref 0.0–0.1)
Eosinophils Absolute: 0.1 10*3/uL (ref 0.0–0.7)
Eosinophils Relative: 2 % (ref 0–5)
HCT: 40 % (ref 36.0–46.0)
HEMOGLOBIN: 13.9 g/dL (ref 12.0–15.0)
Lymphocytes Relative: 31 % (ref 12–46)
Lymphs Abs: 1.3 10*3/uL (ref 0.7–4.0)
MCH: 34.1 pg — AB (ref 26.0–34.0)
MCHC: 34.8 g/dL (ref 30.0–36.0)
MCV: 98 fL (ref 78.0–100.0)
MONO ABS: 0.5 10*3/uL (ref 0.1–1.0)
MONOS PCT: 12 % (ref 3–12)
NEUTROS ABS: 2.3 10*3/uL (ref 1.7–7.7)
Neutrophils Relative %: 54 % (ref 43–77)
Platelets: 240 10*3/uL (ref 150–400)
RBC: 4.08 MIL/uL (ref 3.87–5.11)
RDW: 12.6 % (ref 11.5–15.5)
WBC: 4.2 10*3/uL (ref 4.0–10.5)

## 2013-12-19 LAB — POCT URINALYSIS DIPSTICK
Bilirubin, UA: NEGATIVE
Blood, UA: NEGATIVE
Glucose, UA: NEGATIVE
Ketones, UA: NEGATIVE
NITRITE UA: NEGATIVE
Spec Grav, UA: 1.01
UROBILINOGEN UA: NEGATIVE
pH, UA: 5

## 2013-12-19 LAB — COMPREHENSIVE METABOLIC PANEL
ALK PHOS: 57 U/L (ref 39–117)
ALT: 19 U/L (ref 0–35)
AST: 22 U/L (ref 0–37)
Albumin: 4.5 g/dL (ref 3.5–5.2)
BUN: 14 mg/dL (ref 6–23)
CALCIUM: 9.3 mg/dL (ref 8.4–10.5)
CO2: 27 mEq/L (ref 19–32)
CREATININE: 0.76 mg/dL (ref 0.50–1.10)
Chloride: 103 mEq/L (ref 96–112)
Glucose, Bld: 104 mg/dL — ABNORMAL HIGH (ref 70–99)
Potassium: 4.2 mEq/L (ref 3.5–5.3)
Sodium: 137 mEq/L (ref 135–145)
Total Bilirubin: 0.4 mg/dL (ref 0.2–1.2)
Total Protein: 6.6 g/dL (ref 6.0–8.3)

## 2013-12-19 LAB — TSH: TSH: 1.063 u[IU]/mL (ref 0.350–4.500)

## 2013-12-19 MED ORDER — CIPROFLOXACIN HCL 500 MG PO TABS
500.0000 mg | ORAL_TABLET | Freq: Two times a day (BID) | ORAL | Status: DC
Start: 2013-12-19 — End: 2014-01-20

## 2013-12-19 NOTE — Patient Instructions (Signed)
Take the cipro twice daily for a week. We will be in contact with you with your urine culture results and your lab results. Call us if you develop fever, chills, increasing back pain, blood in the urine, or other concerns.

## 2013-12-19 NOTE — Progress Notes (Signed)
Chief Complaint  Patient presents with  . kidney infection    possible kidney infection, comes and goes for about 4 months. back pain on right side   She presents with complaint of right sided back pain, which comes and goes over the last 6 months, and urinary incontinence.  She is having increased urge urinary incontinence for the last 2-3 months.  Symptoms have progressively gotten worse. She has an intermittent odor to her urine, no dysuria.  She is having urgency and frequency, sometimes up 5 times at night (which is a hassle with her CPAP machine). Frequency seems to be a little worse at night than during the day.  Has trouble holding her urine.  Denies any blood in urine.  Denies fevers, chills. She feels "so tired", also over the last 3-4 months.   Last UTI was in January, E.coli, treated with Septra.  She states that Dr. Martinique sent her for stress test when she started complaining of fatigue. Her fatigue persists.  Last thyroid check was in January. She denies missed doses. She is on blood thinners for atrial fibrillation.  She denies any bleeding, bruising.   She gets some heaviness in her legs, bilaterally.  Sometimes her legs go numb--when she first starts to walk, after she has been sitting.  Her legs feel a little weaker than they used to.  She continues to get regular exercise and ride the bicycle.  Past Medical History  Diagnosis Date  . Pure hypercholesterolemia   . Osteopenia   . Diverticulosis   . Urge urinary incontinence   . Hemorrhoids     internal and external  . Glaucoma suspect   . Unspecified hypothyroidism     s/p ablation for hyperthyroidism  . Hyperplastic colon polyp 09/25/10    Dr. Watt Climes  . Hemorrhoids 09/25/10    internal and external  . Palpitations 02/18/2012  . Dyspnea on exertion 02/18/2012  . Paroxysmal atrial fibrillation   . Sleep apnea    Past Surgical History  Procedure Laterality Date  . Abdominal hysterectomy  age 72    with BSO and bladder  tack  . Eye surgery      bilateral cataract surgery  . Colonoscopy  11/06, 09/25/10    hyperplastic polyp 2012  . Laparoscopic nissen fundoplication  3/24    Dr. Hassell Done  . Esophagogastroduodenoscopy  09/25/10    tortuous esophagus, intact Nissen fundoplication, dilated the spasm at esophageal GE junction   History   Social History  . Marital Status: Married    Spouse Name: N/A    Number of Children: 2  . Years of Education: N/A   Occupational History  . retired    Social History Main Topics  . Smoking status: Never Smoker   . Smokeless tobacco: Never Used  . Alcohol Use: Yes     Comment: once a month  . Drug Use: No  . Sexual Activity: Not Currently   Other Topics Concern  . Not on file   Social History Narrative   Lives with husband. 2 sons--one in [place], one in [place].  3 grandchildren. Moved to [place] in 10/2012    Outpatient Encounter Prescriptions as of 12/19/2013  Medication Sig  . apixaban (ELIQUIS) 5 MG TABS tablet Take 1 tablet (5 mg total) by mouth 2 (two) times daily.  . Biotin 5000 MCG TABS Take 1 tablet by mouth daily.  . Calcium Carbonate-Vitamin D (CALCIUM 600 + D PO) Take 2 tablets by mouth daily.    Marland Kitchen  Cholecalciferol (VITAMIN D) 1000 UNITS capsule Take 1,000 Units by mouth daily.    Marland Kitchen co-enzyme Q-10 30 MG capsule Take 30 mg by mouth daily.    Marland Kitchen CRANBERRY PO Take by mouth.  . levothyroxine (SYNTHROID, LEVOTHROID) 75 MCG tablet TAKE ONE TABLET BY MOUTH ONCE DAILY  . metoprolol tartrate (LOPRESSOR) 25 MG tablet Take 1 tablet (25 mg total) by mouth 2 (two) times daily.  . Misc Natural Products (OSTEO BI-FLEX JOINT SHIELD PO) Take 2 tablets by mouth daily.  . Multiple Vitamin (MULTI-VITAMIN) tablet Take 1 tablet by mouth daily.    . Multiple Vitamins-Minerals (ICAPS PO) Take 1 tablet by mouth daily.  . Omega-3 Fatty Acids (FISH OIL PO) Take by mouth.  . pravastatin (PRAVACHOL) 40 MG tablet Take 2 tablets (80 mg total) by mouth daily.  .  [DISCONTINUED] sulfamethoxazole-trimethoprim (BACTRIM DS) 800-160 MG per tablet Take 1 tablet by mouth 2 (two) times daily.   No Known Allergies  ROS:  No fevers, chills, nausea, vomiting, abdominal pain, bowel changes (has a movement whenever she eats).  No bleeding, bruising, rashes. Moods are good.  No URI symptoms, cough, shortness of breath or chest pain.  +fatigue. +back pain and urinary complaints.  See HPI.  PHYSICAL EXAM: BP 138/82  Pulse 72  Ht 4' 9.25" (1.454 m)  Wt 128 lb (58.06 kg)  BMI 27.46 kg/m2 Well develop, pleasant female in no distress HEENT:  PERRL, EOMI, conjunctiva clear Neck: no lymphadenopathy or thyromegaly Heart: regular rate and rhythm without murmur Lungs: clear bilaterally Abdomen: soft, no organomegaly or mass.  Mild tenderness suprapubically (feels like her bladder is very full again, just voided). Back: Mild tenderness at lower portion of CVA.  Scoliosis noted, and some superficial tenderness over the prominence in this area. Extremities: no edema Skin: no rashes or bruising  ASSESSMENT/PLAN:  Urinary incontinence, unspecified incontinence type - r/o UTI.  1+ leuks on u/a.  treat with ABX and await culture. - Plan: POCT urinalysis dipstick, Urine culture  Immunization due - Plan: Flu vaccine HIGH DOSE PF  Other fatigue - will check labs now (thyroid, eval for anemia given blood thinners for her afib, etc) - Plan: Comprehensive metabolic panel, CBC with Differential, TSH  Hypothyroidism, unspecified hypothyroidism type - Plan: TSH  Medication monitoring encounter - Plan: Comprehensive metabolic panel, CBC with Differential, TSH  Back pain, unspecified location - musculoskeletal vs related to kidney/infection.  Suspect mostly MSK (scoliosis)  Acute cystitis without hematuria - Plan: ciprofloxacin (CIPRO) 500 MG tablet   Right sided back pain--? Musculoskeletal vs related to UTI.  No symptoms of pyelo.  Treat presumptively for UTI given increasing  incontinence and frequency.  Send urine for culture.  If back pain persists, after no UTI (either treated, or none found), consider musculoskeletal component to back pain, and consider course of Physical Therapy (has scoliosis). Fatigue--c-met, cbc, TSH

## 2013-12-21 LAB — URINE CULTURE
COLONY COUNT: NO GROWTH
Organism ID, Bacteria: NO GROWTH

## 2013-12-22 ENCOUNTER — Other Ambulatory Visit: Payer: Self-pay

## 2013-12-22 DIAGNOSIS — Z1231 Encounter for screening mammogram for malignant neoplasm of breast: Secondary | ICD-10-CM

## 2014-01-04 ENCOUNTER — Other Ambulatory Visit: Payer: Self-pay | Admitting: *Deleted

## 2014-01-04 MED ORDER — APIXABAN 5 MG PO TABS: 5.0000 mg | ORAL_TABLET | Freq: Two times a day (BID) | ORAL | Status: DC

## 2014-01-10 ENCOUNTER — Other Ambulatory Visit: Payer: Self-pay

## 2014-01-10 MED ORDER — APIXABAN 5 MG PO TABS: 5.0000 mg | ORAL_TABLET | Freq: Two times a day (BID) | ORAL | Status: DC

## 2014-01-17 ENCOUNTER — Other Ambulatory Visit: Payer: Self-pay

## 2014-01-17 MED ORDER — APIXABAN 5 MG PO TABS: 5.0000 mg | ORAL_TABLET | Freq: Two times a day (BID) | ORAL | Status: DC

## 2014-01-20 ENCOUNTER — Ambulatory Visit (INDEPENDENT_AMBULATORY_CARE_PROVIDER_SITE_OTHER): Payer: Medicare Other | Admitting: Cardiology

## 2014-01-20 ENCOUNTER — Encounter: Payer: Self-pay | Admitting: Cardiology

## 2014-01-20 VITALS — BP 120/72 | HR 55 | Ht <= 58 in | Wt 130.2 lb

## 2014-01-20 DIAGNOSIS — I480 Paroxysmal atrial fibrillation: Secondary | ICD-10-CM

## 2014-01-20 DIAGNOSIS — R030 Elevated blood-pressure reading, without diagnosis of hypertension: Secondary | ICD-10-CM | POA: Diagnosis not present

## 2014-01-20 DIAGNOSIS — E782 Mixed hyperlipidemia: Secondary | ICD-10-CM

## 2014-01-20 DIAGNOSIS — IMO0001 Reserved for inherently not codable concepts without codable children: Secondary | ICD-10-CM

## 2014-01-20 MED ORDER — APIXABAN 5 MG PO TABS: 5.0000 mg | ORAL_TABLET | Freq: Two times a day (BID) | ORAL | Status: DC

## 2014-01-20 NOTE — Patient Instructions (Signed)
Continue your current therapy  I will see you in 6 months.   

## 2014-01-20 NOTE — Progress Notes (Signed)
disease Brother   . Diabetes Brother   . Heart disease Brother   . Heart disease Brother   . Heart disease Brother     CABG  . Heart disease Brother     CABG  . Heart disease Sister     pacemaker  . Cancer Sister [id number]    breast cancer  . Aortic aneurysm Sister   . Cancer Sister [id number]    colon cancer  . Heart disease Sister     CABG  . Dementia Sister     Review of Systems: The review of systems is per the HPI.  All other systems were reviewed and are negative.  Physical Exam: BP 120/72 mmHg  Pulse 55  Ht 4\' 8"  (1.422 m)  Wt 130 lb 3.2 oz (59.058 kg)  BMI 29.21 kg/m2 Patient is very pleasant and in no acute distress. Skin is warm and dry. Color is normal.  HEENT is unremarkable. Normocephalic/atraumatic. PERRL. Sclera are nonicteric. Neck is supple. No masses. No JVD. Lungs are clear. Cardiac exam shows a  regular rate and rhythm. No gallop or murmur. Abdomen is soft. Extremities are without edema. Gait and ROM are intact. No gross neurologic deficits noted.  LABORATORY DATA:   Ecg today: NSR rate 55 bpm. Normal. I have personally reviewed and interpreted this study.   Lab Results  Component Value Date   WBC 4.2 12/19/2013   HGB 13.9 12/19/2013   HCT 40.0 12/19/2013   PLT 240 12/19/2013   GLUCOSE 104* 12/19/2013   CHOL 204* 03/24/2013   TRIG 147 03/24/2013   HDL 62 03/24/2013   LDLCALC 113* 03/24/2013   ALT 19 12/19/2013   AST 22 12/19/2013   NA 137 12/19/2013   K 4.2 12/19/2013   CL 103 12/19/2013   CREATININE 0.76 12/19/2013   BUN 14 12/19/2013   CO2 27 12/19/2013   TSH 1.063 12/19/2013   Cardiology Nuclear Med Study  Abigail Day is a 80 y.o. female MRN : [id number] DOB: 08-Aug-1933  Procedure Date: 08/16/2013  Nuclear Med Background Indication for Stress Test: Evaluation for Ischemia History: No H/O CAD '13 ECHO: EF: 60%  Cardiac Risk Factors: Family History - CAD and Lipids  Symptoms: DOE, Fatigue, Palpitations and SOB   Nuclear Pre-Procedure Caffeine/Decaff Intake: None> 12 hrs NPO After: 6:00pm   Lungs: clear O2 Sat: 95% on room air. IV 0.9% NS with Angio Cath: 22g  IV Site: R Antecubital x 1, tolerated well IV Started by: Abigail HongPatsy Edwards, RN  Chest Size (in): 34 Cup Size: D  Height: 4\' 10"  (1.473 m)  Weight: 123 lb (55.792 kg)  BMI: Body mass index is 25.71 kg/(m^2). Tech Comments: Patient held Lopressor this am. Abigail HongPatsy Edwards, RN. Aminophylline 150 mg IV given for symptoms. All resolved before leaving.    Nuclear Med Study 1 or 2 day study: 1 day  Stress Test Type: Eugenie BirksLexiscan  Reading MD: N/A  Order Authorizing Provider: Nieve Rojero SwazilandJordan, MD  Resting Radionuclide: Technetium 5862m Sestamibi  Resting Radionuclide Dose: 11.0 mCi   Stress Radionuclide: Technetium 4462m Sestamibi  Stress Radionuclide Dose: 33.0 mCi      Stress  Protocol Rest HR: 53 Stress HR: 110  Rest BP: 128/75 Stress BP: 151/70  Exercise Time (min): n/a METS: n/a   Predicted Max HR: 142 bpm % Max HR: 77.46 bpm Rate Pressure Product: [id number]   Dose of Adenosine (mg): n/a Dose of Lexiscan: 0.4 mg  Dose of Atropine (mg): n/a Dose of Dobutamine: n/a mcg/kg/min (at  Abigail Day Date of Birth: [birth year] Medical Record #[id number]  History of Present Illness: Abigail Day is seen for follow up of Afib. She has a history of paroxysmal atrial fib - on Eliquis. EF is normal per echo back in December of 2013. Other issues include HLD, hypothyroidism with prior ablation for hyperthyroidism, and diverticulosis. She is  on CPAP therapy for sleep apnea-followed by Dr. Shelle Day. On follow up today she is feeling very well. She has infrequent palpitations at night sometimes associated with increased urination. No SOB or CP. No dizziness.    Current Outpatient Prescriptions on File Prior to Visit  Medication Sig Dispense Refill  . Biotin 5000 MCG TABS Take 1 tablet by mouth daily.    . Calcium Carbonate-Vitamin D (CALCIUM 600 + D PO) Take 2 tablets by mouth daily.      . Cholecalciferol (VITAMIN D) 1000 UNITS capsule Take 1,000 Units by mouth daily.      Marland Kitchen co-enzyme Q-10 30 MG capsule Take 30 mg by mouth daily.      Marland Kitchen CRANBERRY PO Take by mouth.    . levothyroxine (SYNTHROID, LEVOTHROID) 75 MCG tablet TAKE ONE TABLET BY MOUTH ONCE DAILY 90 tablet 1  . metoprolol tartrate (LOPRESSOR) 25 MG tablet Take 1 tablet (25 mg total) by mouth 2 (two) times daily. 180 tablet 3  . Misc Natural Products (OSTEO BI-FLEX JOINT SHIELD PO) Take 2 tablets by mouth daily.    . Multiple Vitamin (MULTI-VITAMIN) tablet Take 1 tablet by mouth daily.      . Multiple Vitamins-Minerals (ICAPS PO) Take 1 tablet by mouth daily.    . Omega-3 Fatty Acids (FISH OIL PO) Take by mouth.    . pravastatin (PRAVACHOL) 40 MG tablet Take 2 tablets (80 mg total) by mouth daily. 180 tablet 3   No current facility-administered medications on file prior to visit.    No Known Allergies  Past Medical History  Diagnosis Date  . Pure hypercholesterolemia   . Osteopenia   . Diverticulosis   . Urge urinary incontinence   . Hemorrhoids     internal and external  . Glaucoma suspect   . Unspecified  hypothyroidism     s/p ablation for hyperthyroidism  . Hyperplastic colon polyp 09/25/10    Abigail Day  . Hemorrhoids 09/25/10    internal and external  . Palpitations 02/18/2012  . Dyspnea on exertion 02/18/2012  . Paroxysmal atrial fibrillation   . Sleep apnea     Past Surgical History  Procedure Laterality Date  . Abdominal hysterectomy  age 81    with BSO and bladder tack  . Eye surgery      bilateral cataract surgery  . Colonoscopy  11/06, 09/25/10    hyperplastic polyp 2012  . Laparoscopic nissen fundoplication  8/07    Abigail Day  . Esophagogastroduodenoscopy  09/25/10    tortuous esophagus, intact Nissen fundoplication, dilated the spasm at esophageal GE junction    History  Smoking status  . Never Smoker   Smokeless tobacco  . Never Used    History  Alcohol Use  . Yes    Comment: once a month    Family History  Problem Relation Age of Onset  . Heart disease Mother   . Heart disease Father   . Diabetes Father   . Marfan syndrome Sister   . Heart disease Sister     valve replacement  . Heart disease Brother   . Diabetes Brother   . Heart  disease Brother   . Diabetes Brother   . Heart disease Brother   . Heart disease Brother   . Heart disease Brother     CABG  . Heart disease Brother     CABG  . Heart disease Sister     pacemaker  . Cancer Sister 1670    breast cancer  . Aortic aneurysm Sister   . Cancer Sister [id number]    colon cancer  . Heart disease Sister     CABG  . Dementia Sister     Review of Systems: The review of systems is per the HPI.  All other systems were reviewed and are negative.  Physical Exam: BP 120/72 mmHg  Pulse 55  Ht 4\' 8"  (1.422 m)  Wt 130 lb 3.2 oz (59.058 kg)  BMI 29.21 kg/m2 Patient is very pleasant and in no acute distress. Skin is warm and dry. Color is normal.  HEENT is unremarkable. Normocephalic/atraumatic. PERRL. Sclera are nonicteric. Neck is supple. No masses. No JVD. Lungs are clear. Cardiac exam shows a  regular rate and rhythm. No gallop or murmur. Abdomen is soft. Extremities are without edema. Gait and ROM are intact. No gross neurologic deficits noted.  LABORATORY DATA:   Ecg today: NSR rate 55 bpm. Normal. I have personally reviewed and interpreted this study.   Lab Results  Component Value Date   WBC 4.2 12/19/2013   HGB 13.9 12/19/2013   HCT 40.0 12/19/2013   PLT 240 12/19/2013   GLUCOSE 104* 12/19/2013   CHOL 204* 03/24/2013   TRIG 147 03/24/2013   HDL 62 03/24/2013   LDLCALC 113* 03/24/2013   ALT 19 12/19/2013   AST 22 12/19/2013   NA 137 12/19/2013   K 4.2 12/19/2013   CL 103 12/19/2013   CREATININE 0.76 12/19/2013   BUN 14 12/19/2013   CO2 27 12/19/2013   TSH 1.063 12/19/2013   Cardiology Nuclear Med Study  Abigail Day is a 80 y.o. female MRN : [id number] DOB: 08-Aug-1933  Procedure Date: 08/16/2013  Nuclear Med Background Indication for Stress Test: Evaluation for Ischemia History: No H/O CAD '13 ECHO: EF: 60%  Cardiac Risk Factors: Family History - CAD and Lipids  Symptoms: DOE, Fatigue, Palpitations and SOB   Nuclear Pre-Procedure Caffeine/Decaff Intake: None> 12 hrs NPO After: 6:00pm   Lungs: clear O2 Sat: 95% on room air. IV 0.9% NS with Angio Cath: 22g  IV Site: R Antecubital x 1, tolerated well IV Started by: Abigail HongPatsy Edwards, RN  Chest Size (in): 34 Cup Size: D  Height: 4\' 10"  (1.473 m)  Weight: 123 lb (55.792 kg)  BMI: Body mass index is 25.71 kg/(m^2). Tech Comments: Patient held Lopressor this am. Abigail HongPatsy Edwards, RN. Aminophylline 150 mg IV given for symptoms. All resolved before leaving.    Nuclear Med Study 1 or 2 day study: 1 day  Stress Test Type: Eugenie BirksLexiscan  Reading MD: N/A  Order Authorizing Provider: Nieve Rojero [place], MD  Resting Radionuclide: Technetium 5862m Sestamibi  Resting Radionuclide Dose: 11.0 mCi   Stress Radionuclide: Technetium 4462m Sestamibi  Stress Radionuclide Dose: 33.0 mCi      Stress  Protocol Rest HR: 53 Stress HR: 110  Rest BP: 128/75 Stress BP: 151/70  Exercise Time (min): n/a METS: n/a   Predicted Max HR: 142 bpm % Max HR: 77.46 bpm Rate Pressure Product: [id number]   Dose of Adenosine (mg): n/a Dose of Lexiscan: 0.4 mg  Dose of Atropine (mg): n/a Dose of Dobutamine: n/a mcg/kg/min (at

## 2014-02-02 ENCOUNTER — Ambulatory Visit
Admission: RE | Admit: 2014-02-02 | Discharge: 2014-02-02 | Disposition: A | Discharge status: Home / Self Care | Payer: Medicare Other | Source: Ambulatory Visit

## 2014-02-02 DIAGNOSIS — Z1231 Encounter for screening mammogram for malignant neoplasm of breast: Secondary | ICD-10-CM

## 2014-02-03 ENCOUNTER — Other Ambulatory Visit: Payer: Self-pay | Admitting: Family Medicine

## 2014-02-03 DIAGNOSIS — R928 Other abnormal and inconclusive findings on diagnostic imaging of breast: Secondary | ICD-10-CM

## 2014-02-20 ENCOUNTER — Ambulatory Visit
Admission: RE | Admit: 2014-02-20 | Discharge: 2014-02-20 | Disposition: A | Discharge status: Home / Self Care | Payer: Medicare Other | Source: Ambulatory Visit | Attending: Family Medicine | Admitting: Family Medicine

## 2014-02-20 DIAGNOSIS — N6489 Other specified disorders of breast: Secondary | ICD-10-CM | POA: Diagnosis not present

## 2014-02-20 DIAGNOSIS — R928 Other abnormal and inconclusive findings on diagnostic imaging of breast: Secondary | ICD-10-CM

## 2014-03-27 ENCOUNTER — Encounter: Payer: Self-pay | Admitting: Family Medicine

## 2014-03-27 ENCOUNTER — Ambulatory Visit (INDEPENDENT_AMBULATORY_CARE_PROVIDER_SITE_OTHER): Payer: Medicare Other | Admitting: Family Medicine

## 2014-03-27 VITALS — BP 138/80 | HR 60 | Ht <= 58 in | Wt 129.0 lb

## 2014-03-27 DIAGNOSIS — G4733 Obstructive sleep apnea (adult) (pediatric): Secondary | ICD-10-CM | POA: Diagnosis not present

## 2014-03-27 DIAGNOSIS — Z0000 Encounter for general adult medical examination without abnormal findings: Secondary | ICD-10-CM

## 2014-03-27 DIAGNOSIS — E039 Hypothyroidism, unspecified: Secondary | ICD-10-CM | POA: Diagnosis not present

## 2014-03-27 DIAGNOSIS — Z131 Encounter for screening for diabetes mellitus: Secondary | ICD-10-CM

## 2014-03-27 DIAGNOSIS — E782 Mixed hyperlipidemia: Secondary | ICD-10-CM

## 2014-03-27 DIAGNOSIS — Z01419 Encounter for gynecological examination (general) (routine) without abnormal findings: Secondary | ICD-10-CM | POA: Diagnosis not present

## 2014-03-27 DIAGNOSIS — Z23 Encounter for immunization: Secondary | ICD-10-CM | POA: Diagnosis not present

## 2014-03-27 DIAGNOSIS — M8580 Other specified disorders of bone density and structure, unspecified site: Secondary | ICD-10-CM

## 2014-03-27 DIAGNOSIS — I480 Paroxysmal atrial fibrillation: Secondary | ICD-10-CM

## 2014-03-27 DIAGNOSIS — E739 Lactose intolerance, unspecified: Secondary | ICD-10-CM | POA: Diagnosis not present

## 2014-03-27 LAB — GLUCOSE, RANDOM: GLUCOSE: 104 mg/dL — AB (ref 70–99)

## 2014-03-27 LAB — LIPID PANEL
Cholesterol: 193 mg/dL (ref 0–200)
HDL: 47 mg/dL (ref >39)
LDL Cholesterol: 80 mg/dL (ref 0–99)
Total CHOL/HDL Ratio: 4.1 Ratio
Triglycerides: 332 mg/dL — ABNORMAL HIGH (ref <150)
VLDL: 66 mg/dL — AB (ref 0–40)

## 2014-03-27 MED ORDER — PRAVASTATIN SODIUM 40 MG PO TABS: 80.0000 mg | ORAL_TABLET | Freq: Every day | ORAL | Status: DC

## 2014-03-27 NOTE — Patient Instructions (Addendum)
  HEALTH MAINTENANCE RECOMMENDATIONS:  It is recommended that you get at least 30 minutes of aerobic exercise at least 5 days/week (for weight loss, you may need as much as 60-90 minutes). This can be any activity that gets your heart rate up. This can be divided in 10-15 minute intervals if needed, but try and build up your endurance at least once a week.  Weight bearing exercise is also recommended twice weekly.  Eat a healthy diet with lots of vegetables, fruits and fiber.  "Colorful" foods have a lot of vitamins (ie green vegetables, tomatoes, red peppers, etc).  Limit sweet tea, regular sodas and alcoholic beverages, all of which has a lot of calories and sugar.  Up to 1 alcoholic drink daily may be beneficial for women (unless trying to lose weight, watch sugars).  Drink a lot of water.  Calcium recommendations are 1200-1500 mg daily (1500 mg for postmenopausal women or women without ovaries), and vitamin D 1000 IU daily.  This should be obtained from diet and/or supplements (vitamins), and calcium should not be taken all at once, but in divided doses.  Monthly self breast exams and yearly mammograms for women over the age of 79 is recommended.  Sunscreen of at least SPF 30 should be used on all sun-exposed parts of the skin when outside between the hours of 10 am and 4 pm (not just when at beach or pool, but even with exercise, golf, tennis, and yard work!)  Use a sunscreen that says "broad spectrum" so it covers both UVA and UVB rays, and make sure to reapply every 1-2 hours.  Remember to change the batteries in your smoke detectors when changing your clock times in the spring and fall.  Use your seat belt every time you are in a car, and please drive safely and not be distracted with cell phones and texting while driving.  You are due for bone density test.  The order was put in the computer, and [facility] should [facility] you.  If you don't hear from them, please call them to  schedule.  Your feet look good, circulation is normal. I recommend a trial of arch supports, as well as making sure that the toe area of the shoe is tall enough so that it doesn't press on your tops of your toes.  If you have ongoing foot problems, I recommend seeing [facility].  Let me know if you need a referral.

## 2014-03-27 NOTE — Progress Notes (Signed)
Chief Complaint  Patient presents with  . Hypertension    fasting med check plus with pelvic exam. She states that her bones have been achy lately, wonders if her pravastatin is causing this. She would like you to look at her feet-her toes have blisters on them.    Abigail Day is a 81 y.o. female who presents for annual wellness visit and follow-up on chronic medical conditions.  She has the following concerns:  It took 18 months to sell her house--finally sold, so stress (including financial) is much less. Feels relieved.  She was seen in October with urinary complaints. The culture was negative.  She currently denies any urinary symptoms (she continues to drink cranberry juice).  Hypothyroidism:  Denies changes in energy, skin, nails, moods. Bowels--see below. She has gained 7 pounds since her last physical. She thinks this is related to eating large meal at the buffet for dinner. She is trying to control this, and limit her portion sizes. She has only gained 1 additional pound since her last visit in October. She feels like her hair doesn't have any "body" to it, but doesn't feel like she is losing any.  Has had some thinning over the years, but no significant change. TSH done in October was normal.  Hyperlipidemia follow-up: Patient is reportedly following a low-fat, low cholesterol diet. She has cut back on her dairy, and avoids dessert (sometimes shares small bit with her husband). Compliant with medications and denies medication side effects. Denies any myalgias or joint pains.   She feels stiff in the morning when she first gets up, and sometimes aches at night (which was worse over the last week when she was sick with a cold).  Legs get cold and ache, relieved by tylenol as needed, relieved by warming them up.  Seems to be less when she has gone to the gym and used the exercise bike that day.  She is complaining of pain in the bottom of her feet, mostly numbness (not really pain) at the base  of her feet, some tingling. She also has some blisters on the top of her foot, from rubbing on the top of her shoes. She has never seen a podiatrist.  Osteopenia--previously took [place]. She stopped it after reading about risks in the paper (on her own), and so just changed over to Vitamin D. Last DEXA 01/2012, and recommendation was to continue calcium, vitamin D, weight bearing exercise and repeat in 2 years. She has not yet scheduled this.  Atrial fibrillation--followed by Dr. [name], on Eliquis.  Denies bleeding, bruising, palpitations (infrequent, at night--had one the other night for the first time in a long time, which she relates to not being able to use CPAP due to a cold), chest pain, shortness of breath. Last visit with Dr. [name] was 01/2014, nuclear stress test 08/2013 with normal EF, no ischemia.  OSA-- compliant with CPAP. Followed by Dr. Shelle Iron. Was only able to use it a few hours/night with her recent cold, but that has resolved.  She feels more refreshed since being on CPAP.  Diarrhea related to large meals in dining room. Noted last year.  She switched to Lactaid milk and Lactaid tablets, which seems to be helping some (1 prior to breakfast/lunch, 2 prior to dinner).  Doesn't have to go as quickly, urgency is less, tolerable.  She only recently switched to taking the Lactaid tablet WITH the meal, rather than taking it prior to heading to dining room, and that has  helped.   Immunization History  Administered Date(s) Administered  . Influenza Split 01/08/2011, 01/08/2012  . Influenza, High Dose Seasonal PF 12/08/2012, 12/19/2013  . Pneumococcal Conjugate-13 01/31/2002  . Pneumococcal Polysaccharide-23 01/08/2012  . Td 10/01/2004  . Tdap 01/08/2012  . Zoster 06/01/2008   Last Pap smear: n/a  Last mammogram: 01/2014 (with diagnostic on L 03/09/14) Last colonoscopy: 09/2010  Last DEXA: 01/2012  Ophtho: yearly Dentist: every 9 months  Exercise: 3 days/week at the [facility], plus  walks daily (weather-dependent)  Other doctors caring for patient include: Cardiology: Dr. [name] Dermatologist: Dr. Terri Piedra Ophtho: Dr. Dione Booze Dentist: Dr. Providence Lanius Pulmonary (for OSA): Dr. Shelle Iron GI: Dr. Ewing Schlein  Depression screen:  See scanned questionnaire.  Notable for decrease in energy, which seems to be intermittent, and some trouble with sleep (sometimes related to waking up with her legs bothering her).  Sleeps better since use CPAP ADL screen:  See scanned questionnaire.  Notable only for occasional ringing in her ears (relieved by taking allergy meds); no falls in the last year.  End of Life Discussion:  Patient has a living will and medical power of attorney  Past Medical History  Diagnosis Date  . Pure hypercholesterolemia   . Osteopenia   . Diverticulosis   . Urge urinary incontinence   . Hemorrhoids     internal and external  . Glaucoma suspect   . Unspecified hypothyroidism     s/p ablation for hyperthyroidism  . Hyperplastic colon polyp 09/25/10    Dr. Ewing Schlein  . Hemorrhoids 09/25/10    internal and external  . Palpitations 02/18/2012  . Dyspnea on exertion 02/18/2012  . Paroxysmal atrial fibrillation   . Sleep apnea     Past Surgical History  Procedure Laterality Date  . Abdominal hysterectomy  age 13    with BSO and bladder tack  . Eye surgery      bilateral cataract surgery  . Colonoscopy  11/06, 09/25/10    hyperplastic polyp 2012  . Laparoscopic nissen fundoplication  8/07    Dr. Daphine Deutscher  . Esophagogastroduodenoscopy  09/25/10    tortuous esophagus, intact Nissen fundoplication, dilated the spasm at esophageal GE junction    History   Social History  . Marital Status: Married    Spouse Name: N/A    Number of Children: 2  . Years of Education: N/A   Occupational History  . retired    Social History Main Topics  . Smoking status: Never Smoker   . Smokeless tobacco: Never Used  . Alcohol Use: Yes     Comment: once a month  . Drug Use: No  .  Sexual Activity: Not Currently   Other Topics Concern  . Not on file   Social History Narrative   Lives with husband. 2 sons--one in [place], one in [place].  3 grandchildren. Moved to [facility] in 10/2012    Family History  Problem Relation Age of Onset  . Heart disease Mother   . Heart disease Father   . Diabetes Father   . Marfan syndrome Sister   . Heart disease Sister     valve replacement  . Heart disease Brother   . Diabetes Brother   . Heart disease Brother   . Diabetes Brother   . Heart disease Brother   . Heart disease Brother   . Heart disease Brother     CABG  . Heart disease Brother     CABG  . Heart disease Sister  pacemaker  . Cancer Sister [id number]    breast cancer  . Aortic aneurysm Sister   . Cancer Sister [id number]    colon cancer  . Heart disease Sister     CABG  . Heart disease Brother   . Heart disease Sister   . Hyperlipidemia Sister   . Stroke Sister     Outpatient Encounter Prescriptions as of 03/27/2014  Medication Sig Note  . apixaban (ELIQUIS) 5 MG TABS tablet Take 1 tablet (5 mg total) by mouth 2 (two) times daily.   . Biotin 5000 MCG TABS Take 1 tablet by mouth daily.   . Calcium Carbonate-Vitamin D (CALCIUM 600 + D PO) Take 2 tablets by mouth daily.     . Cholecalciferol (VITAMIN D) 1000 UNITS capsule Take 1,000 Units by mouth daily.     Marland Kitchen. co-enzyme Q-10 30 MG capsule Take 30 mg by mouth daily.     Marland Kitchen. CRANBERRY PO Take by mouth.   . lactase (LACTAID) 3000 UNITS tablet Take 3,000 Units by mouth 3 (three) times daily with meals. 03/27/2014: Takes 1 prior to breakfast and lunch, 2 prior to dinner  . levothyroxine (SYNTHROID, LEVOTHROID) 75 MCG tablet TAKE ONE TABLET BY MOUTH ONCE DAILY   . metoprolol tartrate (LOPRESSOR) 25 MG tablet Take 1 tablet (25 mg total) by mouth 2 (two) times daily.   . Misc Natural Products (OSTEO BI-FLEX JOINT SHIELD PO) Take 2 tablets by mouth daily.   . Multiple Vitamin (MULTI-VITAMIN) tablet Take 1 tablet by  mouth daily.     . Multiple Vitamins-Minerals (ICAPS PO) Take 1 tablet by mouth daily.   . Omega-3 Fatty Acids (FISH OIL PO) Take by mouth. 03/27/2014: Takes 1 daily  . pravastatin (PRAVACHOL) 40 MG tablet Take 2 tablets (80 mg total) by mouth daily.     No Known Allergies  ROS: The patient denies anorexia, fever, headaches, vision changes, decreased hearing, ear pain, sore throat, breast concerns, chest pain, syncope, dyspnea on exertion (has some with stairs, more since she has gained weight, otherwise unchanged/stable); denies cough, swelling, nausea, vomiting, constipation, abdominal pain, melena, hematochezia, indigestion/heartburn, dysuria, hematuria, vaginal bleeding, discharge, odor or itch, genital lesions, numbness (just bottom of feet), tingling, weakness, tremor, suspicious skin lesions, depression, anxiety, abnormal bleeding/bruising, or enlarged lymph nodes.  Some problems remembering names (too many people around her where she currently lives), otherwise no memory concerns. Wears CPAP at night, tolerating. Occasional pain under her right shoulder blade, unchanged.Only rarely has urinary leakage--just when she gets up in the middle of night). Intermittent rash under her breast, comes and goes (improves by keeping it dry, and using antifungals prn.) She gained 7 pounds since her physical a year ago, but only 1 pound in the last 3 months Getting over a cold.  PHYSICAL EXAM:  BP 138/80 mmHg  Pulse 60  Ht 4\' 10"  (1.473 m)  Wt 129 lb (58.514 kg)  BMI 26.97 kg/m2  General Appearance:  Alert, cooperative, no distress, appears stated age   Head:  Normocephalic, without obvious abnormality, atraumatic   Eyes:  PERRL, conjunctiva/corneas clear, EOM's intact, fundi  benign   Ears:  Normal TM's and external ear canals   Nose:  Nares normal, mucosa normal, no drainage or sinus tenderness.  Some crusting in both nares, no erythema or purulence  Throat:  Lips, mucosa, and  tongue normal; teeth and gums normal. Upper dentures and partials on lower.   Neck:  Supple, no lymphadenopathy; thyroid: no enlargement/tenderness/nodules; no carotid  bruit or JVD   Back:  Spine nontender; ROM normal. No CVA tenderness. +kyphosis and scoliosis.   Lungs:  Clear to auscultation bilaterally without wheezes, rales or ronchi; respirations unlabored   Chest Wall:  No tenderness or deformity   Heart:  Regular rate and rhythm, S1 and S2 normal, no murmur, rub  or gallop   Breast Exam:  No tenderness, masses, or nipple discharge or inversion. No axillary lymphadenopathy   Abdomen:  Soft, nondistended, normoactive bowel sounds, nontender; no masses, no hepatosplenomegaly.  Genitalia:  Normal external genitalia without lesions. BUS and vagina normal, atrophic changes noted; No abnormal vaginal discharge. Uterus and adnexa surgically absent, no masses. Pap not performed   Rectal:  Normal tone, no masses or tenderness; guaiac negative stool   Extremities:  No clubbing, cyanosis or edema. No lesions or blisters noted.  2+ pulses.  Area of numbness is at the metatarsal heads on both feet.  There is no swelling, nontender.  Pulses:  2+ and symmetric all extremities   Skin:  Skin color, texture, turgor normal, no rashes or lesions.  Lymph nodes:  Cervical, supraclavicular, and axillary nodes normal   Neurologic:  CNII-XII intact, normal strength, sensation and gait; reflexes 2+ and symmetric throughout. Alert and oriented. Informal mini-mental status exam performed--last year she only had 1/3 recall after 5 minutes.  Repeated today and she had no problems--normal recall of all 3 items, spelled WORLD backwards very quickly.   Psych: Normal mood, affect, hygiene and grooming.      Chemistry      Component Value Date/Time   NA 137 12/19/2013 1537   K 4.2 12/19/2013 1537   CL 103 12/19/2013 1537   CO2 27 12/19/2013 1537   BUN 14  12/19/2013 1537   CREATININE 0.76 12/19/2013 1537   CREATININE 0.8 07/23/2012 1028      Component Value Date/Time   CALCIUM 9.3 12/19/2013 1537   ALKPHOS 57 12/19/2013 1537   AST 22 12/19/2013 1537   ALT 19 12/19/2013 1537   BILITOT 0.4 12/19/2013 1537     nonfasting glucose was 104  Lab Results  Component Value Date   WBC 4.2 12/19/2013   HGB 13.9 12/19/2013   HCT 40.0 12/19/2013   MCV 98.0 12/19/2013   PLT 240 12/19/2013   Lab Results  Component Value Date   TSH 1.063 12/19/2013   ASSESSMENT/PLAN:  Medicare annual wellness visit, subsequent  Mixed hyperlipidemia - Plan: Lipid panel, pravastatin (PRAVACHOL) 40 MG tablet  Hypothyroidism, unspecified hypothyroidism type - normal TSH in October.  euthyroid by history.  Continue current dose  Paroxysmal atrial fibrillation - infrequent symptoms; on Eliquis without complication  Obstructive sleep apnea - on CPAP  Osteopenia - due for DEXA, ordered.  continue calcium, vitamin D and weight-bearing exercise - Plan: DG Bone Density  Immunization due - Plan: Pneumococcal conjugate vaccine 13-valent  Screening for diabetes mellitus - Plan: Glucose, random  Lactose intolerance - reviewed lactose-free diet, Lactaid tablet use  Encouraged regular exercise and adequate hydration to help with her leg discomfort, and to keep feet warm.  Arch supports recommend.  F/u with Podiatrist if ongoing problems  Discussed monthly self breast exams and yearly mammograms; at least 30 minutes of aerobic activity at least 5 days/week; proper sunscreen use reviewed; healthy diet, including goals of calcium and vitamin D intake and alcohol recommendations (less than or equal to 1 drink/day) reviewed; regular seatbelt use; changing batteries in smoke detectors. Immunization recommendations discussed--Prevnar-13 today. Colonoscopy recommendations reviewed, UTD.  DEXA due  MOST form reviewed and updated, full care.   Medicare Attestation I have  personally reviewed: The patient's medical and social history Their use of alcohol, tobacco or illicit drugs Their current medications and supplements The patient's functional ability including ADLs,fall risks, home safety risks, cognitive, and hearing and visual impairment Diet and physical activities Evidence for depression or mood disorders  The patient's weight, height, BMI, and visual acuity have been recorded in the chart.  I have made referrals, counseling, and provided education to the patient based on review of the above.Marland Kitchen     Evarose Altland A, MD   03/27/2014

## 2014-03-28 ENCOUNTER — Encounter: Payer: Self-pay | Admitting: Family Medicine

## 2014-03-28 DIAGNOSIS — R7301 Impaired fasting glucose: Secondary | ICD-10-CM | POA: Insufficient documentation

## 2014-03-30 ENCOUNTER — Other Ambulatory Visit: Payer: Self-pay | Admitting: *Deleted

## 2014-03-30 DIAGNOSIS — E782 Mixed hyperlipidemia: Secondary | ICD-10-CM

## 2014-03-30 DIAGNOSIS — R7301 Impaired fasting glucose: Secondary | ICD-10-CM

## 2014-04-10 ENCOUNTER — Other Ambulatory Visit: Payer: Self-pay | Admitting: Family Medicine

## 2014-04-11 ENCOUNTER — Ambulatory Visit
Admission: RE | Admit: 2014-04-11 | Discharge: 2014-04-11 | Disposition: A | Discharge status: Home / Self Care | Payer: Medicare Other | Source: Ambulatory Visit | Attending: Family Medicine | Admitting: Family Medicine

## 2014-04-11 ENCOUNTER — Other Ambulatory Visit: Payer: Medicare Other

## 2014-04-11 DIAGNOSIS — M85852 Other specified disorders of bone density and structure, left thigh: Secondary | ICD-10-CM | POA: Diagnosis not present

## 2014-04-11 DIAGNOSIS — M85832 Other specified disorders of bone density and structure, left forearm: Secondary | ICD-10-CM | POA: Diagnosis not present

## 2014-04-11 DIAGNOSIS — M8580 Other specified disorders of bone density and structure, unspecified site: Secondary | ICD-10-CM

## 2014-04-11 DIAGNOSIS — Z780 Asymptomatic menopausal state: Secondary | ICD-10-CM | POA: Diagnosis not present

## 2014-04-11 LAB — HM DEXA SCAN

## 2014-04-13 ENCOUNTER — Other Ambulatory Visit: Payer: Self-pay | Admitting: *Deleted

## 2014-04-13 MED ORDER — IBANDRONATE SODIUM 150 MG PO TABS: 150.0000 mg | ORAL_TABLET | ORAL | Status: DC

## 2014-04-21 ENCOUNTER — Other Ambulatory Visit: Payer: Self-pay | Admitting: Family Medicine

## 2014-04-24 ENCOUNTER — Encounter: Payer: Self-pay | Admitting: Pulmonary Disease

## 2014-04-24 ENCOUNTER — Ambulatory Visit (INDEPENDENT_AMBULATORY_CARE_PROVIDER_SITE_OTHER): Payer: Medicare Other | Admitting: Pulmonary Disease

## 2014-04-24 VITALS — BP 110/68 | HR 53 | Temp 97.0°F | Ht <= 58 in | Wt 129.2 lb

## 2014-04-24 DIAGNOSIS — G4733 Obstructive sleep apnea (adult) (pediatric): Secondary | ICD-10-CM

## 2014-04-24 NOTE — Progress Notes (Signed)
   Subjective:    Patient ID: Abigail FearsMary B Cheramie, female    DOB: [birth year], 81 y.o.   MRN: [id number]  HPI Patient comes in today for follow-up of her obstructive sleep apnea. She is wearing C Pap compliantly by her history, and is having no issues with pressure.  She is having some issues with her nasal pillows, and would like to look at some other alternatives. She feels that she sleeps very well with the device, but continues to have issues with nocturia. She is also unsure if her pressure is set appropriately.   Review of Systems  Constitutional: Negative for fever and unexpected weight change.  HENT: Negative for congestion, dental problem, ear pain, nosebleeds, postnasal drip, rhinorrhea, sinus pressure, sneezing, sore throat and trouble swallowing.   Eyes: Negative for redness and itching.  Respiratory: Negative for cough, chest tightness, shortness of breath and wheezing.   Cardiovascular: Negative for palpitations and leg swelling.  Gastrointestinal: Negative for nausea and vomiting.  Genitourinary: Negative for dysuria.  Musculoskeletal: Negative for joint swelling.  Skin: Negative for rash.  Neurological: Negative for headaches.  Hematological: Does not bruise/bleed easily.  Psychiatric/Behavioral: Negative for dysphoric mood. The patient is not nervous/anxious.        Objective:   Physical Exam Overweight female in no acute distress Nose without purulence or discharge noted Neck without lymphadenopathy or thyromegaly No skin breakdown or pressure necrosis from the C Pap mask Lower extremities with mild edema, no cyanosis Alert and oriented, does not appear to be sleepy, moves all 4 extremities.       Assessment & Plan:

## 2014-04-24 NOTE — Patient Instructions (Signed)
Continue on cpap, and look at other mask choices for a better fit. Please take your SD card from your device for download.  I will send an order to them Work on weight loss followup with me again in one year if doing well.

## 2014-04-24 NOTE — Assessment & Plan Note (Signed)
The patient appears to be doing fairly well with her CPAP device, and is having some mask issues that need to be adjusted. I have asked her to get back with her home care company to look at other options, also get a download off her machine to make sure things are working properly. Finally, I have encouraged her to work aggressively on weight loss.

## 2014-04-28 ENCOUNTER — Other Ambulatory Visit: Payer: Self-pay | Admitting: Family Medicine

## 2014-05-02 ENCOUNTER — Encounter: Payer: Self-pay | Admitting: Internal Medicine

## 2014-05-03 DIAGNOSIS — H26493 Other secondary cataract, bilateral: Secondary | ICD-10-CM | POA: Diagnosis not present

## 2014-05-03 DIAGNOSIS — H40013 Open angle with borderline findings, low risk, bilateral: Secondary | ICD-10-CM | POA: Diagnosis not present

## 2014-05-03 DIAGNOSIS — H10413 Chronic giant papillary conjunctivitis, bilateral: Secondary | ICD-10-CM | POA: Diagnosis not present

## 2014-05-03 DIAGNOSIS — H02834 Dermatochalasis of left upper eyelid: Secondary | ICD-10-CM | POA: Diagnosis not present

## 2014-05-03 DIAGNOSIS — H3531 Nonexudative age-related macular degeneration: Secondary | ICD-10-CM | POA: Diagnosis not present

## 2014-05-03 DIAGNOSIS — H02831 Dermatochalasis of right upper eyelid: Secondary | ICD-10-CM | POA: Diagnosis not present

## 2014-05-03 DIAGNOSIS — Z961 Presence of intraocular lens: Secondary | ICD-10-CM | POA: Diagnosis not present

## 2014-05-03 DIAGNOSIS — H04123 Dry eye syndrome of bilateral lacrimal glands: Secondary | ICD-10-CM | POA: Diagnosis not present

## 2014-05-08 DIAGNOSIS — H26492 Other secondary cataract, left eye: Secondary | ICD-10-CM | POA: Diagnosis not present

## 2014-05-16 DIAGNOSIS — H26492 Other secondary cataract, left eye: Secondary | ICD-10-CM | POA: Diagnosis not present

## 2014-05-22 ENCOUNTER — Telehealth: Payer: Self-pay | Admitting: Pulmonary Disease

## 2014-05-22 NOTE — Telephone Encounter (Signed)
Pt returned Mindy's call - [phone]

## 2014-05-22 NOTE — Telephone Encounter (Signed)
Called and spoke to pt. [facility]. Pt verbalized understanding and stated her new mask is on the way. Pt stated she'll call back if she hasn't received it relatively soon. Nothing further needed at this time.

## 2014-05-22 NOTE — Telephone Encounter (Signed)
Pt returning call can be reached @ [phone].Caren GriffinsStanley A Dalton

## 2014-05-22 NOTE — Telephone Encounter (Signed)
Please let pt know that her download shows great control of her sleep apnea, and therefore her pressure level is adequate.  She does have a little more mask leak than I would like to see, and hopefully she has found a mask that fits better.

## 2014-05-22 NOTE — Telephone Encounter (Signed)
lmomtcb x1 for pt 

## 2014-06-27 ENCOUNTER — Other Ambulatory Visit: Payer: Medicare Other

## 2014-06-27 DIAGNOSIS — R7301 Impaired fasting glucose: Secondary | ICD-10-CM

## 2014-06-27 DIAGNOSIS — E782 Mixed hyperlipidemia: Secondary | ICD-10-CM | POA: Diagnosis not present

## 2014-06-27 LAB — LIPID PANEL
CHOL/HDL RATIO: 3.2 ratio
Cholesterol: 148 mg/dL (ref 0–200)
HDL: 46 mg/dL (ref >=46)
LDL CALC: 79 mg/dL (ref 0–99)
TRIGLYCERIDES: 113 mg/dL (ref <150)
VLDL: 23 mg/dL (ref 0–40)

## 2014-06-27 LAB — HEMOGLOBIN A1C
HEMOGLOBIN A1C: 5.6 % (ref <5.7)
MEAN PLASMA GLUCOSE: 114 mg/dL (ref <117)

## 2014-06-27 LAB — GLUCOSE, RANDOM: Glucose, Bld: 85 mg/dL (ref 70–99)

## 2014-06-29 ENCOUNTER — Encounter: Payer: Self-pay | Admitting: Family Medicine

## 2014-06-29 ENCOUNTER — Ambulatory Visit (INDEPENDENT_AMBULATORY_CARE_PROVIDER_SITE_OTHER): Payer: Medicare Other | Admitting: Family Medicine

## 2014-06-29 VITALS — BP 122/72 | HR 60 | Ht <= 58 in | Wt 126.0 lb

## 2014-06-29 DIAGNOSIS — I480 Paroxysmal atrial fibrillation: Secondary | ICD-10-CM

## 2014-06-29 DIAGNOSIS — Z5181 Encounter for therapeutic drug level monitoring: Secondary | ICD-10-CM

## 2014-06-29 DIAGNOSIS — R32 Unspecified urinary incontinence: Secondary | ICD-10-CM | POA: Diagnosis not present

## 2014-06-29 DIAGNOSIS — E739 Lactose intolerance, unspecified: Secondary | ICD-10-CM | POA: Diagnosis not present

## 2014-06-29 DIAGNOSIS — E039 Hypothyroidism, unspecified: Secondary | ICD-10-CM

## 2014-06-29 DIAGNOSIS — E782 Mixed hyperlipidemia: Secondary | ICD-10-CM | POA: Diagnosis not present

## 2014-06-29 DIAGNOSIS — G4733 Obstructive sleep apnea (adult) (pediatric): Secondary | ICD-10-CM | POA: Diagnosis not present

## 2014-06-29 DIAGNOSIS — R7301 Impaired fasting glucose: Secondary | ICD-10-CM | POA: Diagnosis not present

## 2014-06-29 DIAGNOSIS — M8580 Other specified disorders of bone density and structure, unspecified site: Secondary | ICD-10-CM | POA: Diagnosis not present

## 2014-06-29 MED ORDER — IBANDRONATE SODIUM 150 MG PO TABS: 150.0000 mg | ORAL_TABLET | ORAL | Status: DC

## 2014-06-29 NOTE — Patient Instructions (Signed)
  Make sure that you stay well hydrated, but drink it earlier in the day so it doesn't keep you awake as often at night. Consider using a medication such as ditropan (oxybutynin--or Detrol, Vesicare, Toviaz-these are all in the same class of medications.  The ditropan is generic and least expensive).  Your sugar and triglycerides are much better.  Keep up the good diet and fish oil.

## 2014-06-29 NOTE — Progress Notes (Signed)
Chief Complaint  Patient presents with  . Hyperlipidemia    nonfasting med check, labs alreasy done.    Patient presents for med check.  At her last visit she was noted to have elevated fasting glucose (104) and elevated triglycerides.  She presents today to follow up, after making significant changes.  Mixed hyperlipidemia:  She increased her fish oil to 3/day.  She goes to the [facility] 3x/week (elliptical and bicycle), and she is going to workout facility where she lives three times/week--weight-bearing exercises and elliptical. She is taking smaller portions for her meals, eating more salads at lunch.  She cut back on her sweets. She lost 3# since her last visit here. Compliant with medications and denies medication side effects. Denies any myalgias or joint pains.   She is getting up to go to the bathroom, sometimes up to 4-5 times/night.  She has been trying to drink more water overall, and had been having 2 glasses of water with dinner.  She cut back to just 1 glass with dinner, but doesn't notice much difference; it is not every night. She has some water at 9pm along with her cholesterol medication. No other fluids.  Gets up 3x/night at least, on most days, sometimes 5, but does recall a time where she only got up once. It is a hassle because she has to stop the CPAP every time.   She drinks one cup of coffee each morning.  She has some frequency during the day as well, about every 2 hours.   Atrial fibrillation:  She remains on blood thinners without any bleeding or complications.  She denies tachycardia. She wakes up sometimes at night and feels like her heart is faster. Wondering if this contributes to her urinary frequency. She denies dysuria.  Occasionally notices a smell intermittently. She denies chest pain, shortness of breath (only after 3 flights of stairs, none when exercising at the gym). Last visit with Dr. Martinique was 01/2014, nuclear stress test 08/2013 with normal EF, no ischemia.  She sees him every 6 months, due again in May.   HTN:  Occasionally checks, and numbers are usually 130/70 range.    OSA:  She is using nasal CPAP, and doing well. Followed by Dr. Gwenette Greet, saw him recently.  Doing well. She feels more refreshed since being on CPAP.  Osteopenia--She restarted Boniva at her last visit.( She had stopped it after reading about risks in the paper (on her own) prior to her last visit). She hasn't had any side effects.  Is asking for a 90 day prescription. She had DEXA in 04/2014: T-1.9 at L femur, -1.6 at L forearm.  PMH, PSH, SH reviewed. Outpatient Encounter Prescriptions as of 06/29/2014  Medication Sig Note  . apixaban (ELIQUIS) 5 MG TABS tablet Take 1 tablet (5 mg total) by mouth 2 (two) times daily.   . Biotin 5000 MCG TABS Take 1 tablet by mouth daily.   . Calcium Carbonate-Vitamin D (CALCIUM 600 + D PO) Take 2 tablets by mouth daily.     . Cholecalciferol (VITAMIN D) 1000 UNITS capsule Take 1,000 Units by mouth daily.     Marland Kitchen co-enzyme Q-10 30 MG capsule Take 30 mg by mouth daily.     Marland Kitchen CRANBERRY PO Take by mouth.   . ibandronate (BONIVA) 150 MG tablet Take 1 tablet (150 mg total) by mouth every 30 (thirty) days.   Marland Kitchen lactase (LACTAID) 3000 UNITS tablet Take 3,000 Units by mouth 3 (three) times daily with meals.  03/27/2014: Takes 1 prior to breakfast and lunch, 2 prior to dinner  . levothyroxine (SYNTHROID, LEVOTHROID) 75 MCG tablet TAKE ONE TABLET BY MOUTH ONCE DAILY   . metoprolol tartrate (LOPRESSOR) 25 MG tablet Take 1 tablet (25 mg total) by mouth 2 (two) times daily.   . Misc Natural Products (OSTEO BI-FLEX JOINT SHIELD PO) Take 2 tablets by mouth daily.   . Multiple Vitamin (MULTI-VITAMIN) tablet Take 1 tablet by mouth daily.     . Multiple Vitamins-Minerals (ICAPS PO) Take 1 tablet by mouth daily.   . Omega-3 Fatty Acids (FISH OIL PO) Take 1,200 mg by mouth 3 (three) times daily.  06/29/2014: Takes 3 daily  . pravastatin (PRAVACHOL) 40 MG tablet Take 2  tablets (80 mg total) by mouth daily.   . [DISCONTINUED] ibandronate (BONIVA) 150 MG tablet Take 1 tablet (150 mg total) by mouth every 30 (thirty) days.   . [DISCONTINUED] levothyroxine (SYNTHROID, LEVOTHROID) 75 MCG tablet TAKE ONE TABLET BY MOUTH ONCE DAILY    No Known Allergies  ROS:  No fevers, chills, headaches, dizziness, chest pain. No nausea, vomiting. Bowels (diarrhea) are better since taking "gas pills" and using the Lactaid prior to meals. +urinary frequency, but no dysuria, hematuria.  No bleeding, bruising, rash.  No URI symptoms.  No depression, anxiety.  See HPI.  PHYSICAL EXAM: BP 122/72 mmHg  Pulse 60  Ht $R'4\' 10"'kH$  (1.473 m)  Wt 126 lb (57.153 kg)  BMI 26.34 kg/m2 Well developed, pleasant female in no distress HEENT: PERRL, EOMI, conjunctiva clear, OP clear Neck: no lymphadenopathy or thyromegaly, no carotid bruit Heart: sounds to be in regular rhythm, no murmur Lungs: clear bilaterally Abdomen: soft, no organomegaly or mass. Extremities: no edema, normal pulses Skin: no rashes or bruising Neuro: alert and oriented, cranial nerves intact, normal strength, gait Psych:  Normal mood, affect, hygiene and grooming  Labs: Fasting glucose 85  Lab Results  Component Value Date   CHOL 148 06/27/2014   HDL 46 06/27/2014   LDLCALC 79 06/27/2014   TRIG 113 06/27/2014   CHOLHDL 3.2 06/27/2014   Lab Results  Component Value Date   HGBA1C 5.6 06/27/2014   ASSESSMENT/PLAN:  Mixed hyperlipidemia - at goal, improved  Hypothyroidism, unspecified hypothyroidism type - euthyroid by history. recheck TSH in 6 mos  Paroxysmal atrial fibrillation - continue to follow with cardiology.    Obstructive sleep apnea - doin well on CPAP  Osteopenia - Plan: ibandronate (BONIVA) 150 MG tablet  Impaired fasting glucose - improved with dietary improvement, increased exercise and weight loss.    Lactose intolerance  Urinary incontinence, unspecified incontinence type - discussed  behavioral techniques, and medications.  not interested in medication at this time, will consider if cannot improve symptoms on her own   Discussed oxybutynin.  She prefers to work on trying to control it on her own, rather than take a medication   Make sure that you stay well hydrated, but drink it earlier in the day so it doesn't keep you awake as often at night. Consider using a medication such as ditropan (oxybutynin--or Detrol, Vesicare, Toviaz-these are all in the same class of medications.  The ditropan is generic and least expensive).  Your sugar and triglycerides are much better.  Keep up the good diet and fish oil.   TSH, CBC, lipid, c-met in 6 months; med check after   Keep the CPE for January.

## 2014-07-27 ENCOUNTER — Encounter: Payer: Self-pay | Admitting: Cardiology

## 2014-07-27 ENCOUNTER — Ambulatory Visit (INDEPENDENT_AMBULATORY_CARE_PROVIDER_SITE_OTHER): Payer: Medicare Other | Admitting: Cardiology

## 2014-07-27 VITALS — BP 122/64 | HR 76 | Ht <= 58 in | Wt 123.9 lb

## 2014-07-27 DIAGNOSIS — I480 Paroxysmal atrial fibrillation: Secondary | ICD-10-CM

## 2014-07-27 DIAGNOSIS — E782 Mixed hyperlipidemia: Secondary | ICD-10-CM | POA: Diagnosis not present

## 2014-07-27 NOTE — Progress Notes (Signed)
Abigail Day Date of Birth: 12-29-1933 Medical Record #[id number]  History of Present Illness: [name] is seen for follow up of Afib. She has a history of paroxysmal atrial fib - on Eliquis. EF is normal per echo back in December of 2013. Other issues include HLD, hypothyroidism with prior ablation for hyperthyroidism, and diverticulosis. She is  on CPAP therapy for sleep apnea-followed by pulmonary. On follow up today she is feeling very well. She still notes occasional palpitations at night sometimes associated with increased urination. No SOB or CP. No dizziness. Her labs showed elevation of triglycerides. She started fish oil. She also started walking a lot more and lost weight. Last triglyceride level was normal. Overall she feels well.   Current Outpatient Prescriptions on File Prior to Visit  Medication Sig Dispense Refill  . apixaban (ELIQUIS) 5 MG TABS tablet Take 1 tablet (5 mg total) by mouth 2 (two) times daily. 180 tablet 3  . Biotin 5000 MCG TABS Take 1 tablet by mouth daily.    . Calcium Carbonate-Vitamin D (CALCIUM 600 + D PO) Take 2 tablets by mouth daily.      . Cholecalciferol (VITAMIN D) 1000 UNITS capsule Take 1,000 Units by mouth daily.      Marland Kitchen. co-enzyme Q-10 30 MG capsule Take 30 mg by mouth daily.      Marland Kitchen. CRANBERRY PO Take by mouth.    . ibandronate (BONIVA) 150 MG tablet Take 1 tablet (150 mg total) by mouth every 30 (thirty) days. 3 tablet 3  . lactase (LACTAID) 3000 UNITS tablet Take 3,000 Units by mouth 3 (three) times daily with meals.    Marland Kitchen. levothyroxine (SYNTHROID, LEVOTHROID) 75 MCG tablet TAKE ONE TABLET BY MOUTH ONCE DAILY 90 tablet 0  . metoprolol tartrate (LOPRESSOR) 25 MG tablet Take 1 tablet (25 mg total) by mouth 2 (two) times daily. 180 tablet 3  . Misc Natural Products (OSTEO BI-FLEX JOINT SHIELD PO) Take 2 tablets by mouth daily.    . Multiple Vitamin (MULTI-VITAMIN) tablet Take 1 tablet by mouth daily.      . Multiple Vitamins-Minerals (ICAPS PO) Take 1  tablet by mouth daily.    . Omega-3 Fatty Acids (FISH OIL PO) Take 1,200 mg by mouth 3 (three) times daily.     . pravastatin (PRAVACHOL) 40 MG tablet Take 2 tablets (80 mg total) by mouth daily. 180 tablet 3   No current facility-administered medications on file prior to visit.    No Known Allergies  Past Medical History  Diagnosis Date  . Pure hypercholesterolemia   . Osteopenia   . Diverticulosis   . Urge urinary incontinence   . Hemorrhoids     internal and external  . Glaucoma suspect   . Unspecified hypothyroidism     s/p ablation for hyperthyroidism  . Hyperplastic colon polyp 09/25/10    Dr. Ewing SchleinMagod  . Hemorrhoids 09/25/10    internal and external  . Palpitations 02/18/2012  . Dyspnea on exertion 02/18/2012  . Paroxysmal atrial fibrillation   . Sleep apnea     Past Surgical History  Procedure Laterality Date  . Abdominal hysterectomy  age 81    with BSO and bladder tack  . Eye surgery      bilateral cataract surgery  . Colonoscopy  11/06, 09/25/10    hyperplastic polyp 2012  . Laparoscopic nissen fundoplication  8/07    Dr. Daphine DeutscherMartin  . Esophagogastroduodenoscopy  09/25/10    tortuous esophagus, intact Nissen fundoplication, dilated the  spasm at esophageal GE junction    History  Smoking status  . Never Smoker   Smokeless tobacco  . Never Used    History  Alcohol Use  . Yes    Comment: once a month    Family History  Problem Relation Age of Onset  . Heart disease Mother   . Heart disease Father   . Diabetes Father   . Marfan syndrome Sister   . Heart disease Sister     valve replacement  . Heart disease Brother   . Diabetes Brother   . Heart disease Brother   . Diabetes Brother   . Heart disease Brother   . Heart disease Brother   . Heart disease Brother     CABG  . Heart disease Brother     CABG  . Heart disease Sister     pacemaker  . Cancer Sister 45    breast cancer  . Aortic aneurysm Sister   . Cancer Sister 35    colon cancer  .  Heart disease Sister     CABG  . Heart disease Brother   . Heart disease Sister   . Hyperlipidemia Sister   . Stroke Sister     Review of Systems: The review of systems is per the HPI.  All other systems were reviewed and are negative.  Physical Exam: BP 122/64 mmHg  Pulse 76  Ht  (1.473 m)  Wt 56.201 kg (123 lb 14.4 oz)  BMI 25.90 kg/m2 Patient is very pleasant and in no acute distress. Skin is warm and dry. Color is normal.  HEENT is unremarkable. Normocephalic/atraumatic. PERRL. Sclera are nonicteric. Neck is supple. No masses. No JVD. Lungs are clear. Cardiac exam shows a regular rate and rhythm. No gallop or murmur. Abdomen is soft. Extremities are without edema. Gait and ROM are intact. No gross neurologic deficits noted.  LABORATORY DATA:     Lab Results  Component Value Date   WBC 4.2 12/19/2013   HGB 13.9 12/19/2013   HCT 40.0 12/19/2013   PLT 240 12/19/2013   GLUCOSE 85 06/27/2014   CHOL 148 06/27/2014   TRIG 113 06/27/2014   HDL 46 06/27/2014   LDLCALC 79 06/27/2014   ALT 19 12/19/2013   AST 22 12/19/2013   NA 137 12/19/2013   K 4.2 12/19/2013   CL 103 12/19/2013   CREATININE 0.76 12/19/2013   BUN 14 12/19/2013   CO2 27 12/19/2013   TSH 1.063 12/19/2013   HGBA1C 5.6 06/27/2014     Assessment / Plan:  1. Obstructive sleep apnea. Now on CPAP therapy  2. PAF - on beta blocker and Eliquis - she is in sinus by exam today. Minimally symptomatic.  I'll followup again in 6 months.   3. Low risk myoview study.

## 2014-07-27 NOTE — Patient Instructions (Signed)
Continue your current therapy  I will see you in 6 months.   

## 2014-08-01 ENCOUNTER — Other Ambulatory Visit: Payer: Self-pay

## 2014-08-01 MED ORDER — METOPROLOL TARTRATE 25 MG PO TABS: 25.0000 mg | ORAL_TABLET | Freq: Two times a day (BID) | ORAL | Status: DC

## 2014-10-09 ENCOUNTER — Ambulatory Visit (INDEPENDENT_AMBULATORY_CARE_PROVIDER_SITE_OTHER): Payer: Medicare Other | Admitting: Family Medicine

## 2014-10-09 ENCOUNTER — Encounter: Payer: Self-pay | Admitting: Family Medicine

## 2014-10-09 VITALS — BP 110/64 | HR 56 | Temp 97.7°F | Ht <= 58 in | Wt 127.0 lb

## 2014-10-09 DIAGNOSIS — J309 Allergic rhinitis, unspecified: Secondary | ICD-10-CM

## 2014-10-09 DIAGNOSIS — R109 Unspecified abdominal pain: Secondary | ICD-10-CM

## 2014-10-09 DIAGNOSIS — J3489 Other specified disorders of nose and nasal sinuses: Secondary | ICD-10-CM | POA: Diagnosis not present

## 2014-10-09 DIAGNOSIS — M545 Low back pain: Secondary | ICD-10-CM

## 2014-10-09 DIAGNOSIS — R42 Dizziness and giddiness: Secondary | ICD-10-CM | POA: Diagnosis not present

## 2014-10-09 LAB — POCT URINALYSIS DIPSTICK
Bilirubin, UA: NEGATIVE
Blood, UA: NEGATIVE
GLUCOSE UA: NEGATIVE
Ketones, UA: NEGATIVE
LEUKOCYTES UA: NEGATIVE
Nitrite, UA: NEGATIVE
Protein, UA: NEGATIVE
SPEC GRAV UA: 1.02
UROBILINOGEN UA: NEGATIVE
pH, UA: 6

## 2014-10-09 NOTE — Progress Notes (Signed)
Chief Complaint  Patient presents with  . Back Pain    right sided lbp back in her kidney area. Having some lightheadedness when she bend over, wonders if she has an inner ear infection. Symptoms have been for about a week. Has a slight chill once in a while. Also mentions that her rash (on lower adb) has flared up again, seeing derm for this but thinks it flares up when others things begin to go wrong.   She gets dizzy when she leans forward and raises up, needs to get up slowly and steady herself.  She has some tightness across her sinuses, wondering if she is having an inner ear problem.  She noticed last week that her urine had a bad odor to it.  She spoke to the house nurse at [facility], who told her drink more water.  The odor has improved.  She was up 5 times to void last night. She denies dysuria or urgency.  No vaginal discharge. She is having some pain on her right side/flank area.  She presents today for evaluation of her dizziness and to rule out a urinary or kidney infection.  She does have some postnasal drainage.  She hasn't been able to tolerate the CPAP in the last couple of night due to sinus pressure.  Nasal mucus is clear.  She has a slight cough, gets up some thick mucus, slightly yellow-tinted.  Denies any shortness of breath.  Denies any vertigo, describes it as lightheaded..  Flare of itchy rash along the sides of both abdomen. She has had this in the past, treated by dermatologist with "a salve".  It is along the waistband of her pants.  She has been using the cream prn, and it always clears it up.  It has been helping.  She stopped taking her coenzyme Q10 a week ago, and has noticed some discomfort in her legs since stopping it.  PMH, PSH, SH reviewed.  Outpatient Encounter Prescriptions as of 10/09/2014  Medication Sig Note  . apixaban (ELIQUIS) 5 MG TABS tablet Take 1 tablet (5 mg total) by mouth 2 (two) times daily.   . Biotin 5000 MCG TABS Take 1 tablet by mouth  daily.   . Calcium Carbonate-Vitamin D (CALCIUM 600 + D PO) Take 2 tablets by mouth daily.     . Cholecalciferol (VITAMIN D) 1000 UNITS capsule Take 1,000 Units by mouth daily.     Marland Kitchen CRANBERRY PO Take by mouth.   . ibandronate (BONIVA) 150 MG tablet Take 1 tablet (150 mg total) by mouth every 30 (thirty) days.   Marland Kitchen lactase (LACTAID) 3000 UNITS tablet Take 3,000 Units by mouth 3 (three) times daily with meals. 03/27/2014: Takes 1 prior to breakfast and lunch, 2 prior to dinner  . levothyroxine (SYNTHROID, LEVOTHROID) 75 MCG tablet TAKE ONE TABLET BY MOUTH ONCE DAILY   . metoprolol tartrate (LOPRESSOR) 25 MG tablet Take 1 tablet (25 mg total) by mouth 2 (two) times daily.   . Misc Natural Products (OSTEO BI-FLEX JOINT SHIELD PO) Take 2 tablets by mouth daily.   . Multiple Vitamin (MULTI-VITAMIN) tablet Take 1 tablet by mouth daily.     . Multiple Vitamins-Minerals (ICAPS PO) Take 1 tablet by mouth daily.   . Omega-3 Fatty Acids (FISH OIL PO) Take 1,200 mg by mouth 3 (three) times daily.  06/29/2014: Takes 3 daily  . pravastatin (PRAVACHOL) 40 MG tablet Take 2 tablets (80 mg total) by mouth daily.   Marland Kitchen co-enzyme Q-10 30  MG capsule Take 30 mg by mouth daily.      No facility-administered encounter medications on file as of 10/09/2014.   No Known Allergies  ROS: no fever, chills, headache, dizziness, chest pain, palpitations, edema, bleeding, bruising, rash, numbness, tingling, nausea, vomiting, bowel changes or other concerns.  See HPI.  PHYSICAL EXAM: BP 110/64 mmHg  Pulse 56  Temp(Src) 97.7 F (36.5 C) (Tympanic)  Ht  (1.473 m)  Wt 127 lb (57.607 kg)  BMI 26.55 kg/m2  Well developed, pleasant, elderly female in no distress HEENT: PERRL, EOMi, conjunctiva clear.  Tm's and EAC's normal. Nasal mucosa is moderately edematous, pale/blue. No erythema or purulence. Mildly tender over maxillary sinuses bilaterally. OP is clear Neck: no lymphadenopathy, thyromegaly or carotid bruit Heart:  regular rate and rhythm Lungs: clear bilaterally Back: no CVA tenderness.  Area of discomfort is along the inferior ribs, superficially, on the right. Skin: Patches of erythema (not raised) laterally at her waistband bilaterally.  No excoriations, drainage, pustules, just flat patch of mild redness. Extremities: no edema, normal pulses Neuro: alert and oriented. Normal gait, cranial nerves, strength   Urine dip: normal, SG 1.020   ASSESSMENT/PLAN:  Right low back pain, with sciatica presence unspecified - suspect MSK, no urinary infection.  heat, tylenol prn (cannot take NSAIDs due to anticoagulation - Plan: POCT Urinalysis Dipstick  Allergic rhinitis, unspecified allergic rhinitis type  Sinus pressure - no evidence of sinus infection; suspect congestion. claritin, sinus rinses  Lightheadedness  Right flank pain   I suspect that you are a little dehydrated, along with some allergies, both of which can contribute to the dizziness.  Your urine seemed concentrated--please try and drink more water each day (drink as much during the day, and not as much in the evening, so that you aren't awakened too many times to go to the bathroom at night).  Goal is at least 8 glasses of water each day, more if exercising and perspiring in the heat.  I recommend that you take claritin ([employer] version is fine) every day, as well as consider trying sinus rinses (or neti-pot).  This will help with the sinus pressure you have in the cheeks.  I do not think there is a urinary infection.  I think your pain in your back is likely muscular/musculoskeletal, rather than kidney pain.  Warm compresses and tylenol should help. I know that the advil helped, but you really aren't supposed to take anti-inflammatories when on blood thinners.  Restart taking the Coenzyme Q10, as it sounds as though you were getting benefit from it (and now having leg symptoms since stopping it).  Continue to use the prescribed cream  for your rash, as it seems to be helping. Follow up with the dermatologist if it doesn't resolve.

## 2014-10-09 NOTE — Patient Instructions (Signed)
  I suspect that you are a little dehydrated, along with some allergies, both of which can contribute to the dizziness.  Your urine seemed concentrated--please try and drink more water each day (drink as much during the day, and not as much in the evening, so that you aren't awakened too many times to go to the bathroom at night).  Goal is at least 8 glasses of water each day, more if exercising and perspiring in the heat.  I recommend that you take claritin ([employer] version is fine) every day, as well as consider trying sinus rinses (or neti-pot).  This will help with the sinus pressure you have in the cheeks.  I do not think there is a urinary infection.  I think your pain in your back is likely muscular/musculoskeletal, rather than kidney pain.  Warm compresses and tylenol should help. I know that the advil helped, but you really aren't supposed to take anti-inflammatories when on blood thinners.  Restart taking the Coenzyme Q10, as it sounds as though you were getting benefit from it (and now having leg symptoms since stopping it).  Continue to use the prescribed cream for your rash, as it seems to be helping. Follow up with the dermatologist if it doesn't resolve.

## 2014-10-24 ENCOUNTER — Other Ambulatory Visit: Payer: Self-pay | Admitting: Family Medicine

## 2014-12-21 ENCOUNTER — Other Ambulatory Visit (INDEPENDENT_AMBULATORY_CARE_PROVIDER_SITE_OTHER): Payer: Medicare Other

## 2014-12-21 DIAGNOSIS — Z23 Encounter for immunization: Secondary | ICD-10-CM

## 2014-12-21 DIAGNOSIS — E039 Hypothyroidism, unspecified: Secondary | ICD-10-CM | POA: Diagnosis not present

## 2014-12-21 DIAGNOSIS — E782 Mixed hyperlipidemia: Secondary | ICD-10-CM

## 2014-12-21 DIAGNOSIS — Z5181 Encounter for therapeutic drug level monitoring: Secondary | ICD-10-CM | POA: Diagnosis not present

## 2014-12-21 DIAGNOSIS — R7301 Impaired fasting glucose: Secondary | ICD-10-CM

## 2014-12-21 LAB — COMPREHENSIVE METABOLIC PANEL
ALBUMIN: 4.4 g/dL (ref 3.6–5.1)
ALK PHOS: 47 U/L (ref 33–130)
ALT: 18 U/L (ref 6–29)
AST: 22 U/L (ref 10–35)
BUN: 16 mg/dL (ref 7–25)
CO2: 26 mmol/L (ref 20–31)
Calcium: 9.4 mg/dL (ref 8.6–10.4)
Chloride: 102 mmol/L (ref 98–110)
Creat: 0.7 mg/dL (ref 0.60–0.93)
GLUCOSE: 95 mg/dL (ref 65–99)
POTASSIUM: 4.4 mmol/L (ref 3.5–5.3)
Sodium: 137 mmol/L (ref 135–146)
Total Bilirubin: 0.5 mg/dL (ref 0.2–1.2)
Total Protein: 6.4 g/dL (ref 6.1–8.1)

## 2014-12-21 LAB — LIPID PANEL
CHOL/HDL RATIO: 3.6 ratio (ref <=5.0)
CHOLESTEROL: 175 mg/dL (ref 125–200)
HDL: 49 mg/dL (ref >=46)
LDL Cholesterol: 93 mg/dL (ref <130)
TRIGLYCERIDES: 163 mg/dL — AB (ref <150)
VLDL: 33 mg/dL — ABNORMAL HIGH (ref <30)

## 2014-12-21 LAB — CBC WITH DIFFERENTIAL/PLATELET
BASOS PCT: 1 % (ref 0–1)
Basophils Absolute: 0 10*3/uL (ref 0.0–0.1)
EOS ABS: 0.1 10*3/uL (ref 0.0–0.7)
Eosinophils Relative: 2 % (ref 0–5)
HCT: 39.7 % (ref 36.0–46.0)
Hemoglobin: 13.4 g/dL (ref 12.0–15.0)
Lymphocytes Relative: 27 % (ref 12–46)
Lymphs Abs: 0.9 10*3/uL (ref 0.7–4.0)
MCH: 33.5 pg (ref 26.0–34.0)
MCHC: 33.8 g/dL (ref 30.0–36.0)
MCV: 99.3 fL (ref 78.0–100.0)
MPV: 9.7 fL (ref 8.6–12.4)
Monocytes Absolute: 0.5 10*3/uL (ref 0.1–1.0)
Monocytes Relative: 16 % — ABNORMAL HIGH (ref 3–12)
NEUTROS PCT: 54 % (ref 43–77)
Neutro Abs: 1.7 10*3/uL (ref 1.7–7.7)
PLATELETS: 221 10*3/uL (ref 150–400)
RBC: 4 MIL/uL (ref 3.87–5.11)
RDW: 12.8 % (ref 11.5–15.5)
WBC: 3.2 10*3/uL — ABNORMAL LOW (ref 4.0–10.5)

## 2014-12-22 LAB — HEMOGLOBIN A1C
Hgb A1c MFr Bld: 5.6 % (ref <5.7)
Mean Plasma Glucose: 114 mg/dL (ref <117)

## 2014-12-22 LAB — TSH: TSH: 1.082 u[IU]/mL (ref 0.350–4.500)

## 2015-01-01 ENCOUNTER — Ambulatory Visit (INDEPENDENT_AMBULATORY_CARE_PROVIDER_SITE_OTHER): Payer: Medicare Other | Admitting: Family Medicine

## 2015-01-01 ENCOUNTER — Encounter: Payer: Self-pay | Admitting: Family Medicine

## 2015-01-01 VITALS — BP 110/70 | HR 60 | Resp 12 | Ht <= 58 in | Wt 128.0 lb

## 2015-01-01 DIAGNOSIS — I480 Paroxysmal atrial fibrillation: Secondary | ICD-10-CM | POA: Diagnosis not present

## 2015-01-01 DIAGNOSIS — R7301 Impaired fasting glucose: Secondary | ICD-10-CM | POA: Diagnosis not present

## 2015-01-01 DIAGNOSIS — E782 Mixed hyperlipidemia: Secondary | ICD-10-CM | POA: Diagnosis not present

## 2015-01-01 DIAGNOSIS — G4733 Obstructive sleep apnea (adult) (pediatric): Secondary | ICD-10-CM | POA: Diagnosis not present

## 2015-01-01 DIAGNOSIS — E039 Hypothyroidism, unspecified: Secondary | ICD-10-CM | POA: Diagnosis not present

## 2015-01-01 DIAGNOSIS — Q7033 Webbed toes, bilateral: Secondary | ICD-10-CM

## 2015-01-01 MED ORDER — LEVOTHYROXINE SODIUM 75 MCG PO TABS: 75.0000 ug | ORAL_TABLET | Freq: Every day | ORAL | Status: DC

## 2015-01-01 NOTE — Progress Notes (Signed)
Chief Complaint  Patient presents with  . Medication Management    med check   She was last seen in August, with dizziness and flank pain--symptoms improved after drinking more water. She sometimes gets some low back pain after a lot of exertion/exercise/cleaning house.  Mixed hyperlipidemia: She continues to take 3 fish oil daily, along with the pravastatin. Compliant with medications and denies medication side effects. She has some "crawling" discomfort in her legs, mainly when they are cold.  Denies cramps or muscle aches.  She sometimes has muscle soreness related to the machines she uses in the gym.  Urinary frequency.  She sometimes still gets up 3x/night.  Sometimes she feels like she doesn't empty well, and that is why she goes more frequently--this is intermittent.  We discussed oxybutynin at her visit 6 months ago, but declined trial. It is annoying mainly related to having to take CPAP on/off each time.   Atrial fibrillation: She remains on blood thinners without any bleeding or complications. She denies tachycardia--just a small spell every few months.  She denies chest pain, shortness of breath (only after 3 flights of stairs, none when exercising at the gym). Last visit with Dr. [name] was 07/2014, goes every 6 months.  HTN: Occasionally checks, and reports values are normal. No headaches, dizziness, chest pain.  OSA: She is using nasal CPAP, and doing well. She was previously followed by Dr. Shelle Iron, and is scheduled to see his replacement. Doing well. She feels more refreshed since being on CPAP.  She tires out during the day some still, but feels more refreshed in the morning.  Osteopenia--She restarted Boniva after her last bone density. She had stopped it after reading about risks in the paper (on her own) prior to her last visit). She hasn't had any side effects. She switched to taking it in the evening (but making sure to stay upright for an hour before bed) due to timing  with her thyroid medication. Denies any reflux, chest pain or other side effects.  She had DEXA in 04/2014: T-1.9 at L femur, -1.6 at L forearm.  Allergies--she has been taking a daily antihistamine (from [facility]--loratidine), which has improved her postnasal drainage and allergy symptoms.    PMH, PSH, SH reviewed, unchanged  Outpatient Encounter Prescriptions as of 01/01/2015  Medication Sig Note  . apixaban (ELIQUIS) 5 MG TABS tablet Take 1 tablet (5 mg total) by mouth 2 (two) times daily.   . Biotin 5000 MCG TABS Take 1 tablet by mouth daily.   . Calcium Carbonate-Vitamin D (CALCIUM 600 + D PO) Take 2 tablets by mouth daily.     . Cholecalciferol (VITAMIN D) 1000 UNITS capsule Take 1,000 Units by mouth daily.     Marland Kitchen co-enzyme Q-10 30 MG capsule Take 30 mg by mouth daily.     Marland Kitchen CRANBERRY PO Take by mouth.   . ibandronate (BONIVA) 150 MG tablet Take 1 tablet (150 mg total) by mouth every 30 (thirty) days.   Marland Kitchen lactase (LACTAID) 3000 UNITS tablet Take 3,000 Units by mouth 3 (three) times daily with meals. 03/27/2014: Takes 1 prior to breakfast and lunch, 2 prior to dinner  . levothyroxine (SYNTHROID, LEVOTHROID) 75 MCG tablet TAKE ONE TABLET BY MOUTH ONCE DAILY   . metoprolol tartrate (LOPRESSOR) 25 MG tablet Take 1 tablet (25 mg total) by mouth 2 (two) times daily.   . Misc Natural Products (OSTEO BI-FLEX JOINT SHIELD PO) Take 2 tablets by mouth daily.   . Multiple Vitamin (  MULTI-VITAMIN) tablet Take 1 tablet by mouth daily.     . Multiple Vitamins-Minerals (ICAPS PO) Take 1 tablet by mouth daily.   . Omega-3 Fatty Acids (FISH OIL PO) Take 1,200 mg by mouth 3 (three) times daily.  06/29/2014: Takes 3 daily  . pravastatin (PRAVACHOL) 40 MG tablet Take 2 tablets (80 mg total) by mouth daily.   . [DISCONTINUED] levothyroxine (SYNTHROID, LEVOTHROID) 75 MCG tablet TAKE ONE TABLET BY MOUTH ONCE DAILY    No facility-administered encounter medications on file as of 01/01/2015.  loratidine 10mg   daily  No Known Allergies  ROS negative except for intermittent drainage/congestion, improved with claritin. Some discomfort and numbness intermittently in her feet, related to her fused toes, better since using a better shoe insert. Denies ear pain, sore throat, cough, shortness of breath, GI or GU complaints except as noted in HPI. No bleeding, bruising, depression, joint pains or other concerns.  PHYSICAL EXAM: BP 110/70 mmHg  Pulse 60  Resp 12  Ht 4\' 10"  (1.473 m)  Wt 128 lb (58.06 kg)  BMI 26.76 kg/m2  SpO2 98%  Well developed, pleasant female in no distress HEENT: PERRL, EOMI, conjunctiva clear, OP clear Neck: no lymphadenopathy or thyromegaly, no carotid bruit Heart: sounds to be in regular rhythm, no murmur, rub, gallop Lungs: clear bilaterally Abdomen: soft, no organomegaly or mass. Extremities: no edema, normal pulses. 2nd and 3rd toes bilaterally are fused proximally, (separated only at the distal phalanx) No swelling, inflammation.  Very slight lateral deviation starting, no hammertoes. Skin: no rashes or bruising Neuro: alert and oriented, cranial nerves intact, normal strength, gait Psych: Normal mood, affect, hygiene and grooming   Lab Results  Component Value Date   TSH 1.082 12/21/2014   Lab Results  Component Value Date   WBC 3.2* 12/21/2014   HGB 13.4 12/21/2014   HCT 39.7 12/21/2014   MCV 99.3 12/21/2014   PLT 221 12/21/2014   Lab Results  Component Value Date   CHOL 175 12/21/2014   HDL 49 12/21/2014   LDLCALC 93 12/21/2014   TRIG 163* 12/21/2014   CHOLHDL 3.6 12/21/2014     Chemistry      Component Value Date/Time   NA 137 12/21/2014 0001   K 4.4 12/21/2014 0001   CL 102 12/21/2014 0001   CO2 26 12/21/2014 0001   BUN 16 12/21/2014 0001   CREATININE 0.70 12/21/2014 0001   CREATININE 0.8 07/23/2012 1028      Component Value Date/Time   CALCIUM 9.4 12/21/2014 0001   ALKPHOS 47 12/21/2014 0001   AST 22 12/21/2014 0001   ALT 18  12/21/2014 0001   BILITOT 0.5 12/21/2014 0001     Fasting glucose 95  Lab Results  Component Value Date   HGBA1C 5.6 12/21/2014   ASSESSMENT/PLAN:  Mixed hyperlipidemia - lowfat diet reviewed. 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 Reminded about limiting sweets in her diet, contuing the statin and fish oil  She brings in copy of Living Will. Didn't bring healthcare POA (has it at home, and will send copy).  Med check+/AWV in January Recheck CBC at her f/u visit.

## 2015-01-01 NOTE — Patient Instructions (Signed)
At your convenience, please get us copies of your healthcare power of attorney (you can bring to your January visit, or mail it). Continue all of your current medications, including the 3 fish oil capsules daily. Try and limit your sweets and sugar in your diet. Continue the regular exercise.  Your white blood count was just very slightly low. Your red cell counts were normal (no anemia). We will repeat this at your January visit.  You do not need any bloodwork in advance of your visit then, and you do not need to fast.

## 2015-01-16 ENCOUNTER — Other Ambulatory Visit: Payer: Self-pay

## 2015-01-16 DIAGNOSIS — Z1231 Encounter for screening mammogram for malignant neoplasm of breast: Secondary | ICD-10-CM

## 2015-01-18 ENCOUNTER — Ambulatory Visit (INDEPENDENT_AMBULATORY_CARE_PROVIDER_SITE_OTHER): Payer: Medicare Other | Admitting: Cardiology

## 2015-01-18 ENCOUNTER — Encounter: Payer: Self-pay | Admitting: Cardiology

## 2015-01-18 VITALS — BP 114/68 | HR 56 | Ht 59.0 in | Wt 125.1 lb

## 2015-01-18 DIAGNOSIS — I480 Paroxysmal atrial fibrillation: Secondary | ICD-10-CM

## 2015-01-18 MED ORDER — APIXABAN 5 MG PO TABS: 5.0000 mg | ORAL_TABLET | Freq: Two times a day (BID) | ORAL | Status: DC

## 2015-01-18 NOTE — Patient Instructions (Signed)
Continue your current therapy  I will see you in 6  Months   

## 2015-01-18 NOTE — Progress Notes (Signed)
Abigail Day Date of Birth: 02-23-1934 Medical Record #[id number]  History of Present Illness: MPatriciaann Day. Abigail BergeronKey is seen for follow up of Afib. She has a history of paroxysmal atrial fib - on Eliquis. EF is normal per echo back in December of 2013. Normal Myoview in June 2015. Other issues include HLD, hypothyroidism with prior ablation for hyperthyroidism, and diverticulosis. She is  on CPAP therapy for sleep apnea-followed by pulmonary.  On follow up today she is feeling very well. She notes increased episodes of Afib over the last 2-3 months. Last up to 15 minutes.  No SOB or CP. No dizziness. She has had trouble finding a CPAP mask that fits and has had more trouble sleeping due to an overactive bladder. Notes Afib occurs when she is overstressed.   Current Outpatient Prescriptions on File Prior to Visit  Medication Sig Dispense Refill  . Biotin 5000 MCG TABS Take 1 tablet by mouth daily.    . Calcium Carbonate-Vitamin D (CALCIUM 600 + D PO) Take 2 tablets by mouth daily.      . Cholecalciferol (VITAMIN D) 1000 UNITS capsule Take 1,000 Units by mouth daily.      Marland Kitchen. co-enzyme Q-10 30 MG capsule Take 30 mg by mouth daily.      Marland Kitchen. CRANBERRY PO Take by mouth.    . ibandronate (BONIVA) 150 MG tablet Take 1 tablet (150 mg total) by mouth every 30 (thirty) days. 3 tablet 3  . lactase (LACTAID) 3000 UNITS tablet Take 3,000 Units by mouth 3 (three) times daily with meals.    Marland Kitchen. levothyroxine (SYNTHROID, LEVOTHROID) 75 MCG tablet Take 1 tablet (75 mcg total) by mouth daily. 90 tablet 3  . loratadine (CLARITIN) 10 MG tablet Take 10 mg by mouth daily.    . metoprolol tartrate (LOPRESSOR) 25 MG tablet Take 1 tablet (25 mg total) by mouth 2 (two) times daily. 180 tablet 2  . Misc Natural Products (OSTEO BI-FLEX JOINT SHIELD PO) Take 2 tablets by mouth daily.    . Multiple Vitamin (MULTI-VITAMIN) tablet Take 1 tablet by mouth daily.      . Multiple Vitamins-Minerals (ICAPS PO) Take 1 tablet by mouth daily.    .  Omega-3 Fatty Acids (FISH OIL PO) Take 1,200 mg by mouth 2 (two) times daily.     . pravastatin (PRAVACHOL) 40 MG tablet Take 2 tablets (80 mg total) by mouth daily. 180 tablet 3   No current facility-administered medications on file prior to visit.    No Known Allergies  Past Medical History  Diagnosis Date  . Pure hypercholesterolemia   . Osteopenia   . Diverticulosis   . Urge urinary incontinence   . Hemorrhoids     internal and external  . Glaucoma suspect   . Unspecified hypothyroidism     s/p ablation for hyperthyroidism  . Hyperplastic colon polyp 09/25/10    Dr. Ewing SchleinMagod  . Hemorrhoids 09/25/10    internal and external  . Palpitations 02/18/2012  . Dyspnea on exertion 02/18/2012  . Paroxysmal atrial fibrillation (HCC)   . Sleep apnea     Past Surgical History  Procedure Laterality Date  . Abdominal hysterectomy  age 81    with BSO and bladder tack  . Eye surgery      bilateral cataract surgery  . Colonoscopy  11/06, 09/25/10    hyperplastic polyp 2012  . Laparoscopic nissen fundoplication  8/07    Dr. Daphine DeutscherMartin  . Esophagogastroduodenoscopy  09/25/10    tortuous esophagus,  intact Nissen fundoplication, dilated the spasm at esophageal GE junction    History  Smoking status  . Never Smoker   Smokeless tobacco  . Never Used    History  Alcohol Use  . Yes    Comment: once a month    Family History  Problem Relation Age of Onset  . Heart disease Mother   . Heart disease Father   . Diabetes Father   . Marfan syndrome Sister   . Heart disease Sister     valve replacement  . Heart disease Brother   . Diabetes Brother   . Heart disease Brother   . Diabetes Brother   . Heart disease Brother   . Heart disease Brother   . Heart disease Brother     CABG  . Heart disease Brother     CABG  . Heart disease Sister     pacemaker  . Cancer Sister 21    breast cancer  . Aortic aneurysm Sister   . Cancer Sister 61    colon cancer  . Heart disease Sister      CABG  . Heart disease Brother   . Heart disease Sister   . Hyperlipidemia Sister   . Stroke Sister     Review of Systems: The review of systems is per the HPI.  All other systems were reviewed and are negative.  Physical Exam: BP 114/68 mmHg  Pulse 56  Ht  (1.499 m)  Wt 56.745 kg (125 lb 1.6 oz)  BMI 25.25 kg/m2 Patient is very pleasant and in no acute distress. Skin is warm and dry. Color is normal.  HEENT is unremarkable. Normocephalic/atraumatic. PERRL. Sclera are nonicteric. Neck is supple. No masses. No JVD. Lungs are clear. Cardiac exam shows a regular rate and rhythm. No gallop or murmur. Abdomen is soft. Extremities are without edema. Gait and ROM are intact. No gross neurologic deficits noted.  LABORATORY DATA:     Lab Results  Component Value Date   WBC 3.2* 12/21/2014   HGB 13.4 12/21/2014   HCT 39.7 12/21/2014   PLT 221 12/21/2014   GLUCOSE 95 12/21/2014   CHOL 175 12/21/2014   TRIG 163* 12/21/2014   HDL 49 12/21/2014   LDLCALC 93 12/21/2014   ALT 18 12/21/2014   AST 22 12/21/2014   NA 137 12/21/2014   K 4.4 12/21/2014   CL 102 12/21/2014   CREATININE 0.70 12/21/2014   BUN 16 12/21/2014   CO2 26 12/21/2014   TSH 1.082 12/21/2014   HGBA1C 5.6 12/21/2014   Ecg today shows NSR with normal Ecg. I have personally reviewed and interpreted this study.   Assessment / Plan:  1. Obstructive sleep apnea. On CPAP therapy  2. PAF - on beta blocker and Eliquis - she is in sinus by exam today. Some increased symptoms recently possibly related to sleep issues. We discussed the possibility of adding an antiarrhythmic drug but we would both like to avoid that for now.  I'll followup again in 6 months.

## 2015-02-05 DIAGNOSIS — M6281 Muscle weakness (generalized): Secondary | ICD-10-CM | POA: Diagnosis not present

## 2015-02-05 DIAGNOSIS — R32 Unspecified urinary incontinence: Secondary | ICD-10-CM | POA: Diagnosis not present

## 2015-02-07 DIAGNOSIS — M6281 Muscle weakness (generalized): Secondary | ICD-10-CM | POA: Diagnosis not present

## 2015-02-07 DIAGNOSIS — R32 Unspecified urinary incontinence: Secondary | ICD-10-CM | POA: Diagnosis not present

## 2015-02-08 DIAGNOSIS — R32 Unspecified urinary incontinence: Secondary | ICD-10-CM | POA: Diagnosis not present

## 2015-02-08 DIAGNOSIS — M6281 Muscle weakness (generalized): Secondary | ICD-10-CM | POA: Diagnosis not present

## 2015-02-12 DIAGNOSIS — M6281 Muscle weakness (generalized): Secondary | ICD-10-CM | POA: Diagnosis not present

## 2015-02-12 DIAGNOSIS — R32 Unspecified urinary incontinence: Secondary | ICD-10-CM | POA: Diagnosis not present

## 2015-02-14 DIAGNOSIS — R32 Unspecified urinary incontinence: Secondary | ICD-10-CM | POA: Diagnosis not present

## 2015-02-14 DIAGNOSIS — M6281 Muscle weakness (generalized): Secondary | ICD-10-CM | POA: Diagnosis not present

## 2015-02-19 DIAGNOSIS — M6281 Muscle weakness (generalized): Secondary | ICD-10-CM | POA: Diagnosis not present

## 2015-02-19 DIAGNOSIS — R32 Unspecified urinary incontinence: Secondary | ICD-10-CM | POA: Diagnosis not present

## 2015-02-21 DIAGNOSIS — M6281 Muscle weakness (generalized): Secondary | ICD-10-CM | POA: Diagnosis not present

## 2015-02-21 DIAGNOSIS — R32 Unspecified urinary incontinence: Secondary | ICD-10-CM | POA: Diagnosis not present

## 2015-02-22 ENCOUNTER — Ambulatory Visit
Admission: RE | Admit: 2015-02-22 | Discharge: 2015-02-22 | Disposition: A | Discharge status: Home / Self Care | Payer: Medicare Other | Source: Ambulatory Visit

## 2015-02-22 DIAGNOSIS — Z1231 Encounter for screening mammogram for malignant neoplasm of breast: Secondary | ICD-10-CM

## 2015-03-01 DIAGNOSIS — R32 Unspecified urinary incontinence: Secondary | ICD-10-CM | POA: Diagnosis not present

## 2015-03-01 DIAGNOSIS — M6281 Muscle weakness (generalized): Secondary | ICD-10-CM | POA: Diagnosis not present

## 2015-03-05 DIAGNOSIS — R32 Unspecified urinary incontinence: Secondary | ICD-10-CM | POA: Diagnosis not present

## 2015-03-05 DIAGNOSIS — M6281 Muscle weakness (generalized): Secondary | ICD-10-CM | POA: Diagnosis not present

## 2015-03-08 ENCOUNTER — Encounter: Payer: Self-pay | Admitting: Family Medicine

## 2015-03-08 ENCOUNTER — Ambulatory Visit (INDEPENDENT_AMBULATORY_CARE_PROVIDER_SITE_OTHER): Payer: Medicare Other | Admitting: Family Medicine

## 2015-03-08 VITALS — BP 118/80 | HR 64 | Temp 98.7°F | Ht <= 58 in | Wt 122.8 lb

## 2015-03-08 DIAGNOSIS — J069 Acute upper respiratory infection, unspecified: Secondary | ICD-10-CM | POA: Diagnosis not present

## 2015-03-08 DIAGNOSIS — J3489 Other specified disorders of nose and nasal sinuses: Secondary | ICD-10-CM | POA: Diagnosis not present

## 2015-03-08 MED ORDER — AMOXICILLIN 500 MG PO TABS: 1000.0000 mg | ORAL_TABLET | Freq: Two times a day (BID) | ORAL | Status: DC

## 2015-03-08 NOTE — Patient Instructions (Signed)
  Use nasal saline spray frequently to keep the mucus membranes of the nose moist. Continue to use the ointment at the crusty area on the left nostril. Restart using sinus rinses/Neti-pot.  This should help lessen the pressure in the sinuses in your left cheek.  If after 1-2 days of sinus rinses and continued robitussin or mucinex, you continue to have sinus pain and discolored mucus, then start the antibiotic. Also start it immediately if you develop any fever.  Since you are feeling a little better now, this might just be a virus, and continue to improve on its own. But if the sinus isn't draining properly, it might get a bacterial infection.  Try and keep the sinus flushed to avoid an infection.  If mucus remains discolored, you develop fever, or if you continue to have pain in the left cheek, then take the full course of antibiotics.

## 2015-03-08 NOTE — Progress Notes (Signed)
Chief Complaint  Patient presents with  . Facial Pain    and pressure in the sinus area (left worse than right) sine last Friday. This morning she coughed up blood and blew it out of her nose too (she is concerned bc she is blood thinners). Does not feel like she has had any fever. Extremely tired, legs feel weak. And she is coughing some. Slight headache.    She started with chest tightness and laryngitis 6 days ago. 4 days ago she still had laryngitis, but started coughing a little.  Sinus pressure started 2 days ago in the left cheek, and headaches (frontal, over both eyes).  Today she noticed blood in her mucus, when blowing her nose and coughing it up.  She is on Eliquis. She got scared when she saw the blood. Mucus/phlegm is thick and yellow-green--seeing this discolored phlegm since she started with the laryngitis, coughing up the discolored phlegm. Denies shortness of breath, chest pain. +sick contact--husband had similar illness. She feels a little better overall--energy is better, feels stronger.  PMH, PSH, SH reviewed  Outpatient Encounter Prescriptions as of 03/08/2015  Medication Sig Note  . apixaban (ELIQUIS) 5 MG TABS tablet Take 1 tablet (5 mg total) by mouth 2 (two) times daily.   . Biotin 5000 MCG TABS Take 1 tablet by mouth daily.   . Calcium Carbonate-Vitamin D (CALCIUM 600 + D PO) Take 2 tablets by mouth daily.     . Cholecalciferol (VITAMIN D) 1000 UNITS capsule Take 1,000 Units by mouth daily.     Marland Kitchen co-enzyme Q-10 30 MG capsule Take 30 mg by mouth daily.     Marland Kitchen CRANBERRY PO Take by mouth.   Marland Kitchen guaiFENesin-dextromethorphan (ROBITUSSIN DM) 100-10 MG/5ML syrup Take 5 mLs by mouth every 4 (four) hours as needed for cough.   . ibandronate (BONIVA) 150 MG tablet Take 1 tablet (150 mg total) by mouth every 30 (thirty) days.   Marland Kitchen lactase (LACTAID) 3000 UNITS tablet Take 3,000 Units by mouth 3 (three) times daily with meals. 03/27/2014: Takes 1 prior to breakfast and lunch, 2 prior to  dinner  . levothyroxine (SYNTHROID, LEVOTHROID) 75 MCG tablet Take 1 tablet (75 mcg total) by mouth daily.   Marland Kitchen loratadine (CLARITIN) 10 MG tablet Take 10 mg by mouth daily.   . metoprolol tartrate (LOPRESSOR) 25 MG tablet Take 1 tablet (25 mg total) by mouth 2 (two) times daily.   . Misc Natural Products (OSTEO BI-FLEX JOINT SHIELD PO) Take 2 tablets by mouth daily.   . Multiple Vitamin (MULTI-VITAMIN) tablet Take 1 tablet by mouth daily.     . Multiple Vitamins-Minerals (ICAPS PO) Take 1 tablet by mouth daily.   . Omega-3 Fatty Acids (FISH OIL PO) Take 1,200 mg by mouth 2 (two) times daily.  06/29/2014: Takes 3 daily  . pravastatin (PRAVACHOL) 40 MG tablet Take 2 tablets (80 mg total) by mouth daily.   . Zinc Gluconate (COLD-EEZE PO) Take 1 each by mouth as needed.    No facility-administered encounter medications on file as of 03/08/2015.   No Known Allergies  ROS: No fever, chills, nausea, vomiting, diarrhea. Denies rashes.  Only bleeding has been as stated above, in mucus. No other bleeding noted. See HPI  PHYSICAL EXAM: BP 118/80 mmHg  Pulse 64  Temp(Src) 98.7 F (37.1 C) (Tympanic)  Ht 4\' 10"  (1.473 m)  Wt 122 lb 12.8 oz (55.702 kg)  BMI 25.67 kg/m2  Well appearing female in no distress.  Occasional deep  cough and sniffles HEENT: PERRL, EOMi, conjunctiva and sclera are clear.  TM's and EAC's are normal.  Nasal mucosa is moderately edematous, pale, no erythema, no purulence.  There is some crusting/scabbing in the distal left nares, no active bleeding.  Mildly tender over left maxillary sinus. OP is clear without erythema Neck: no lymphadenopathy or mass Heart: regular rate and rhythm Lungs: clear bilaterally; no wheezes, rales, ronchi Skin: normal turgor, no rash Neuro: alert and oriented   ASSESSMENT/PLAN:  Acute upper respiratory infection - Plan: amoxicillin (AMOXIL) 500 MG tablet  Sinus pain  URI, cannot r/o early sinus infection.  Some bleeding related to  anticoagulation, but no significant blood loss or ongoing bleeding.  Pt reassured.  S/sx of bacterial infection reviewed.  Use nasal saline spray frequently to keep the mucus membranes of the nose moist. Continue to use the ointment at the crusty area on the left nostril. Restart using sinus rinses/Neti-pot.  This should help lessen the pressure in the sinuses in your left cheek.  If after 1-2 days of sinus rinses and continued robitussin or mucinex, you continue to have sinus pain and discolored mucus, then start the antibiotic. Since you are feeling a little better now, this might just be a virus, and continue to improve on its own. But if the sinus isn't draining properly, it might get a bacterial infection.  Try and keep the sinus flushed to avoid an infection.  If mucus remains discolored and you continue to have pain in the left cheek, then take the full course of antibiotics.

## 2015-03-29 ENCOUNTER — Encounter: Payer: Self-pay | Admitting: Family Medicine

## 2015-03-29 ENCOUNTER — Ambulatory Visit (INDEPENDENT_AMBULATORY_CARE_PROVIDER_SITE_OTHER): Payer: Medicare Other | Admitting: Family Medicine

## 2015-03-29 VITALS — BP 122/72 | HR 56 | Ht 58.25 in | Wt 125.6 lb

## 2015-03-29 DIAGNOSIS — Z01419 Encounter for gynecological examination (general) (routine) without abnormal findings: Secondary | ICD-10-CM

## 2015-03-29 DIAGNOSIS — R7301 Impaired fasting glucose: Secondary | ICD-10-CM

## 2015-03-29 DIAGNOSIS — Z0000 Encounter for general adult medical examination without abnormal findings: Secondary | ICD-10-CM | POA: Diagnosis not present

## 2015-03-29 DIAGNOSIS — E782 Mixed hyperlipidemia: Secondary | ICD-10-CM

## 2015-03-29 DIAGNOSIS — I480 Paroxysmal atrial fibrillation: Secondary | ICD-10-CM

## 2015-03-29 DIAGNOSIS — N3941 Urge incontinence: Secondary | ICD-10-CM

## 2015-03-29 DIAGNOSIS — G4733 Obstructive sleep apnea (adult) (pediatric): Secondary | ICD-10-CM

## 2015-03-29 DIAGNOSIS — E039 Hypothyroidism, unspecified: Secondary | ICD-10-CM

## 2015-03-29 DIAGNOSIS — Z5181 Encounter for therapeutic drug level monitoring: Secondary | ICD-10-CM

## 2015-03-29 DIAGNOSIS — M8580 Other specified disorders of bone density and structure, unspecified site: Secondary | ICD-10-CM

## 2015-03-29 LAB — CBC WITH DIFFERENTIAL/PLATELET
BASOS PCT: 1 % (ref 0–1)
Basophils Absolute: 0 10*3/uL (ref 0.0–0.1)
EOS ABS: 0.2 10*3/uL (ref 0.0–0.7)
Eosinophils Relative: 5 % (ref 0–5)
HCT: 39.5 % (ref 36.0–46.0)
Hemoglobin: 13.3 g/dL (ref 12.0–15.0)
Lymphocytes Relative: 32 % (ref 12–46)
Lymphs Abs: 1.1 10*3/uL (ref 0.7–4.0)
MCH: 33.3 pg (ref 26.0–34.0)
MCHC: 33.7 g/dL (ref 30.0–36.0)
MCV: 98.8 fL (ref 78.0–100.0)
MONOS PCT: 12 % (ref 3–12)
MPV: 9.7 fL (ref 8.6–12.4)
Monocytes Absolute: 0.4 10*3/uL (ref 0.1–1.0)
NEUTROS PCT: 50 % (ref 43–77)
Neutro Abs: 1.7 10*3/uL (ref 1.7–7.7)
PLATELETS: 249 10*3/uL (ref 150–400)
RBC: 4 MIL/uL (ref 3.87–5.11)
RDW: 12.8 % (ref 11.5–15.5)
WBC: 3.4 10*3/uL — ABNORMAL LOW (ref 4.0–10.5)

## 2015-03-29 LAB — POCT GLYCOSYLATED HEMOGLOBIN (HGB A1C): HEMOGLOBIN A1C: 5.4

## 2015-03-29 NOTE — Progress Notes (Signed)
Chief Complaint  Patient presents with  . Medicare Wellness    nonfasting med check/med check plus with pelvic exam. Still has a little bit of a lingering cold and she is still doing netti pot and saline. She does feel better though.    Abigail Day is a 82 y.o. female who presents for annual wellness visit and follow-up on chronic medical conditions.  She has the following concerns:  Seen earlier this month with URI vs early sinus infection.  She was prescribed amoxicillin to use if not improving, and she did require this.  She is much improved.  Mucus after Neti-pot is now clear.  Hypothyroidism: Denies changes in energy, skin, nails, moods. Bowels--unchanged; some loose stools after yogurt; overall stools are better since using Lactaid regularly.  She feels like her hair sometimes doesn't have any "body" to it, but doesn't feel like she is losing any significant amount, no thinning.  Mixed hyperlipidemia: She continues to take 3 fish oil daily, along with the pravastatin. Compliant with medications and denies medication side effects. She has some "crawling" discomfort in her legs, mainly when they are cold.She has been having some aching in her legs recently, feels better when she uses the electric throw/blanket.   Atrial fibrillation: She remains on blood thinners without any bleeding or complications. She denies tachycardia--just a small spell every few months, feels it more at night. She denies chest pain, shortness of breath.  Sometimes slightly winded after walking up 3 flights of stairs. Sees Dr. [name] every 6 months.  HTN: Occasionally checks; yesterday it was 112/60. No headaches, dizziness, chest pain.  OSA: She is using nasal CPAP, and doing well. She was previously followed by Dr. Shelle Iron, and is scheduled to see Dr. Craige Cotta next month.  Doing well. She feels more refreshed since being on CPAP. Had trouble using it all night long with her recent cold, but used it at least 4-5  hours.  Osteopenia--She restarted Boniva after her last bone density 04/2014. She had stopped it after reading about risks in the paper (on her own) prior to her last visit). She hasn't had any side effects. She switched to taking it in the evening (but making sure to stay upright for an hour before bed) due to timing with her thyroid medication. Denies any reflux, chest pain or other side effects. She had DEXA in 04/2014: T-1.9 at L femur, -1.6 at L forearm.  Allergies--she has been taking a daily antihistamine which has improved her postnasal drainage and allergy symptoms (apart from her recent cold).  She gets some hoarseness in the afternoons.  Urinary frequency. She sometimes still gets up 2-3x/night, not as bad as it has been in the past. Sometimes she has trouble holding the urine (urge incontinence).  We previously discussed oxybutynin, but declined trial. Doing some Kegel's which has helped. It is annoying mainly related to having to take CPAP on/off each time.    Immunization History  Administered Date(s) Administered  . Influenza Split 01/08/2011, 01/08/2012  . Influenza, High Dose Seasonal PF 12/08/2012, 12/19/2013, 12/21/2014  . Pneumococcal Conjugate-13 03/27/2014  . Pneumococcal Polysaccharide-23 01/31/2002, 01/08/2012  . Td 10/01/2004  . Tdap 01/08/2012  . Zoster 06/01/2008   Last Pap smear: n/a  Last mammogram: 02/2015 Last colonoscopy: 09/2010  Last DEXA: 04/2014--T-1.9, with abnormal FRAX (21%/ 5.1% at hip). She started Boniva after this test  Ophtho: yearly Dentist: every 9 months  Exercise: 30 minute exercise class 3x/week; Goes to weight room (bicycle and elliptical,  and some weights) x 45 minutes every day, and walks 30 minutes every day.   Other doctors caring for patient include: Cardiology: Dr. [name]  Dermatologist: Dr. Terri Piedra  Ophtho: Dr. Dione Booze  Dentist: Dr. Providence Lanius  Pulmonary (for OSA): Dr. Craige Cotta (sched in Feb, previously Dr. Shelle Iron)  GI: Dr.  Ewing Schlein   Depression screen: negative Fall Screen: negative Functional Status Screen: notable only for urinary incontinence. See full screen in epic.  End of Life Discussion:  Patient has a living will and medical power of attorney  Past Medical History  Diagnosis Date  . Pure hypercholesterolemia   . Osteopenia   . Diverticulosis   . Urge urinary incontinence   . Hemorrhoids     internal and external  . Glaucoma suspect   . Unspecified hypothyroidism     s/p ablation for hyperthyroidism  . Hyperplastic colon polyp 09/25/10    Dr. Ewing Schlein  . Hemorrhoids 09/25/10    internal and external  . Palpitations 02/18/2012  . Dyspnea on exertion 02/18/2012  . Paroxysmal atrial fibrillation (HCC)   . Sleep apnea     Past Surgical History  Procedure Laterality Date  . Abdominal hysterectomy  age 68    with BSO and bladder tack  . Eye surgery      bilateral cataract surgery  . Colonoscopy  11/06, 09/25/10    hyperplastic polyp 2012  . Laparoscopic nissen fundoplication  8/07    Dr. Daphine Deutscher  . Esophagogastroduodenoscopy  09/25/10    tortuous esophagus, intact Nissen fundoplication, dilated the spasm at esophageal GE junction    Social History   Social History  . Marital Status: Married    Spouse Name: N/A  . Number of Children: 2  . Years of Education: N/A   Occupational History  . retired    Social History Main Topics  . Smoking status: Never Smoker   . Smokeless tobacco: Never Used  . Alcohol Use: 0.0 oz/week    0 Standard drinks or equivalent per week     Comment: once a month or less  . Drug Use: No  . Sexual Activity: Not Currently   Other Topics Concern  . Not on file   Social History Narrative   Lives with husband. 2 sons--one in [place], one in [place].  3 grandchildren. Moved to [facility] in 10/2012    Family History  Problem Relation Age of Onset  . Heart disease Mother   . Heart disease Father   . Diabetes Father   . Marfan syndrome Sister    . Heart disease Sister     valve replacement  . Heart disease Brother   . Diabetes Brother   . Heart disease Brother   . Diabetes Brother   . Heart disease Brother   . Heart disease Brother   . Heart disease Brother     CABG  . Heart disease Brother     CABG  . Heart disease Sister     pacemaker  . Cancer Sister 72    breast cancer  . Aortic aneurysm Sister   . Cancer Sister 56    colon cancer  . Heart disease Sister     CABG  . Heart disease Brother   . Heart disease Sister   . Hyperlipidemia Sister   . Stroke Sister     Outpatient Encounter Prescriptions as of 03/29/2015  Medication Sig Note  . apixaban (ELIQUIS) 5 MG TABS tablet Take 1 tablet (5 mg total) by mouth 2 (two)  times daily.   . Biotin 5000 MCG TABS Take 1 tablet by mouth daily.   . Calcium Carbonate-Vitamin D (CALCIUM 600 + D PO) Take 2 tablets by mouth daily.     . Cholecalciferol (VITAMIN D) 1000 UNITS capsule Take 1,000 Units by mouth daily.     Marland Kitchen co-enzyme Q-10 30 MG capsule Take 30 mg by mouth daily.     Marland Kitchen CRANBERRY PO Take by mouth.   . ibandronate (BONIVA) 150 MG tablet Take 1 tablet (150 mg total) by mouth every 30 (thirty) days.   Marland Kitchen lactase (LACTAID) 3000 UNITS tablet Take 3,000 Units by mouth 3 (three) times daily with meals. 03/27/2014: Takes 1 prior to breakfast and lunch, 2 prior to dinner  . levothyroxine (SYNTHROID, LEVOTHROID) 75 MCG tablet Take 1 tablet (75 mcg total) by mouth daily.   Marland Kitchen loratadine (CLARITIN) 10 MG tablet Take 10 mg by mouth daily.   . metoprolol tartrate (LOPRESSOR) 25 MG tablet Take 1 tablet (25 mg total) by mouth 2 (two) times daily.   . Misc Natural Products (OSTEO BI-FLEX JOINT SHIELD PO) Take 2 tablets by mouth daily.   . Multiple Vitamin (MULTI-VITAMIN) tablet Take 1 tablet by mouth daily.     . Multiple Vitamins-Minerals (ICAPS PO) Take 1 tablet by mouth daily.   . Omega-3 Fatty Acids (FISH OIL PO) Take 1,200 mg by mouth 2 (two) times daily.  06/29/2014: Takes 3 daily   . pravastatin (PRAVACHOL) 40 MG tablet Take 2 tablets (80 mg total) by mouth daily.   . [DISCONTINUED] amoxicillin (AMOXIL) 500 MG tablet Take 2 tablets (1,000 mg total) by mouth 2 (two) times daily.   . [DISCONTINUED] guaiFENesin-dextromethorphan (ROBITUSSIN DM) 100-10 MG/5ML syrup Take 5 mLs by mouth every 4 (four) hours as needed for cough. Reported on 03/29/2015   . [DISCONTINUED] Zinc Gluconate (COLD-EEZE PO) Take 1 each by mouth as needed.    No facility-administered encounter medications on file as of 03/29/2015.    No Known Allergies  ROS: The patient denies anorexia, fever, headaches, vision changes, decreased hearing, ear pain, sore throat, breast concerns, chest pain, syncope, dyspnea on exertion (has some with stairs); denies cough, swelling, nausea, vomiting, constipation, abdominal pain, melena, hematochezia, indigestion/heartburn, dysphagia, dysuria, hematuria, vaginal bleeding, discharge, odor or itch, genital lesions, numbness (just bottom of feet at her toes, chronically), tingling, weakness, tremor, suspicious skin lesions, depression, anxiety, abnormal bleeding/bruising, or enlarged lymph nodes.  Remembering names has improved (felt overwhelmed when they first moved, too many new people). Occasional pain under her right shoulder blade, unchanged. +urge incontinence, per HPI, usually in the middle of the night). Intermittent rash under her breast, comes and goes (improves by keeping it dry, and using antifungals prn.) She has another rash on the chest wall, for which she had seen Dr. Terri Piedra. Rash recurred (hadn't had in a year). She has been using a cortisone cream, and it is starting to improve. Sinus symptoms are improving.   PHYSICAL EXAM:  BP 122/72 mmHg  Pulse 56  Ht 4' 10.25" (1.48 m)  Wt 125 lb 9.6 oz (56.972 kg)  BMI 26.01 kg/m2  General Appearance:  Alert, cooperative, no distress, appears stated age   Head:  Normocephalic, without obvious abnormality,  atraumatic   Eyes:  PERRL, conjunctiva/corneas clear, EOM's intact, fundi  benign   Ears:  Normal TM's and external ear canals   Nose:  Nares normal, mucosa normal, no drainage or sinus tenderness. Some mild crusting in both nares, no erythema or  purulence  Throat:  Lips, mucosa, and tongue normal; teeth and gums normal. Upper dentures and partials on lower.   Neck:  Supple, no lymphadenopathy; thyroid: no enlargement/tenderness/nodules; no carotid  bruit or JVD   Back:  Spine nontender; ROM normal. No CVA tenderness. +kyphosis and scoliosis.   Lungs:  Clear to auscultation bilaterally without wheezes, rales or ronchi; respirations unlabored   Chest Wall:  No tenderness or deformity   Heart:  Regular rate and rhythm, S1 and S2 normal, no murmur, rub  or gallop   Breast Exam:  No tenderness, masses, or nipple discharge or inversion. No axillary lymphadenopathy   Abdomen:  Soft, nondistended, normoactive bowel sounds, nontender; no masses, no hepatosplenomegaly.  Genitalia:  Normal external genitalia without lesions. BUS and vagina normal, atrophic changes noted; No abnormal vaginal discharge. Uterus and adnexa surgically absent, no masses. Pap not performed   Rectal:  Normal tone, no masses or tenderness; guaiac negative stool   Extremities:  No clubbing, cyanosis or edema. No lesions or blisters noted. 2+ pulses. Area of numbness is at the metatarsal heads on both feet. There is no swelling, nontender.  Pulses:  2+ and symmetric all extremities   Skin:  Skin color, texture, turgor normal, no lesions.Erythema at waist bilaterally. Only minimally raised, diffuse rash at sides only  Lymph nodes:  Cervical, supraclavicular, and axillary nodes normal   Neurologic:  CNII-XII intact, normal strength, sensation and gait; reflexes 2+ and symmetric throughout. Alert and oriented.Normal recall of all 3 items, spelled WORLD  backwards very quickly.   Psych:    Normal mood, affect, hygiene and grooming       Lab Results  Component Value Date   HGBA1C 5.4 03/29/2015   Lab Results  Component Value Date   TSH 1.082 12/21/2014   Lab Results  Component Value Date   WBC 3.2* 12/21/2014   HGB 13.4 12/21/2014   HCT 39.7 12/21/2014   MCV 99.3 12/21/2014   PLT 221 12/21/2014     Chemistry      Component Value Date/Time   NA 137 12/21/2014 0001   K 4.4 12/21/2014 0001   CL 102 12/21/2014 0001   CO2 26 12/21/2014 0001   BUN 16 12/21/2014 0001   CREATININE 0.70 12/21/2014 0001   CREATININE 0.8 07/23/2012 1028      Component Value Date/Time   CALCIUM 9.4 12/21/2014 0001   ALKPHOS 47 12/21/2014 0001   AST 22 12/21/2014 0001   ALT 18 12/21/2014 0001   BILITOT 0.5 12/21/2014 0001     Fasting glucose 95 on October labs (2016).  Lab Results  Component Value Date   CHOL 175 12/21/2014   HDL 49 12/21/2014   LDLCALC 93 12/21/2014   TRIG 163* 12/21/2014   CHOLHDL 3.6 12/21/2014     ASSESSMENT/PLAN:   Medicare annual wellness visit, subsequent  IFG (impaired fasting glucose) - normal recent glucose and A1c. Continue healthy diet, daily exercise. - Plan: HgB A1c  Mixed hyperlipidemia - at goal  Hypothyroidism, unspecified hypothyroidism type - controlled on current dose  Paroxysmal atrial fibrillation (HCC) - stable  Obstructive sleep apnea - on CPAP with upcoming appt with Dr. Craige Cotta  Osteopenia - continue Boniva. Recheck DEXA next year.  Urge incontinence of urine - continue behavioral measures; declines meds  Medication monitoring encounter - Plan: CBC with Differential/Platelet  Rash--?etiology (contact derm?). Continue OTC hydrocortisone BID-TID.  F/u with derm if not improving.  CBC repeat today due to WBC being low last check.  Discussed monthly self breast exams and yearly mammograms; at least 30 minutes of aerobic activity at least 5 days/week,  weight-bearing exercise at least 2x/week; proper sunscreen use reviewed; healthy diet, including goals of calcium and vitamin D intake and alcohol recommendations (less than or equal to 1 drink/day) reviewed; regular seatbelt use; changing batteries in smoke detectors. Immunization recommendations discussed--UTD. Colonoscopy recommendations reviewed, UTD. Should be due 09/2015 (5 yrs since last, due to family history) DEXA due 04/2016   MOST form reviewed and updated, full care.   F/u 6 months, med check (rather than in April, to get back on track).   Medicare Attestation I have personally reviewed: The patient's medical and social history Their use of alcohol, tobacco or illicit drugs Their current medications and supplements The patient's functional ability including ADLs,fall risks, home safety risks, cognitive, and hearing and visual impairment Diet and physical activities Evidence for depression or mood disorders  The patient's weight, height, and BMI have been recorded in the chart.  I have made referrals, counseling, and provided education to the patient based on review of the above and I have provided the patient with a written personalized care plan for preventive services.     Zakara Parkey A, MD   03/29/2015

## 2015-03-29 NOTE — Patient Instructions (Signed)
  HEALTH MAINTENANCE RECOMMENDATIONS:  It is recommended that you get at least 30 minutes of aerobic exercise at least 5 days/week (for weight loss, you may need as much as 60-90 minutes). This can be any activity that gets your heart rate up. This can be divided in 10-15 minute intervals if needed, but try and build up your endurance at least once a week.  Weight bearing exercise is also recommended twice weekly.  Eat a healthy diet with lots of vegetables, fruits and fiber.  "Colorful" foods have a lot of vitamins (ie green vegetables, tomatoes, red peppers, etc).  Limit sweet tea, regular sodas and alcoholic beverages, all of which has a lot of calories and sugar.  Up to 1 alcoholic drink daily may be beneficial for women (unless trying to lose weight, watch sugars).  Drink a lot of water.  Calcium recommendations are 1200-1500 mg daily (1500 mg for postmenopausal women or women without ovaries), and vitamin D 1000 IU daily.  This should be obtained from diet and/or supplements (vitamins), and calcium should not be taken all at once, but in divided doses.  Monthly self breast exams and yearly mammograms for women over the age of 52 is recommended.  Sunscreen of at least SPF 30 should be used on all sun-exposed parts of the skin when outside between the hours of 10 am and 4 pm (not just when at beach or pool, but even with exercise, golf, tennis, and yard work!)  Use a sunscreen that says "broad spectrum" so it covers both UVA and UVB rays, and make sure to reapply every 1-2 hours.  Remember to change the batteries in your smoke detectors when changing your clock times in the spring and fall.  Use your seat belt every time you are in a car, and please drive safely and not be distracted with cell phones and texting while driving.    Abigail Day , Thank you for taking time to come for your Medicare Wellness Visit. I appreciate your ongoing commitment to your health goals. Please review the following  plan we discussed and let me know if I can assist you in the future.   These are the goals we discussed: Goals    None      This is a list of the screening recommended for you and due dates:  Health Maintenance  Topic Date Due  . Flu Shot  10/02/2015  . Tetanus Vaccine  01/07/2022  . DEXA scan (bone density measurement)  Completed  . Shingles Vaccine  Completed  . Pneumonia vaccines  Completed   Continue yearly mammograms, next due 02/2016. Your next colonoscopy should be due 09/2015.  Check with Dr. Marlane Hatcher office if you don't hear from him.  I would probably recommend colonoscopy this year, and if no polyps, or very small, to consider using Cologard for screening when older.  Your next bone density test is due 04/2016.

## 2015-04-11 ENCOUNTER — Other Ambulatory Visit: Payer: Self-pay | Admitting: *Deleted

## 2015-04-11 ENCOUNTER — Other Ambulatory Visit: Payer: Self-pay | Admitting: Family Medicine

## 2015-04-25 ENCOUNTER — Other Ambulatory Visit: Payer: Self-pay | Admitting: Cardiology

## 2015-04-25 ENCOUNTER — Ambulatory Visit (INDEPENDENT_AMBULATORY_CARE_PROVIDER_SITE_OTHER): Payer: Medicare Other | Admitting: Pulmonary Disease

## 2015-04-25 ENCOUNTER — Ambulatory Visit: Payer: Medicare Other | Admitting: Pulmonary Disease

## 2015-04-25 ENCOUNTER — Encounter: Payer: Self-pay | Admitting: Pulmonary Disease

## 2015-04-25 VITALS — BP 104/58 | HR 50 | Ht <= 58 in | Wt 127.0 lb

## 2015-04-25 DIAGNOSIS — Z9989 Dependence on other enabling machines and devices: Principal | ICD-10-CM

## 2015-04-25 DIAGNOSIS — G4733 Obstructive sleep apnea (adult) (pediatric): Secondary | ICD-10-CM

## 2015-04-25 NOTE — Patient Instructions (Signed)
Will have CPAP changed to auto setting with range of 5 to 15 cm H2O  Follow up in 6 months

## 2015-04-25 NOTE — Progress Notes (Signed)
Current Outpatient Prescriptions on File Prior to Visit  Medication Sig  . apixaban (ELIQUIS) 5 MG TABS tablet Take 1 tablet (5 mg total) by mouth 2 (two) times daily.  . Biotin 5000 MCG TABS Take 1 tablet by mouth daily.  . Calcium Carbonate-Vitamin D (CALCIUM 600 + D PO) Take 2 tablets by mouth daily.    . Cholecalciferol (VITAMIN D) 1000 UNITS capsule Take 1,000 Units by mouth daily.    Marland Kitchen co-enzyme Q-10 30 MG capsule Take 30 mg by mouth daily.    Marland Kitchen CRANBERRY PO Take by mouth.  . ibandronate (BONIVA) 150 MG tablet Take 1 tablet (150 mg total) by mouth every 30 (thirty) days.  Marland Kitchen lactase (LACTAID) 3000 UNITS tablet Take 3,000 Units by mouth 3 (three) times daily with meals.  Marland Kitchen levothyroxine (SYNTHROID, LEVOTHROID) 75 MCG tablet Take 1 tablet (75 mcg total) by mouth daily.  Marland Kitchen loratadine (CLARITIN) 10 MG tablet Take 10 mg by mouth daily.  . Misc Natural Products (OSTEO BI-FLEX JOINT SHIELD PO) Take 2 tablets by mouth daily.  . Multiple Vitamin (MULTI-VITAMIN) tablet Take 1 tablet by mouth daily.    . Multiple Vitamins-Minerals (ICAPS PO) Take 1 tablet by mouth daily.  . Omega-3 Fatty Acids (FISH OIL PO) Take 1,200 mg by mouth 2 (two) times daily.   . pravastatin (PRAVACHOL) 40 MG tablet Take 2 tablets (80 mg total) by mouth daily.   No current facility-administered medications on file prior to visit.     Chief Complaint  Patient presents with  . Follow-up    Former [facility] pt: Wears CPAP nighlty. Pressure 6. Denies problems with mask. DME [facility]     Tests PSG 08/18/12 >> AHI 23, SpO2 low 81% CPAP 01/25/15 to 04/24/15 >> used on 87 of 90 nights with average 4 hrs and 33 min.  Average AHI is 6.7 with CPAP 14 cm H2O.   Past medical hx PAF, HLD, Osteopenia, Diverticulosis, Hypothyroidism  Past surgical hx, Allergies, Family hx, Social hx all reviewed.  Vital Signs BP 104/58 mmHg  Pulse 50  Ht  (1.422 m)  Wt 127 lb (57.607 kg)  BMI 28.49 kg/m2  SpO2 95%  History of Present  Illness Abigail Day is a 81 y.o. female with OSA.  She was using nasal pillows, but switched to nasal cup.  She is getting dryness in her sinuses.  She uses CPAP for about 5 hours >> wakes up to use bathroom, but then doesn't always put CPAP back on.  Physical Exam  General - No distress ENT - No sinus tenderness, no oral exudate, no LAN Cardiac - s1s2 regular, no murmur Chest - No wheeze/rales/dullness Back - No focal tenderness Abd - Soft, non-tender Ext - No edema Neuro - Normal strength Skin - No rashes Psych - normal mood, and behavior   Assessment/Plan  Obstructive sleep apnea. Plan: - will change her to auto CPAP setting - discussed how to adjust her humidifier on CPAP - she will decide whether she wants to get a humidifier for her bedroom >> advised that this would be her decision, and I didn't have a recommendation for or against this - advised her to use CPAP for entire time she is asleep   Patient Instructions  Will have CPAP changed to auto setting with range of 5 to 15 cm H2O  Follow up in 6 months     Coralyn Helling, MD  Pulmonary/Critical Care/Sleep Pager:  [phone]

## 2015-04-25 NOTE — Telephone Encounter (Signed)
Rx(s) sent to pharmacy electronically.  

## 2015-05-08 DIAGNOSIS — H579 Unspecified disorder of eye and adnexa: Secondary | ICD-10-CM

## 2015-05-08 DIAGNOSIS — Z961 Presence of intraocular lens: Secondary | ICD-10-CM | POA: Diagnosis not present

## 2015-05-08 DIAGNOSIS — H40013 Open angle with borderline findings, low risk, bilateral: Secondary | ICD-10-CM | POA: Diagnosis not present

## 2015-05-08 DIAGNOSIS — H353131 Nonexudative age-related macular degeneration, bilateral, early dry stage: Secondary | ICD-10-CM | POA: Diagnosis not present

## 2015-05-08 HISTORY — DX: Unspecified disorder of eye and adnexa: H57.9

## 2015-05-11 ENCOUNTER — Encounter: Payer: Self-pay | Admitting: *Deleted

## 2015-05-14 ENCOUNTER — Encounter: Payer: Self-pay | Admitting: *Deleted

## 2015-07-06 ENCOUNTER — Other Ambulatory Visit: Payer: Self-pay | Admitting: Family Medicine

## 2015-07-19 ENCOUNTER — Ambulatory Visit (INDEPENDENT_AMBULATORY_CARE_PROVIDER_SITE_OTHER): Payer: Medicare Other | Admitting: Cardiology

## 2015-07-19 ENCOUNTER — Encounter: Payer: Self-pay | Admitting: Cardiology

## 2015-07-19 VITALS — BP 130/68 | HR 66 | Ht <= 58 in | Wt 128.0 lb

## 2015-07-19 DIAGNOSIS — R0609 Other forms of dyspnea: Secondary | ICD-10-CM

## 2015-07-19 DIAGNOSIS — I480 Paroxysmal atrial fibrillation: Secondary | ICD-10-CM

## 2015-07-19 NOTE — Progress Notes (Signed)
Abigail Day Date of Birth: 07-21-1933 Medical Record #[id number]  History of Present Illness: Abigail Day is seen for follow up of Afib. She has a history of paroxysmal atrial fib - on Eliquis. EF is normal per echo back in December of 2013. Normal Myoview in June 2015. Other issues include HLD, hypothyroidism with prior ablation for hyperthyroidism, and diverticulosis. She is  on CPAP therapy for sleep apnea-followed by pulmonary.   On follow up today she is feeling very well. She thinks her AFib is doing better. She still has episodes about every 2 weeks and they may last as long as 2-3 hours. She just feels tired afterwards.  No SOB or CP. No dizziness. She has done a lot of walking recently without limitation. She is using CPAP.    Current Outpatient Prescriptions on File Prior to Visit  Medication Sig Dispense Refill  . apixaban (ELIQUIS) 5 MG TABS tablet Take 1 tablet (5 mg total) by mouth 2 (two) times daily. 180 tablet 3  . Biotin 5000 MCG TABS Take 1 tablet by mouth daily.    . Calcium Carbonate-Vitamin D (CALCIUM 600 + D PO) Take 2 tablets by mouth daily.      . Cholecalciferol (VITAMIN D) 1000 UNITS capsule Take 1,000 Units by mouth daily.      Marland Kitchen. co-enzyme Q-10 30 MG capsule Take 30 mg by mouth daily.      Marland Kitchen. CRANBERRY PO Take by mouth.    . ibandronate (BONIVA) 150 MG tablet TAKE ONE TABLET BY MOUTH ONCE EVERY 30 DAYS 3 tablet 0  . lactase (LACTAID) 3000 UNITS tablet Take 3,000 Units by mouth 3 (three) times daily with meals.    Marland Kitchen. levothyroxine (SYNTHROID, LEVOTHROID) 75 MCG tablet Take 1 tablet (75 mcg total) by mouth daily. 90 tablet 3  . loratadine (CLARITIN) 10 MG tablet Take 10 mg by mouth daily.    . metoprolol tartrate (LOPRESSOR) 25 MG tablet TAKE ONE TABLET BY MOUTH TWICE DAILY 180 tablet 2  . Misc Natural Products (OSTEO BI-FLEX JOINT SHIELD PO) Take 2 tablets by mouth daily.    . Multiple Vitamin (MULTI-VITAMIN) tablet Take 1 tablet by mouth daily.      . Multiple  Vitamins-Minerals (ICAPS PO) Take 1 tablet by mouth daily.    . Omega-3 Fatty Acids (FISH OIL PO) Take 1,200 mg by mouth 2 (two) times daily.     . pravastatin (PRAVACHOL) 40 MG tablet Take 2 tablets (80 mg total) by mouth daily. 180 tablet 3   No current facility-administered medications on file prior to visit.    No Known Allergies  Past Medical History  Diagnosis Date  . Pure hypercholesterolemia   . Osteopenia   . Diverticulosis   . Urge urinary incontinence   . Hemorrhoids     internal and external  . Glaucoma suspect   . Unspecified hypothyroidism     s/p ablation for hyperthyroidism  . Hyperplastic colon polyp 09/25/10    Dr. Ewing SchleinMagod  . Hemorrhoids 09/25/10    internal and external  . Palpitations 02/18/2012  . Dyspnea on exertion 02/18/2012  . Paroxysmal atrial fibrillation (HCC)   . Sleep apnea   . Eye exam abnormal 05/08/15    Dr.Groat-glaucoma suspect    Past Surgical History  Procedure Laterality Date  . Abdominal hysterectomy  age 82    with BSO and bladder tack  . Eye surgery      bilateral cataract surgery  . Colonoscopy  11/06, 09/25/10  hyperplastic polyp 2012  . Laparoscopic nissen fundoplication  8/07    Dr. Daphine Deutscher  . Esophagogastroduodenoscopy  09/25/10    tortuous esophagus, intact Nissen fundoplication, dilated the spasm at esophageal GE junction    History  Smoking status  . Never Smoker   Smokeless tobacco  . Never Used    History  Alcohol Use  . 0.0 oz/week  . 0 Standard drinks or equivalent per week    Comment: once a month or less    Family History  Problem Relation Age of Onset  . Heart disease Mother   . Heart disease Father   . Diabetes Father   . Marfan syndrome Sister   . Heart disease Sister     valve replacement  . Heart disease Brother   . Diabetes Brother   . Heart disease Brother   . Diabetes Brother   . Heart disease Brother   . Heart disease Brother   . Heart disease Brother     CABG  . Heart disease Brother      CABG  . Heart disease Sister     pacemaker  . Cancer Sister 51    breast cancer  . Aortic aneurysm Sister   . Cancer Sister 105    colon cancer  . Heart disease Sister     CABG  . Heart disease Brother   . Heart disease Sister   . Hyperlipidemia Sister   . Stroke Sister     Review of Systems: The review of systems is per the HPI.  All other systems were reviewed and are negative.  Physical Exam: BP 130/68 mmHg  Pulse 66  Ht  (1.448 m)  Wt 58.06 kg (128 lb)  BMI 27.69 kg/m2 Patient is very pleasant and in no acute distress. Skin is warm and dry. Color is normal.  HEENT is unremarkable. Normocephalic/atraumatic. PERRL. Sclera are nonicteric. Neck is supple. No masses. No JVD. Lungs are clear. Cardiac exam shows a regular rate and rhythm. No gallop or murmur. Abdomen is soft. Extremities are without edema. Gait and ROM are intact. No gross neurologic deficits noted.  LABORATORY DATA:     Lab Results  Component Value Date   WBC 3.4* 03/29/2015   HGB 13.3 03/29/2015   HCT 39.5 03/29/2015   PLT 249 03/29/2015   GLUCOSE 95 12/21/2014   CHOL 175 12/21/2014   TRIG 163* 12/21/2014   HDL 49 12/21/2014   LDLCALC 93 12/21/2014   ALT 18 12/21/2014   AST 22 12/21/2014   NA 137 12/21/2014   K 4.4 12/21/2014   CL 102 12/21/2014   CREATININE 0.70 12/21/2014   BUN 16 12/21/2014   CO2 26 12/21/2014   TSH 1.082 12/21/2014   HGBA1C 5.4 03/29/2015   Ecg today shows NSR with normal nonspecific TWA.  I have personally reviewed and interpreted this study.   Assessment / Plan:  1. Obstructive sleep apnea. On CPAP therapy  2. PAF - on beta blocker and Eliquis - she is in sinus by exam today. Episodes of Afib are relatively infrequent and short lived. Will continue with current rate control strategy. I don't think her current symptoms warrant additional antiarrhythmic therapy.   Follow up in 6 months.

## 2015-07-19 NOTE — Patient Instructions (Signed)
Continue your current therapy  I will see you in 6 months.   

## 2015-09-24 ENCOUNTER — Other Ambulatory Visit: Payer: Medicare Other

## 2015-09-24 DIAGNOSIS — E039 Hypothyroidism, unspecified: Secondary | ICD-10-CM

## 2015-09-24 DIAGNOSIS — E782 Mixed hyperlipidemia: Secondary | ICD-10-CM

## 2015-09-24 DIAGNOSIS — Z5181 Encounter for therapeutic drug level monitoring: Secondary | ICD-10-CM

## 2015-09-24 DIAGNOSIS — R7301 Impaired fasting glucose: Secondary | ICD-10-CM

## 2015-09-24 LAB — COMPREHENSIVE METABOLIC PANEL
ALBUMIN: 4.2 g/dL (ref 3.6–5.1)
ALT: 19 U/L (ref 6–29)
AST: 21 U/L (ref 10–35)
Alkaline Phosphatase: 44 U/L (ref 33–130)
BILIRUBIN TOTAL: 0.4 mg/dL (ref 0.2–1.2)
BUN: 16 mg/dL (ref 7–25)
CALCIUM: 9 mg/dL (ref 8.6–10.4)
CHLORIDE: 106 mmol/L (ref 98–110)
CO2: 27 mmol/L (ref 20–31)
Creat: 0.76 mg/dL (ref 0.60–0.88)
Glucose, Bld: 97 mg/dL (ref 65–99)
Potassium: 4.3 mmol/L (ref 3.5–5.3)
Sodium: 140 mmol/L (ref 135–146)
TOTAL PROTEIN: 5.8 g/dL — AB (ref 6.1–8.1)

## 2015-09-24 LAB — LIPID PANEL
CHOL/HDL RATIO: 3.2 ratio (ref <=5.0)
CHOLESTEROL: 150 mg/dL (ref 125–200)
HDL: 47 mg/dL (ref >=46)
LDL Cholesterol: 70 mg/dL (ref <130)
Triglycerides: 166 mg/dL — ABNORMAL HIGH (ref <150)
VLDL: 33 mg/dL — ABNORMAL HIGH (ref <30)

## 2015-09-24 LAB — HEMOGLOBIN A1C
Hgb A1c MFr Bld: 5.4 % (ref <5.7)
Mean Plasma Glucose: 108 mg/dL

## 2015-09-24 LAB — TSH: TSH: 0.72 m[IU]/L

## 2015-09-26 NOTE — Progress Notes (Signed)
Chief Complaint  Patient presents with  . Hypothyroidism    nonfasting med check-labs already done. Has been having leg cramping x 3-4 months, wonders if it's her Boniva-would like to discuss.    Hypothyroidism: Denies changes in energy, skin, nails, moods. Bowels--unchanged; some loose stools after certain foods (including yogurt), somewhat better since using Lactaid.  Every once in a while she has flares of diarrhea.  Denies blood or mucus in the stool.    Mixed hyperlipidemia: She has only been taking 2 fish oil daily (previously took 3), along with the pravastatin. Compliant with medications and denies medication side effects. She previously described some "crawling" discomfort in her legs, mainly when they are cold, which she reports is the same.She also described aching in her legs which feels better when she uses the electric throw/blanket.  She feels like the discomfort in the legs/bones has gotten a little worse "bones are hurting". She continues to walk regularly, and the legs don't bother her as much when she walks (worse when not walking as much recently due to the extreme heat). Pain occurs during the day, when sitting on the couch. She is concerned it could be from the [place] and wants to take a break from it.  Atrial fibrillation: She remains on blood thinners without any bleeding or complications. She denies tachycardia, no known episodes of atrial fibrillation (she used to get short spells at night sometimes, no longer does).  She denies chest pain, shortness of breath.  Sometimes slightly winded after walking up 3 flights of stairs--unchanged. Sees Dr. [name] every 6 months.  HTN: Occasionally checks BP, runs 110/60 in the mornings, sometimes up to 130/60's later in the day.  No headaches, dizziness, chest pain. Sometimes she feels like her head is "tight" when she first gets up in the morning--denies dizziness.  BP is usually low at that time.  OSA: She is using nasal  CPAP, and doing well. She last saw Dr. Craige Cotta in February.  Doing well. She feels more refreshed since being on CPAP.   Osteopenia--She restarted Boniva after her last bone density 04/2014. She had switched to taking it in the evening (but making sure to stay upright for an hour before bed) due to timing with her thyroid medication, but changed it back to morning, timing it 2 hours different from the thyroid medication. Denies any reflux, chest pain or other side effects (she wonders if the leg pain is a side effect). She had DEXA in 04/2014: T-1.9 at L femur, -1.6 at L forearm.  Allergies--she has been taking a daily antihistamine which has improved her postnasal drainage and allergy symptoms.  She gets some hoarseness in the afternoons, unchanged.  Urinary frequency.She took physical therapy, doing Kegel's, and she reports doing much better. She only gets up 1-3x/night, not as bad as it has been in the past. Sometimes she has trouble holding the urine (urge incontinence).   PMH, PSH, SH reviewed.  Outpatient Encounter Prescriptions as of 09/27/2015  Medication Sig Note  . apixaban (ELIQUIS) 5 MG TABS tablet Take 1 tablet (5 mg total) by mouth 2 (two) times daily.   . Biotin 5000 MCG TABS Take 1 tablet by mouth daily.   . Calcium Carbonate-Vitamin D (CALCIUM 600 + D PO) Take 2 tablets by mouth daily.     . Cholecalciferol (VITAMIN D) 1000 UNITS capsule Take 1,000 Units by mouth daily.     Marland Kitchen co-enzyme Q-10 30 MG capsule Take 30 mg by mouth daily.     Marland Kitchen  CRANBERRY PO Take by mouth.   . ibandronate (BONIVA) 150 MG tablet TAKE ONE TABLET BY MOUTH ONCE EVERY MONTH. TAKE WITH FULL GLASS OF WATER AND DO NOT EAT/RECLINE FOR 30 MIN   . lactase (LACTAID) 3000 UNITS tablet Take 3,000 Units by mouth 3 (three) times daily with meals. 03/27/2014: Takes 1 prior to breakfast and lunch, 2 prior to dinner  . levothyroxine (SYNTHROID, LEVOTHROID) 75 MCG tablet Take 1 tablet (75 mcg total) by mouth daily.   Marland Kitchen  loratadine (CLARITIN) 10 MG tablet Take 10 mg by mouth daily.   . metoprolol tartrate (LOPRESSOR) 25 MG tablet TAKE ONE TABLET BY MOUTH TWICE DAILY   . Misc Natural Products (OSTEO BI-FLEX JOINT SHIELD PO) Take 2 tablets by mouth daily.   . Multiple Vitamin (MULTI-VITAMIN) tablet Take 1 tablet by mouth daily.     . Multiple Vitamins-Minerals (ICAPS PO) Take 1 tablet by mouth daily.   . Omega-3 Fatty Acids (FISH OIL PO) Take 1,200 mg by mouth 2 (two) times daily.  09/27/2015: Two a day  . pravastatin (PRAVACHOL) 40 MG tablet Take 2 tablets (80 mg total) by mouth daily.   . [DISCONTINUED] ibandronate (BONIVA) 150 MG tablet TAKE ONE TABLET BY MOUTH ONCE EVERY 30 DAYS    No facility-administered encounter medications on file as of 09/27/2015.    No Known Allergies  ROS:  No fever, chills, URI symptoms, allergies are controlled.  No nausea, vomiting, abdominal pain.  Intermittent diarrhea as per HPI, no blood or mucus in the stool. No dysuria, hematuria. +leg pains as per HPI.  No chest pain, palpitations, headaches, dizziness, shortness of breath. Weight gain 2# since last visit.    PHYSICAL EXAM:  BP 112/68 (BP Location: Left Arm, Patient Position: Sitting, Cuff Size: Normal)   Pulse 68   Ht 4' 9.5" (1.461 m)   Wt 130 lb 3.2 oz (59.1 kg)   BMI 27.69 kg/m   Well developed, pleasant female in no distress HEENT: PERRL, EOMI, conjunctiva clear, OP clear Neck: no lymphadenopathy or thyromegaly, no carotid bruit Heart: regular rate and rhythm, no murmur, rub, gallop Lungs: clear bilaterally Abdomen: soft, nontender, no organomegaly or mass. Extremities: no edema, normal pulses. 2nd and 3rd toes bilaterally are fused proximally, (separated only at the distal phalanx). Slightly prominent veins anteriorly, minimal posteriorly. Calves nontender, negative Homan. Skin: no rashes or bruising Neuro: alert and oriented, cranial nerves intact, normal strength, gait Psych: Normal mood, affect,  hygiene and grooming     Chemistry      Component Value Date/Time   NA 140 09/24/2015 0753   K 4.3 09/24/2015 0753   CL 106 09/24/2015 0753   CO2 27 09/24/2015 0753   BUN 16 09/24/2015 0753   CREATININE 0.76 09/24/2015 0753      Component Value Date/Time   CALCIUM 9.0 09/24/2015 0753   ALKPHOS 44 09/24/2015 0753   AST 21 09/24/2015 0753   ALT 19 09/24/2015 0753   BILITOT 0.4 09/24/2015 0753     Fasting glucose 97  Lab Results  Component Value Date   HGBA1C 5.4 09/24/2015   Lab Results  Component Value Date   TSH 0.72 09/24/2015   Lab Results  Component Value Date   CHOL 150 09/24/2015   HDL 47 09/24/2015   LDLCALC 70 09/24/2015   TRIG 166 (H) 09/24/2015   CHOLHDL 3.2 09/24/2015    ASSESSMENT/PLAN:  Mixed hyperlipidemia - borderline TG; diet reviewed, increase fish oil to 3/d  Hypothyroidism, unspecified hypothyroidism type -  adequately replaced  Paroxysmal atrial fibrillation (HCC) - currently in NSR. Continue anticoagulation and current meds  Obstructive sleep apnea - continue CPAP use  Osteopenia - okay to skip Boniva x 1-2 mos to see if leg pain improves (suspect statin more than Boniva). Continue Ca, D, Wt bearing exercise - Plan: ibandronate (BONIVA) 150 MG tablet  Impaired fasting glucose - now normal  Urge incontinence of urine - improved/stable   Also discussed probiotics Due for colonoscopy--to schedule OV with Dr. Evette Cristal to discuss screening options, especially now that she is on blood thinners.  Ddx reviewed for her leg pain  I suspect that your pain in your legs is related to the veins, since it is mostly when you are seated and feels BETTER when you move them around and/or walk. Try keeping your legs elevated when watching TV or sitting, or start wearing compression stockings routinely to see if this helps. The other diagnosis we briefly discussed was restless leg syndrome--I would have to look back to your sleep study to see if they found  this. There are medications to treat this, if needed. Make sure you are drinking plenty of water. Continue the coenzyme Q10. I suspect it would be more likely your pravastatin causing the leg pain rather than the Boniva, but if needed, we could skip 2-3 weeks of the statin to see if your symptoms got better, vs skipping 1 month of the Boniva.  F/u 6 months for AWV/med check with labs prior

## 2015-09-27 ENCOUNTER — Encounter: Payer: Self-pay | Admitting: Family Medicine

## 2015-09-27 ENCOUNTER — Ambulatory Visit (INDEPENDENT_AMBULATORY_CARE_PROVIDER_SITE_OTHER): Payer: Medicare Other | Admitting: Family Medicine

## 2015-09-27 VITALS — BP 112/68 | HR 68 | Ht <= 58 in | Wt 130.2 lb

## 2015-09-27 DIAGNOSIS — E782 Mixed hyperlipidemia: Secondary | ICD-10-CM | POA: Diagnosis not present

## 2015-09-27 DIAGNOSIS — Z5181 Encounter for therapeutic drug level monitoring: Secondary | ICD-10-CM

## 2015-09-27 DIAGNOSIS — R7301 Impaired fasting glucose: Secondary | ICD-10-CM

## 2015-09-27 DIAGNOSIS — N3941 Urge incontinence: Secondary | ICD-10-CM | POA: Diagnosis not present

## 2015-09-27 DIAGNOSIS — G4733 Obstructive sleep apnea (adult) (pediatric): Secondary | ICD-10-CM | POA: Diagnosis not present

## 2015-09-27 DIAGNOSIS — I480 Paroxysmal atrial fibrillation: Secondary | ICD-10-CM

## 2015-09-27 DIAGNOSIS — Z7901 Long term (current) use of anticoagulants: Secondary | ICD-10-CM | POA: Diagnosis not present

## 2015-09-27 DIAGNOSIS — E039 Hypothyroidism, unspecified: Secondary | ICD-10-CM | POA: Diagnosis not present

## 2015-09-27 DIAGNOSIS — M8580 Other specified disorders of bone density and structure, unspecified site: Secondary | ICD-10-CM | POA: Diagnosis not present

## 2015-09-27 MED ORDER — IBANDRONATE SODIUM 150 MG PO TABS: ORAL_TABLET | ORAL | 1 refills | Status: DC

## 2015-09-27 NOTE — Patient Instructions (Addendum)
Continue all of your current medications. Your sugar is normal. Your thyroid dose is correct. Your cholesterol looked good--the triglycerides were still just slightly high. Continue to avoid sweets/sugar/fried foods. Consider taking 3 fish oil daily rather than 2 (if it doesn't cause side effects).   I suspect that your pain in your legs is related to the veins, since it is mostly when you are seated and feels BETTER when you move them around and/or walk. Try keeping your legs elevated when watching TV or sitting, or start wearing compression stockings routinely to see if this helps. The other diagnosis we briefly discussed was restless leg syndrome--I would have to look back to your sleep study to see if they found this. There are medications to treat this, if needed. Make sure you are drinking plenty of water. Continue the coenzyme Q10. I suspect it would be more likely your pravastatin causing the leg pain rather than the Boniva, but if needed, we could skip 2-3 weeks of the statin to see if your symptoms got better, vs skipping 1 month of the Boniva.

## 2015-10-08 ENCOUNTER — Other Ambulatory Visit: Payer: Self-pay | Admitting: Family Medicine

## 2015-11-14 ENCOUNTER — Encounter: Payer: Self-pay | Admitting: Pulmonary Disease

## 2015-11-14 ENCOUNTER — Ambulatory Visit (INDEPENDENT_AMBULATORY_CARE_PROVIDER_SITE_OTHER): Payer: Medicare Other | Admitting: Pulmonary Disease

## 2015-11-14 VITALS — BP 102/62 | HR 85 | Ht <= 58 in | Wt 131.0 lb

## 2015-11-14 DIAGNOSIS — Z9989 Dependence on other enabling machines and devices: Principal | ICD-10-CM

## 2015-11-14 DIAGNOSIS — G4733 Obstructive sleep apnea (adult) (pediatric): Secondary | ICD-10-CM | POA: Diagnosis not present

## 2015-11-14 NOTE — Progress Notes (Signed)
Current Outpatient Prescriptions on File Prior to Visit  Medication Sig  . apixaban (ELIQUIS) 5 MG TABS tablet Take 1 tablet (5 mg total) by mouth 2 (two) times daily.  . Biotin 5000 MCG TABS Take 1 tablet by mouth daily.  . Calcium Carbonate-Vitamin D (CALCIUM 600 + D PO) Take 2 tablets by mouth daily.    . Cholecalciferol (VITAMIN D) 1000 UNITS capsule Take 1,000 Units by mouth daily.    Marland Kitchen. co-enzyme Q-10 30 MG capsule Take 30 mg by mouth daily.    Marland Kitchen. CRANBERRY PO Take by mouth.  . ibandronate (BONIVA) 150 MG tablet TAKE ONE TABLET BY MOUTH ONCE EVERY MONTH. TAKE WITH FULL GLASS OF WATER AND DO NOT EAT/RECLINE FOR 30 MIN  . lactase (LACTAID) 3000 UNITS tablet Take 3,000 Units by mouth 3 (three) times daily with meals.  Marland Kitchen. levothyroxine (SYNTHROID, LEVOTHROID) 75 MCG tablet Take 1 tablet (75 mcg total) by mouth daily.  Marland Kitchen. loratadine (CLARITIN) 10 MG tablet Take 10 mg by mouth daily.  . metoprolol tartrate (LOPRESSOR) 25 MG tablet TAKE ONE TABLET BY MOUTH TWICE DAILY  . Misc Natural Products (OSTEO BI-FLEX JOINT SHIELD PO) Take 2 tablets by mouth daily.  . Multiple Vitamin (MULTI-VITAMIN) tablet Take 1 tablet by mouth daily.    . Multiple Vitamins-Minerals (ICAPS PO) Take 1 tablet by mouth daily.  . Omega-3 Fatty Acids (FISH OIL PO) Take 1,200 mg by mouth 2 (two) times daily.   . pravastatin (PRAVACHOL) 40 MG tablet Take 2 tablets (80 mg total) by mouth daily.   No current facility-administered medications on file prior to visit.      Chief Complaint  Patient presents with  . Follow-up    Wears CPAP nightly. Pt states that she is having a hard time keeping nose piece in nose. DME: [facility].     Sleep tests PSG 08/18/12 >> AHI 23, SpO2 low 81% CPAP 08/16/16 to 11/14/15 >> used on 89 of 90 nights with average 6 hrs 8 min.  Average AHI 2.9 with median CPAP 10 and 95 th percentile CPAP 14 cm H2O  Past medical history PAF, HLD, Osteopenia, Diverticulosis, Hypothyroidism  Past surgical history,  Family history, Social history, Allergies reviewed  Vital Signs BP 102/62 (BP Location: Right Arm, Cuff Size: Normal)   Pulse 85   Ht 4\' 8"  (1.422 m)   Wt 131 lb (59.4 kg)   SpO2 96%   BMI 29.37 kg/m   History of Present Illness Abigail Day is a 82 y.o. female with OSA.  She feels that CPAP helps her sleep and daytime alertness.  Her main issue is with mask fit and dry mouth.  She gets raspy voice as a result.  Physical Exam  General - No distress ENT - No sinus tenderness, no oral exudate, no LAN, raspy voice Cardiac - s1s2 regular, no murmur Chest - No wheeze/rales/dullness Back - No focal tenderness Abd - Soft, non-tender Ext - No edema Neuro - Normal strength Skin - No rashes Psych - normal mood, and behavior   Assessment/Plan  Obstructive sleep apnea. - she is compliant with CPAP and reports benefit - continue auto CPAP - she will check with DME about mask refit   Patient Instructions  Talk to [facility] about getting CPAP mask refit  Follow up in 1 year    Coralyn HellingVineet Psalm Schappell, MD [place] [facility] Pager:  [phone] 11/14/2015, 2:27 PM

## 2015-11-14 NOTE — Patient Instructions (Signed)
Talk to [facility] about getting CPAP mask refit  Follow up in 1 year

## 2015-12-13 ENCOUNTER — Other Ambulatory Visit (INDEPENDENT_AMBULATORY_CARE_PROVIDER_SITE_OTHER): Payer: Medicare Other

## 2015-12-13 DIAGNOSIS — K591 Functional diarrhea: Secondary | ICD-10-CM | POA: Diagnosis not present

## 2015-12-13 DIAGNOSIS — Z23 Encounter for immunization: Secondary | ICD-10-CM

## 2015-12-13 DIAGNOSIS — Z800 Family history of malignant neoplasm of digestive organs: Secondary | ICD-10-CM | POA: Diagnosis not present

## 2015-12-17 ENCOUNTER — Telehealth: Payer: Self-pay

## 2015-12-17 NOTE — Telephone Encounter (Signed)
Received clearance form from Dr.Magod's office.Dr.Jordan advised ok to hold Eliquis 2 days prior to colonoscopy.Form faxed back to fax # [phone].

## 2015-12-24 ENCOUNTER — Telehealth: Payer: Self-pay | Admitting: Cardiology

## 2015-12-24 NOTE — Telephone Encounter (Signed)
New message   Pt verbalized that she is calling about Dr.Maygod and the procedure and if medications will be held and how long

## 2015-12-24 NOTE — Telephone Encounter (Signed)
Returned call to patient stated she is scheduled to have a colonoscopy tomorrow.Stated she has been holding  Eliquis since yesterday,wanting to know when to restart.Advised to ask Dr.Magod after colonoscopy when she can restart.

## 2015-12-25 DIAGNOSIS — R197 Diarrhea, unspecified: Secondary | ICD-10-CM | POA: Diagnosis not present

## 2015-12-25 DIAGNOSIS — K5730 Diverticulosis of large intestine without perforation or abscess without bleeding: Secondary | ICD-10-CM | POA: Diagnosis not present

## 2015-12-25 DIAGNOSIS — Z800 Family history of malignant neoplasm of digestive organs: Secondary | ICD-10-CM | POA: Diagnosis not present

## 2015-12-25 LAB — HM COLONOSCOPY

## 2015-12-27 DIAGNOSIS — R197 Diarrhea, unspecified: Secondary | ICD-10-CM | POA: Diagnosis not present

## 2015-12-31 ENCOUNTER — Other Ambulatory Visit: Payer: Self-pay | Admitting: Family Medicine

## 2016-01-04 ENCOUNTER — Encounter: Payer: Self-pay | Admitting: Family Medicine

## 2016-01-08 ENCOUNTER — Telehealth: Payer: Self-pay | Admitting: Family Medicine

## 2016-01-08 NOTE — Telephone Encounter (Signed)
P.A. PRAVASTATIN

## 2016-01-16 ENCOUNTER — Encounter: Payer: Self-pay | Admitting: Family Medicine

## 2016-01-17 ENCOUNTER — Encounter: Payer: Self-pay | Admitting: Cardiology

## 2016-01-17 ENCOUNTER — Encounter: Payer: Self-pay | Admitting: Family Medicine

## 2016-01-19 ENCOUNTER — Other Ambulatory Visit: Payer: Self-pay | Admitting: Family Medicine

## 2016-01-19 DIAGNOSIS — E039 Hypothyroidism, unspecified: Secondary | ICD-10-CM

## 2016-01-22 ENCOUNTER — Other Ambulatory Visit: Payer: Self-pay | Admitting: Family Medicine

## 2016-01-22 DIAGNOSIS — Z1231 Encounter for screening mammogram for malignant neoplasm of breast: Secondary | ICD-10-CM

## 2016-01-23 ENCOUNTER — Other Ambulatory Visit: Payer: Self-pay | Admitting: Cardiology

## 2016-01-23 ENCOUNTER — Encounter: Payer: Self-pay | Admitting: Family Medicine

## 2016-01-23 DIAGNOSIS — I480 Paroxysmal atrial fibrillation: Secondary | ICD-10-CM

## 2016-01-23 MED ORDER — APIXABAN 2.5 MG PO TABS: 2.5000 mg | ORAL_TABLET | Freq: Two times a day (BID) | ORAL | 1 refills | Status: DC

## 2016-01-23 NOTE — Telephone Encounter (Signed)
Spoke to patient and made aware that based on weight and age would dose reduce to 2.5mg  BID. She states she has lost weight recently and plans to keep it down. She has an appt with Dr. [place] in two weeks as well.  Will send RX for Eliquis 2.5mg  BID. Pt states understanding and appreciation.

## 2016-01-23 NOTE — Telephone Encounter (Signed)
Based on Age and Weight pt would meet criteria for Eliquis 2.5mg  BID.

## 2016-02-06 NOTE — Progress Notes (Signed)
Abigail ClanMary B Magoon Date of Birth: 12/11/1933 Medical Record #[id number]  History of Present Illness: [name] is seen for follow up of Afib. She has a history of paroxysmal atrial fib - on Eliquis. EF is normal per echo back in December of 2013. Normal Myoview in June 2015. Other issues include HLD, hypothyroidism with prior ablation for hyperthyroidism, and diverticulosis. She is  on CPAP therapy for sleep apnea-followed by pulmonary.   On follow up today she is feeling very well. She is having some irregular heart beat daily lasting for minutes.   No SOB or CP. No dizziness. She only notes SOB when walking up 3 flights of stairs.  She is using CPAP. Her Eliquis dose was decreased to 2.5 mg bid due to age [age], and weight < 60 kg.   In October 2017 she underwent colonoscopy for evaluation of diarrhea. She had hemorrhoids and multiple diverticuli. No polyps. Bx negative for colitis. Now on probiotics with improvement.    Current Outpatient Prescriptions on File Prior to Visit  Medication Sig Dispense Refill  . apixaban (ELIQUIS) 2.5 MG TABS tablet Take 1 tablet (2.5 mg total) by mouth 2 (two) times daily. 180 tablet 1  . Calcium Carbonate-Vitamin D (CALCIUM 600 + D PO) Take 2 tablets by mouth daily.      . Cholecalciferol (VITAMIN D) 1000 UNITS capsule Take 1,000 Units by mouth daily.      Marland Kitchen. co-enzyme Q-10 30 MG capsule Take 30 mg by mouth daily.      Marland Kitchen. CRANBERRY PO Take by mouth.    . ibandronate (BONIVA) 150 MG tablet TAKE ONE TABLET BY MOUTH ONCE EVERY MONTH. TAKE WITH FULL GLASS OF WATER AND DO NOT EAT/RECLINE FOR 30 MIN 3 tablet 1  . lactase (LACTAID) 3000 UNITS tablet Take 3,000 Units by mouth 3 (three) times daily with meals.    Marland Kitchen. levothyroxine (SYNTHROID, LEVOTHROID) 75 MCG tablet TAKE ONE TABLET BY MOUTH ONCE DAILY 90 tablet 0  . loratadine (CLARITIN) 10 MG tablet Take 10 mg by mouth daily.    . metoprolol tartrate (LOPRESSOR) 25 MG tablet TAKE ONE TABLET BY MOUTH TWICE DAILY 180 tablet 2  .  Misc Natural Products (OSTEO BI-FLEX JOINT SHIELD PO) Take 2 tablets by mouth daily.    . Multiple Vitamin (MULTI-VITAMIN) tablet Take 1 tablet by mouth daily.      . Multiple Vitamins-Minerals (ICAPS PO) Take 1 tablet by mouth daily.    . Omega-3 Fatty Acids (FISH OIL PO) Take 1,200 mg by mouth 2 (two) times daily.     . pravastatin (PRAVACHOL) 40 MG tablet Take 2 tablets (80 mg total) by mouth daily. 180 tablet 3  . pravastatin (PRAVACHOL) 40 MG tablet TAKE TWO TABLETS BY MOUTH ONCE DAILY 180 tablet 0   No current facility-administered medications on file prior to visit.     No Known Allergies  Past Medical History:  Diagnosis Date  . Diverticulosis   . Dyspnea on exertion 02/18/2012  . Eye exam abnormal 05/08/15   Dr.Groat-glaucoma suspect  . Glaucoma suspect   . Hemorrhoids    internal and external  . Hemorrhoids 09/25/10   internal and external  . Hyperplastic colon polyp 09/25/10   Dr. Ewing SchleinMagod  . Osteopenia   . Palpitations 02/18/2012  . Paroxysmal atrial fibrillation (HCC)   . Pure hypercholesterolemia   . Sleep apnea   . Unspecified hypothyroidism    s/p ablation for hyperthyroidism  . Urge urinary incontinence  Past Surgical History:  Procedure Laterality Date  . ABDOMINAL HYSTERECTOMY  age 82   with BSO and bladder tack  . COLONOSCOPY  11/06, 09/25/10   hyperplastic polyp 2012  . ESOPHAGOGASTRODUODENOSCOPY  09/25/10   tortuous esophagus, intact Nissen fundoplication, dilated the spasm at esophageal GE junction  . EYE SURGERY     bilateral cataract surgery  . LAPAROSCOPIC NISSEN FUNDOPLICATION  8/07   Dr. Daphine DeutscherMartin    History  Smoking Status  . Never Smoker  Smokeless Tobacco  . Never Used    History  Alcohol Use  . 0.0 oz/week    Comment: once a month or less    Family History  Problem Relation Age of Onset  . Heart disease Mother   . Heart disease Father   . Diabetes Father   . Marfan syndrome Sister   . Heart disease Sister     valve  replacement  . Heart disease Brother   . Diabetes Brother   . Heart disease Brother   . Diabetes Brother   . Heart disease Brother   . Heart disease Brother   . Heart disease Brother     CABG  . Heart disease Brother     CABG  . Heart disease Sister     pacemaker  . Cancer Sister [id number]    breast cancer  . Aortic aneurysm Sister   . Cancer Sister [id number]    colon cancer  . Heart disease Sister     CABG  . Heart disease Brother   . Heart disease Sister   . Hyperlipidemia Sister   . Stroke Sister     Review of Systems: The review of systems is per the HPI.  All other systems were reviewed and are negative.  Physical Exam: BP 119/70   Pulse (!) 57   Ht 4\' 8"  (1.422 m)   Wt 131 lb 9.6 oz (59.7 kg)   BMI 29.50 kg/m  Patient is very pleasant and in no acute distress. Skin is warm and dry. Color is normal.  HEENT is unremarkable. Normocephalic/atraumatic. PERRL. Sclera are nonicteric. Neck is supple. No masses. No JVD. Lungs are clear. Cardiac exam shows a regular rate and rhythm. No gallop or murmur. Abdomen is soft. Extremities are without edema. Gait and ROM are intact. No gross neurologic deficits noted.  LABORATORY DATA:     Lab Results  Component Value Date   WBC 3.4 (L) 03/29/2015   HGB 13.3 03/29/2015   HCT 39.5 03/29/2015   PLT 249 03/29/2015   GLUCOSE 97 09/24/2015   CHOL 150 09/24/2015   TRIG 166 (H) 09/24/2015   HDL 47 09/24/2015   LDLCALC 70 09/24/2015   ALT 19 09/24/2015   AST 21 09/24/2015   NA 140 09/24/2015   K 4.3 09/24/2015   CL 106 09/24/2015   CREATININE 0.76 09/24/2015   BUN 16 09/24/2015   CO2 27 09/24/2015   TSH 0.72 09/24/2015   HGBA1C 5.4 09/24/2015     Assessment / Plan:  1. Obstructive sleep apnea. On CPAP therapy  2. PAF - on beta blocker and Eliquis - she is in sinus by exam today. Episodes of Afib are relatively infrequent and short lived. Will continue with current rate control strategy.   Follow up in 6 months.

## 2016-02-07 ENCOUNTER — Encounter: Payer: Self-pay | Admitting: Cardiology

## 2016-02-07 ENCOUNTER — Ambulatory Visit (INDEPENDENT_AMBULATORY_CARE_PROVIDER_SITE_OTHER): Payer: Medicare Other | Admitting: Cardiology

## 2016-02-07 VITALS — BP 119/70 | HR 57 | Ht <= 58 in | Wt 131.6 lb

## 2016-02-07 DIAGNOSIS — E782 Mixed hyperlipidemia: Secondary | ICD-10-CM

## 2016-02-07 DIAGNOSIS — I480 Paroxysmal atrial fibrillation: Secondary | ICD-10-CM | POA: Diagnosis not present

## 2016-02-07 NOTE — Patient Instructions (Signed)
Continue your current therapy  I will see you in 6 months.   

## 2016-02-12 ENCOUNTER — Ambulatory Visit: Payer: Medicare Other | Admitting: Cardiology

## 2016-02-28 ENCOUNTER — Ambulatory Visit
Admission: RE | Admit: 2016-02-28 | Discharge: 2016-02-28 | Disposition: A | Discharge status: Home / Self Care | Payer: Medicare Other | Source: Ambulatory Visit | Attending: Family Medicine | Admitting: Family Medicine

## 2016-02-28 DIAGNOSIS — Z1231 Encounter for screening mammogram for malignant neoplasm of breast: Secondary | ICD-10-CM | POA: Diagnosis not present

## 2016-03-26 ENCOUNTER — Telehealth: Payer: Self-pay | Admitting: Family Medicine

## 2016-03-26 NOTE — Telephone Encounter (Signed)
From previous telephone call P.A. Pravastatin was closed before noted that P.A. Was not required

## 2016-04-05 ENCOUNTER — Other Ambulatory Visit: Payer: Self-pay | Admitting: Family Medicine

## 2016-04-18 ENCOUNTER — Other Ambulatory Visit: Payer: Self-pay | Admitting: Family Medicine

## 2016-04-18 DIAGNOSIS — E039 Hypothyroidism, unspecified: Secondary | ICD-10-CM

## 2016-04-21 ENCOUNTER — Other Ambulatory Visit: Payer: Medicare Other

## 2016-04-21 ENCOUNTER — Telehealth: Payer: Self-pay | Admitting: Family Medicine

## 2016-04-21 DIAGNOSIS — Z5181 Encounter for therapeutic drug level monitoring: Secondary | ICD-10-CM

## 2016-04-21 DIAGNOSIS — E782 Mixed hyperlipidemia: Secondary | ICD-10-CM | POA: Diagnosis not present

## 2016-04-21 DIAGNOSIS — Z7901 Long term (current) use of anticoagulants: Secondary | ICD-10-CM | POA: Diagnosis not present

## 2016-04-21 DIAGNOSIS — R7301 Impaired fasting glucose: Secondary | ICD-10-CM

## 2016-04-21 LAB — CBC WITH DIFFERENTIAL/PLATELET
BASOS PCT: 0 %
Basophils Absolute: 0 cells/uL (ref 0–200)
EOS PCT: 4 %
Eosinophils Absolute: 116 cells/uL (ref 15–500)
HCT: 40.3 % (ref 35.0–45.0)
Hemoglobin: 13.5 g/dL (ref 11.7–15.5)
Lymphocytes Relative: 35 %
Lymphs Abs: 1015 cells/uL (ref 850–3900)
MCH: 33.4 pg — AB (ref 27.0–33.0)
MCHC: 33.5 g/dL (ref 32.0–36.0)
MCV: 99.8 fL (ref 80.0–100.0)
MONOS PCT: 16 %
MPV: 9.6 fL (ref 7.5–12.5)
Monocytes Absolute: 464 cells/uL (ref 200–950)
NEUTROS ABS: 1305 {cells}/uL — AB (ref 1500–7800)
Neutrophils Relative %: 45 %
PLATELETS: 209 10*3/uL (ref 140–400)
RBC: 4.04 MIL/uL (ref 3.80–5.10)
RDW: 12.8 % (ref 11.0–15.0)
WBC: 2.9 10*3/uL — AB (ref 4.0–10.5)

## 2016-04-21 LAB — COMPREHENSIVE METABOLIC PANEL
ALBUMIN: 4.2 g/dL (ref 3.6–5.1)
ALT: 16 U/L (ref 6–29)
AST: 21 U/L (ref 10–35)
Alkaline Phosphatase: 53 U/L (ref 33–130)
BILIRUBIN TOTAL: 0.5 mg/dL (ref 0.2–1.2)
BUN: 15 mg/dL (ref 7–25)
CALCIUM: 9.4 mg/dL (ref 8.6–10.4)
CO2: 29 mmol/L (ref 20–31)
CREATININE: 0.83 mg/dL (ref 0.60–0.88)
Chloride: 105 mmol/L (ref 98–110)
Glucose, Bld: 90 mg/dL (ref 65–99)
Potassium: 4.4 mmol/L (ref 3.5–5.3)
SODIUM: 142 mmol/L (ref 135–146)
TOTAL PROTEIN: 6.4 g/dL (ref 6.1–8.1)

## 2016-04-21 LAB — LIPID PANEL
CHOL/HDL RATIO: 4 ratio (ref <5.0)
Cholesterol: 190 mg/dL (ref <200)
HDL: 48 mg/dL — AB (ref >50)
LDL Cholesterol: 106 mg/dL — ABNORMAL HIGH (ref <100)
Triglycerides: 182 mg/dL — ABNORMAL HIGH (ref <150)
VLDL: 36 mg/dL — ABNORMAL HIGH (ref <30)

## 2016-04-21 NOTE — Telephone Encounter (Signed)
Gave sample box.

## 2016-04-21 NOTE — Telephone Encounter (Signed)
Pt requesting refill on Synthroid. She is coming in for appointment this week on Thursday but will run out of this med on Wednesday.

## 2016-04-23 ENCOUNTER — Other Ambulatory Visit: Payer: Self-pay | Admitting: Cardiology

## 2016-04-23 NOTE — Progress Notes (Signed)
Chief Complaint  Patient presents with  . Medicare Wellness    nonfasting (labs done)annual wellness with pelvic exam. She feels like her bones are sore. And leg cramping.    Abigail FearsMary B Day is a 83 y.o. female who presents for annual wellness visit and follow-up on chronic medical conditions.  She has the following concerns: bone pains (see below).  Hypothyroidism: Denies changes in weight, energy, skin, nails, moods. Bowels--unchanged overall; some loose stools after dinner, which is her largest meal. Had colonoscopy in October; random biopsy was normal. Taking probiotic daily.  Has loose stool in the evenings, after dinner. Improved some when she cut out eating salads. She hasn't been taking the lactaid since starting probiotic.  Mixed hyperlipidemia: She has been taking 2 fish oil daily, along with the pravastatin. Compliant with medications and denies medication side effects. She previously described some "crawling" discomfort in her legs, mainly when they are cold, which she reports is the same.She also described aching in her legs which feels better when she wears compression stockings --satates this made a significant improvement in her leg pain. She has some stinging in the bottom of her feet when she wears the compression socks, feel rough, and moisturizing at night helps.  Atrial fibrillation: She remains on blood thinners without any bleeding or complications. She denies tachycardia, no known episodes of atrial fibrillation (she may feel like she is in atrial fibrillation periodically, a few times/week, short-lived).  She denies chest pain, shortness of breath. Sometimes slightly winded after walking up 3 flights of stairs--unchanged. Sees Dr. SwazilandJordan every 6 months.  HTN: Occasionally checks BP, runs 120-140/70's.  No headaches, dizziness, chest pain. She needs to get up slowly in the morning, to avoid dizziness.   OSA: She is using nasal CPAP, and doing well. She last saw Dr. Craige CottaSood  in September. She feels more refreshed since being on CPAP.   Osteopenia--She restarted Boniva after her last bone density 04/2014.  Denies any reflux, chest pain or other side effects.Last DEXA in 04/2014: T-1.9 at L femur, -1.6 at L forearm. [facility], held it to see if Abigail HammondBoniva was contributing to her bony complaints.  She didn't notice a difference, so resumed taking it.  Bone pain has been worse in the last 2-3 months, sore everywhere--wrists/forearms, right shoulder. She had stopped osteobioflex for a month, and things got much worse.  Just restarted it this week.  Allergies--she has been taking a daily antihistamine which has improved her postnasal drainage and allergy symptoms. She gets some hoarseness in the afternoons, unchanged.  She stopped taking it for a month, but symptoms recurred. She is back to taking it daily, working well.    Urinary frequency.She took physical therapy, doing Kegel's, and she reports doing much better. She only gets up 1-3x/night, not as bad as it has been in the past. Sometimes she has trouble holding the urine (urge incontinence). Overall, continues to do well/improved.   Immunization History  Administered Date(s) Administered  . Influenza Split 01/08/2011, 01/08/2012  . Influenza, High Dose Seasonal PF 12/08/2012, 12/19/2013, 12/21/2014, 12/13/2015  . Pneumococcal Conjugate-13 03/27/2014  . Pneumococcal Polysaccharide-23 01/31/2002, 01/08/2012  . Td 10/01/2004  . Tdap 01/08/2012  . Zoster 06/01/2008   Last Pap smear: n/a  Last mammogram: 02/2016 Last colonoscopy: 12/2015, negative biopsy.  No f/u needed Last DEXA: 04/2014--T-1.9, with abnormal FRAX (21%/ 5.1% at hip). Ophtho: yearly Dentist: every 9 months  Exercise: 30 minute exercise class 3x/week; Goes  to weight room (bicycle and elliptical, and some weights) x 45 minutes every Day, and walks 30 minutes every Day.   Other doctors caring for patient  include: Cardiology: Dr. [name]  Dermatologist: Dr. Terri Piedra  Ophtho: Dr. Dione Booze  Dentist: Dr. Providence Lanius  Pulmonary (for OSA): Dr. Craige Cotta GI: Dr. Ewing Schlein   Depression screen: negative Fall Screen: negative Functional Status Screen: notable only for urinary incontinence (better since doing kegel's). See full screen in epic.  End of Life Discussion:  Patient has a living will and medical power of attorney  Past Medical History:  Diagnosis Date  . Diverticulosis   . Dyspnea on exertion 02/18/2012  . Eye exam abnormal 05/08/15   Dr.Groat-glaucoma suspect  . Glaucoma suspect   . Hemorrhoids    internal and external  . Hemorrhoids 09/25/10   internal and external  . Hyperplastic colon polyp 09/25/10   Dr. Ewing Schlein  . Osteopenia   . Palpitations 02/18/2012  . Paroxysmal atrial fibrillation (HCC)   . Pure hypercholesterolemia   . Sleep apnea   . Unspecified hypothyroidism    s/p ablation for hyperthyroidism  . Urge urinary incontinence     Past Surgical History:  Procedure Laterality Date  . ABDOMINAL HYSTERECTOMY  age 60   with BSO and bladder tack  . COLONOSCOPY  11/06, 09/25/10, 12/2015   hyperplastic polyp 2012;   . ESOPHAGOGASTRODUODENOSCOPY  09/25/10   tortuous esophagus, intact Nissen fundoplication, dilated the spasm at esophageal GE junction  . EYE SURGERY     bilateral cataract surgery  . LAPAROSCOPIC NISSEN FUNDOPLICATION  8/07   Dr. Daphine Deutscher    Social History   Social History  . Marital status: Married    Spouse name: N/A  . Number of children: 2  . Years of education: N/A   Occupational History  . retired    Social History Main Topics  . Smoking status: Never Smoker  . Smokeless tobacco: Never Used  . Alcohol use 0.0 oz/week     Comment: once a month or less  . Drug use: No  . Sexual activity: Not Currently   Other Topics Concern  . Not on file   Social History Narrative   Lives with husband. 2 sons--one in [place], one in [place].  3 grandchildren. Moved  to [facility] in 10/2012    Family History  Problem Relation Age of Onset  . Heart disease Mother   . Heart disease Father   . Diabetes Father   . Marfan syndrome Sister   . Heart disease Sister     valve replacement  . Heart disease Brother   . Diabetes Brother   . Heart disease Brother   . Diabetes Brother   . Heart disease Brother   . Heart disease Brother   . Heart disease Brother     CABG  . Heart disease Brother     CABG  . Heart disease Sister     pacemaker  . Cancer Sister 38    breast cancer  . Aortic aneurysm Sister   . Peripheral Artery Disease Sister     amputation of toes  . Cancer Sister 36    colon cancer  . Heart disease Sister     CABG at 52  . Heart disease Sister     CABG  . Heart disease Brother   . Heart disease Sister   . Hyperlipidemia Sister   . Stroke Sister     Outpatient Encounter Prescriptions as of 04/24/2016  Medication  Sig  . apixaban (ELIQUIS) 2.5 MG TABS tablet Take 1 tablet (2.5 mg total) by mouth 2 (two) times daily.  . Calcium Carbonate-Vitamin D (CALCIUM 600 + D PO) Take 2 tablets by mouth daily.    . Cholecalciferol (VITAMIN D) 1000 UNITS capsule Take 1,000 Units by mouth daily.    Marland Kitchen co-enzyme Q-10 30 MG capsule Take 30 mg by mouth daily.    Marland Kitchen CRANBERRY PO Take by mouth.  . ibandronate (BONIVA) 150 MG tablet TAKE ONE TABLET BY MOUTH ONCE EVERY MONTH. TAKE WITH FULL GLASS OF WATER AND DO NOT EAT/RECLINE FOR 30 MIN  . Lactobacillus (PROBIOTIC ACIDOPHILUS PO) Take 1 tablet by mouth every morning.  Marland Kitchen levothyroxine (SYNTHROID, LEVOTHROID) 75 MCG tablet TAKE ONE TABLET BY MOUTH ONCE DAILY  . loratadine (CLARITIN) 10 MG tablet Take 10 mg by mouth daily.  . metoprolol tartrate (LOPRESSOR) 25 MG tablet TAKE 1 TABLET BY MOUTH 2 TIMES A Day  . Misc Natural Products (OSTEO BI-FLEX JOINT SHIELD PO) Take 2 tablets by mouth daily.  . Multiple Vitamin (MULTI-VITAMIN) tablet Take 1 tablet by mouth daily.    . Multiple  Vitamins-Minerals (ICAPS PO) Take 1 tablet by mouth daily.  . Omega-3 Fatty Acids (FISH OIL PO) Take 1,200 mg by mouth 2 (two) times daily.   . pravastatin (PRAVACHOL) 40 MG tablet Take 2 tablets (80 mg total) by mouth daily.  Marland Kitchen lactase (LACTAID) 3000 UNITS tablet Take 3,000 Units by mouth 3 (three) times daily with meals.  . [DISCONTINUED] apixaban (ELIQUIS) 2.5 MG TABS tablet Take 2.5 mg by mouth 2 (two) times daily.  . [DISCONTINUED] pravastatin (PRAVACHOL) 40 MG tablet TAKE TWO TABLETS BY MOUTH ONCE DAILY   No facility-administered encounter medications on file as of 04/24/2016.     No Known Allergies   ROS: The patient denies anorexia, fever, headaches, vision changes, decreased hearing, ear pain, sore throat, breast concerns, chest pain, syncope, dyspnea on exertion (has some with stairs); denies cough, swelling, nausea, vomiting, constipation, abdominal pain, melena, hematochezia, indigestion/heartburn, dysphagia, dysuria, hematuria, vaginal bleeding, discharge, odor or itch, genital lesions, numbness (just bottom of feet at her toes, chronically), tingling, weakness, tremor, suspicious skin lesions, depression, anxiety, abnormal bleeding/bruising, or enlarged lymph nodes.  Some trouble remembering names, no other memory concerns. Occasional pain under her right shoulder blade, intermittent. +urge incontinence per HPI Rash around her waist comes and goes (usually when perspiring).  Cortisone cream helps prn, along with powder. Pain at right shoulder, forearms. 2 days ago her left knee cramped up, had trouble moving it to straighten it.  Got better after hot towel.   PHYSICAL EXAM:  BP 136/76 (BP Location: Left Arm, Patient Position: Sitting, Cuff Size: Normal)   Pulse 60   Ht 4' 9.75" (1.467 m)   Wt 129 lb 6.4 oz (58.7 kg)   BMI 27.28 kg/m    General Appearance:  Alert, cooperative, no distress, appears stated age.  Voice is somewhat hoarse/cracks, clears some with  throat-clearing  Head:  Normocephalic, without obvious abnormality, atraumatic   Eyes:  PERRL, conjunctiva/corneas clear, EOM's intact, fundi benign   Ears:  Normal TM's and external ear canals   Nose:  Nares normal, mucosa normal, no drainage or sinus tenderness.  Throat:  Lips, mucosa, and tongue normal; teeth and gums normal. Upper dentures and partials on lower.   Neck:  Supple, no lymphadenopathy; thyroid: no enlargement/tenderness/nodules; no carotid bruit or JVD   Back:  Spine nontender; ROM normal. No CVA tenderness. +kyphosis  and scoliosis.   Lungs:  Clear to auscultation bilaterally without wheezes, rales or ronchi; respirations unlabored   Chest Wall:  No tenderness or deformity   Heart:  Regular rate and rhythm, S1 and S2 normal, no murmur, rub or gallop   Breast Exam:  No tenderness, masses, or nipple discharge or inversion. No axillary lymphadenopathy   Abdomen:  Soft, nondistended, normoactive bowel sounds, nontender; no masses, no hepatosplenomegaly.  Genitalia:  Normal external genitalia without lesions. BUS and vagina normal, atrophic changes noted; No abnormal vaginal discharge. Uterus and adnexa surgically absent, no masses. Pap not performed   Rectal:  Normal tone, no masses or tenderness; guaiac negative stool   Extremities:  No clubbing, cyanosis or edema. 2+ pulses.  Pulses:  2+ and symmetric all extremities   Skin:  Skin color, texture, turgor normal, no lesions or rash  Lymph nodes:  Cervical, supraclavicular, and axillary nodes normal   Neurologic:  CNII-XII intact, normal strength, sensation and gait; reflexes 2+ and symmetric throughout. Alert and oriented.   Psych:    Normal mood, affect, hygiene and grooming      Chemistry      Component Value Date/Time   NA 142 04/21/2016 0852   K 4.4 04/21/2016 0852   CL 105 04/21/2016 0852   CO2 29 04/21/2016 0852   BUN 15  04/21/2016 0852   CREATININE 0.83 04/21/2016 0852      Component Value Date/Time   CALCIUM 9.4 04/21/2016 0852   ALKPHOS 53 04/21/2016 0852   AST 21 04/21/2016 0852   ALT 16 04/21/2016 0852   BILITOT 0.5 04/21/2016 0852     Lab Results  Component Value Date   CHOL 190 04/21/2016   HDL 48 (L) 04/21/2016   LDLCALC 106 (H) 04/21/2016   TRIG 182 (H) 04/21/2016   CHOLHDL 4.0 04/21/2016   Lab Results  Component Value Date   WBC 2.9 (L) 04/21/2016   HGB 13.5 04/21/2016   HCT 40.3 04/21/2016   MCV 99.8 04/21/2016   PLT 209 04/21/2016    ASSESSMENT/PLAN:  Medicare annual wellness visit, subsequent  Mixed hyperlipidemia - borderline; continue statin; increase fish oil to 3/d; reviewed lowfat, low cholesterol diet  Paroxysmal atrial fibrillation (HCC) - in NSR; on anticoagulation without complication  Obstructive sleep apnea - continue CPAP  Osteopenia, unspecified location - continue Boniva.  Due for repeat DEXA - Plan: DG Bone Density, ibandronate (BONIVA) 150 MG tablet  Urge incontinence of urine - improved; continue Kegel's  Anticoagulant long-term use  Postablative hypothyroidism - euthyroid by history. - Plan: DG Bone Density, levothyroxine (SYNTHROID, LEVOTHROID) 75 MCG tablet  Postmenopausal estrogen deficiency - Plan: DG Bone Density  Diarrhea, unspecified type - continue probiotic; trial lactaid prior to dinner.  reassured by negative biopsy on recent colonoscopy    Slightly low WBC. Stable. On eliquis Recheck  CBC 6 months.  Due for TSH then also Lipids--borderline.  Increase fish oil to 3/d; diet reviewed; recheck 6 mos.   F/u 6 mos, labs prior CBC, TSH, lipids, LFT   Discussed monthly self breast exams and yearly mammograms; at least 30 minutes of aerobic activity at least 5 days/week, weight-bearing exercise at least 2x/week; proper sunscreen use reviewed; healthy diet, including goals of calcium and vitamin D intake and alcohol recommendations (less  than or equal to 1 drink/Day) reviewed; regular seatbelt use; changing batteries in smoke detectors. Immunization recommendations discussed--UTD. Shingrix recommended when available. Colonoscopy recommendations reviewed, UTD. DEXA due 04/2016   MOST form reviewed and  updated, full care.   Medicare Attestation I have personally reviewed: The patient's medical and social history Their use of alcohol, tobacco or illicit drugs Their current medications and supplements The patient's functional ability including ADLs,fall risks, home safety risks, cognitive, and hearing and visual impairment Diet and physical activities Evidence for depression or mood disorders  The patient's weight, height, and BMI  have been recorded in the chart.  I have made referrals, counseling, and provided education to the patient based on review of the above and I have provided the patient with a written personalized care plan for preventive services.     Lanesha Azzaro A, MD   04/23/2016

## 2016-04-24 ENCOUNTER — Ambulatory Visit (INDEPENDENT_AMBULATORY_CARE_PROVIDER_SITE_OTHER): Payer: Medicare Other | Admitting: Family Medicine

## 2016-04-24 ENCOUNTER — Encounter: Payer: Self-pay | Admitting: Family Medicine

## 2016-04-24 VITALS — BP 136/76 | HR 60 | Ht <= 58 in | Wt 129.4 lb

## 2016-04-24 DIAGNOSIS — N3941 Urge incontinence: Secondary | ICD-10-CM

## 2016-04-24 DIAGNOSIS — E782 Mixed hyperlipidemia: Secondary | ICD-10-CM

## 2016-04-24 DIAGNOSIS — I480 Paroxysmal atrial fibrillation: Secondary | ICD-10-CM | POA: Diagnosis not present

## 2016-04-24 DIAGNOSIS — G4733 Obstructive sleep apnea (adult) (pediatric): Secondary | ICD-10-CM

## 2016-04-24 DIAGNOSIS — R197 Diarrhea, unspecified: Secondary | ICD-10-CM

## 2016-04-24 DIAGNOSIS — E890 Postprocedural hypothyroidism: Secondary | ICD-10-CM

## 2016-04-24 DIAGNOSIS — Z7901 Long term (current) use of anticoagulants: Secondary | ICD-10-CM

## 2016-04-24 DIAGNOSIS — Z780 Asymptomatic menopausal state: Secondary | ICD-10-CM | POA: Diagnosis not present

## 2016-04-24 DIAGNOSIS — M8580 Other specified disorders of bone density and structure, unspecified site: Secondary | ICD-10-CM | POA: Diagnosis not present

## 2016-04-24 DIAGNOSIS — Z0000 Encounter for general adult medical examination without abnormal findings: Secondary | ICD-10-CM | POA: Diagnosis not present

## 2016-04-24 MED ORDER — LEVOTHYROXINE SODIUM 75 MCG PO TABS: 75.0000 ug | ORAL_TABLET | Freq: Every day | ORAL | 1 refills | Status: DC

## 2016-04-24 MED ORDER — IBANDRONATE SODIUM 150 MG PO TABS: ORAL_TABLET | ORAL | 3 refills | Status: DC

## 2016-04-24 NOTE — Patient Instructions (Addendum)
  HEALTH MAINTENANCE RECOMMENDATIONS:  It is recommended that you get at least 30 minutes of aerobic exercise at least 5 days/week (for weight loss, you may need as much as 60-90 minutes). This can be any activity that gets your heart rate up. This can be divided in 10-15 minute intervals if needed, but try and build up your endurance at least once a week.  Weight bearing exercise is also recommended twice weekly.  Eat a healthy diet with lots of vegetables, fruits and fiber.  "Colorful" foods have a lot of vitamins (ie green vegetables, tomatoes, red peppers, etc).  Limit sweet tea, regular sodas and alcoholic beverages, all of which has a lot of calories and sugar.  Up to 1 alcoholic drink daily may be beneficial for women (unless trying to lose weight, watch sugars).  Drink a lot of water.  Calcium recommendations are 1200-1500 mg daily (1500 mg for postmenopausal women or women without ovaries), and vitamin D 1000 IU daily.  This should be obtained from diet and/or supplements (vitamins), and calcium should not be taken all at once, but in divided doses.  Monthly self breast exams and yearly mammograms for women over the age of 81 is recommended.  Sunscreen of at least SPF 30 should be used on all sun-exposed parts of the skin when outside between the hours of 10 am and 4 pm (not just when at beach or pool, but even with exercise, golf, tennis, and yard work!)  Use a sunscreen that says "broad spectrum" so it covers both UVA and UVB rays, and make sure to reapply every 1-2 hours.  Remember to change the batteries in your smoke detectors when changing your clock times in the spring and fall.  Use your seat belt every time you are in a car, and please drive safely and not be distracted with cell phones and texting while driving.   [name] , Thank you for taking time to come for your Medicare Wellness Visit. I appreciate your ongoing commitment to your health goals. Please review the following  plan we discussed and let me know if I can assist you in the future.   These are the goals we discussed: Goals    None      This is a list of the screening recommended for you and due dates:  Health Maintenance  Topic Date Due  . Tetanus Vaccine  01/07/2022  . Flu Shot  Completed  . DEXA scan (bone density measurement)  Completed  . Pneumonia vaccines  Completed   You are due now for another bone density test.  [facility] and schedule [facility]. Continue your yearly mammograms.   I recommend getting the new shingles vaccine (Shingrix) when available. You will need to check with your insurance to see if it is covered, and if covered by Medicare Part D, you need to get from the pharmacy rather than our office.  It is a series of 2 injections, spaced 2 months apart.  Try increasing the fish oil back up to 3 capsules daily.  Your triglycerides were still a little high.  Your LDL cholesterol also jumped up some--try to limit the red meats, cheese, creamy things, etc.    Lab Results  Component Value Date   CHOL 190 04/21/2016   HDL 48 (L) 04/21/2016   LDLCALC 106 (H) 04/21/2016   TRIG 182 (H) 04/21/2016   CHOLHDL 4.0 04/21/2016

## 2016-05-05 ENCOUNTER — Telehealth: Payer: Self-pay | Admitting: *Deleted

## 2016-05-05 ENCOUNTER — Encounter: Payer: Self-pay | Admitting: *Deleted

## 2016-05-05 NOTE — Telephone Encounter (Signed)
Patient advised.

## 2016-05-05 NOTE — Telephone Encounter (Signed)
That dose is okay (3000-4000mg  daily is recommended)

## 2016-05-05 NOTE — Telephone Encounter (Signed)
Patient called and wanted to let you know that when she checked her fish oil she was actually taking 2 1600mg  capsules daily. Is this enough? Do you want her to take more based on last labs? Please advise. Thanks.

## 2016-05-13 DIAGNOSIS — H353131 Nonexudative age-related macular degeneration, bilateral, early dry stage: Secondary | ICD-10-CM | POA: Diagnosis not present

## 2016-05-13 DIAGNOSIS — H40013 Open angle with borderline findings, low risk, bilateral: Secondary | ICD-10-CM | POA: Diagnosis not present

## 2016-05-13 DIAGNOSIS — Z961 Presence of intraocular lens: Secondary | ICD-10-CM | POA: Diagnosis not present

## 2016-05-13 DIAGNOSIS — H0015 Chalazion left lower eyelid: Secondary | ICD-10-CM | POA: Diagnosis not present

## 2016-05-20 ENCOUNTER — Ambulatory Visit
Admission: RE | Admit: 2016-05-20 | Discharge: 2016-05-20 | Disposition: A | Discharge status: Home / Self Care | Payer: Medicare Other | Source: Ambulatory Visit | Attending: Family Medicine | Admitting: Family Medicine

## 2016-05-20 DIAGNOSIS — M8580 Other specified disorders of bone density and structure, unspecified site: Secondary | ICD-10-CM

## 2016-05-20 DIAGNOSIS — M8589 Other specified disorders of bone density and structure, multiple sites: Secondary | ICD-10-CM | POA: Diagnosis not present

## 2016-05-20 DIAGNOSIS — E890 Postprocedural hypothyroidism: Secondary | ICD-10-CM

## 2016-05-20 DIAGNOSIS — Z780 Asymptomatic menopausal state: Secondary | ICD-10-CM | POA: Diagnosis not present

## 2016-06-27 ENCOUNTER — Other Ambulatory Visit: Payer: Self-pay | Admitting: Family Medicine

## 2016-07-16 ENCOUNTER — Encounter: Payer: Self-pay | Admitting: *Deleted

## 2016-07-21 ENCOUNTER — Other Ambulatory Visit: Payer: Self-pay | Admitting: Cardiology

## 2016-08-04 NOTE — Progress Notes (Signed)
Abigail Day Date of Birth: 02/03/1934 Medical Record #[id number]  History of Present Illness: Ms. Mosteller is seen for follow up of Afib. She has a history of paroxysmal atrial fib - on Eliquis. EF is normal per echo back in December of 2013. Normal Myoview in June 2015. Other issues include HLD, hypothyroidism with prior ablation for hyperthyroidism, and diverticulosis. She is  on CPAP therapy for sleep apnea-followed by pulmonary.   On follow up today she is feeling very well. She does note that she is more tired than she would like. Still exercises 3 days a week. HR gets up to around 100.    No SOB or CP. No dizziness.  She is using CPAP. Her Eliquis dose was decreased to 2.5 mg bid due to age >69, and weight < 60 kg.    Current Outpatient Prescriptions on File Prior to Visit  Medication Sig Dispense Refill  . Calcium Carbonate-Vitamin D (CALCIUM 600 + D PO) Take 2 tablets by mouth daily.      . Cholecalciferol (VITAMIN D) 1000 UNITS capsule Take 1,000 Units by mouth daily.      Marland Kitchen co-enzyme Q-10 30 MG capsule Take 30 mg by mouth daily.      Marland Kitchen ELIQUIS 2.5 MG TABS tablet TAKE ONE TABLET BY MOUTH TWICE DAILY 180 tablet 1  . lactase (LACTAID) 3000 UNITS tablet Take 3,000 Units by mouth 3 (three) times daily with meals.    . Lactobacillus (PROBIOTIC ACIDOPHILUS PO) Take 1 tablet by mouth every morning.    Marland Kitchen levothyroxine (SYNTHROID, LEVOTHROID) 75 MCG tablet Take 1 tablet (75 mcg total) by mouth daily. 90 tablet 1  . loratadine (CLARITIN) 10 MG tablet Take 10 mg by mouth daily.    . metoprolol tartrate (LOPRESSOR) 25 MG tablet TAKE 1 TABLET BY MOUTH 2 TIMES A DAY 180 tablet 2  . Misc Natural Products (OSTEO BI-FLEX JOINT SHIELD PO) Take 2 tablets by mouth daily.    . Multiple Vitamin (MULTI-VITAMIN) tablet Take 1 tablet by mouth daily.      . Multiple Vitamins-Minerals (ICAPS PO) Take 1 tablet by mouth daily.    . pravastatin (PRAVACHOL) 40 MG tablet Take 2 tablets (80 mg total) by mouth daily. 180  tablet 3   No current facility-administered medications on file prior to visit.     No Known Allergies  Past Medical History:  Diagnosis Date  . Diverticulosis   . Dyspnea on exertion 02/18/2012  . Eye exam abnormal 05/08/15   Dr.Groat-glaucoma suspect  . Glaucoma suspect   . Hemorrhoids    internal and external  . Hemorrhoids 09/25/10   internal and external  . Hyperplastic colon polyp 09/25/10   Dr. Ewing Schlein  . Osteopenia   . Palpitations 02/18/2012  . Paroxysmal atrial fibrillation (HCC)   . Pure hypercholesterolemia   . Sleep apnea   . Unspecified hypothyroidism    s/p ablation for hyperthyroidism  . Urge urinary incontinence     Past Surgical History:  Procedure Laterality Date  . ABDOMINAL HYSTERECTOMY  age 31   with BSO and bladder tack  . COLONOSCOPY  11/06, 09/25/10, 12/2015   hyperplastic polyp 2012;   . ESOPHAGOGASTRODUODENOSCOPY  09/25/10   tortuous esophagus, intact Nissen fundoplication, dilated the spasm at esophageal GE junction  . EYE SURGERY     bilateral cataract surgery  . LAPAROSCOPIC NISSEN FUNDOPLICATION  8/07   Dr. Daphine Deutscher    History  Smoking Status  . Never Smoker  Smokeless Tobacco  .  Never Used    History  Alcohol Use  . 0.0 oz/week    Comment: once a month or less    Family History  Problem Relation Age of Onset  . Heart disease Mother   . Heart disease Father   . Diabetes Father   . Marfan syndrome Sister   . Heart disease Sister        valve replacement  . Heart disease Brother   . Diabetes Brother   . Heart disease Brother   . Diabetes Brother   . Heart disease Brother   . Heart disease Brother   . Heart disease Brother        CABG  . Heart disease Brother        CABG  . Heart disease Sister        pacemaker  . Aortic aneurysm Sister   . Peripheral Artery Disease Sister        amputation of toes  . Breast cancer Sister [id number]  . Heart disease Sister        CABG at 8380  . Colon cancer Sister 288  . Heart disease Sister         CABG  . Heart disease Brother   . Heart disease Sister   . Hyperlipidemia Sister   . Stroke Sister     Review of Systems: The review of systems is per the HPI.  All other systems were reviewed and are negative.  Physical Exam: BP 132/70   Pulse 60   Ht 4\' 9"  (1.448 m)   Wt 131 lb 9.6 oz (59.7 kg)   BMI 28.48 kg/m  Patient is very pleasant and in no acute distress.  Skin is warm and dry. Color is normal.  HEENT is unremarkable. Normocephalic/atraumatic. PERRL. Sclera are nonicteric. Neck is supple. No masses. No JVD. Lungs are clear. Cardiac exam shows a regular rate and rhythm. No gallop or murmur. Abdomen is soft. Extremities are without edema. Gait and ROM are intact. No gross neurologic deficits noted.  LABORATORY DATA:     Lab Results  Component Value Date   WBC 2.9 (L) 04/21/2016   HGB 13.5 04/21/2016   HCT 40.3 04/21/2016   PLT 209 04/21/2016   GLUCOSE 90 04/21/2016   CHOL 190 04/21/2016   TRIG 182 (H) 04/21/2016   HDL 48 (L) 04/21/2016   LDLCALC 106 (H) 04/21/2016   ALT 16 04/21/2016   AST 21 04/21/2016   NA 142 04/21/2016   K 4.4 04/21/2016   CL 105 04/21/2016   CREATININE 0.83 04/21/2016   BUN 15 04/21/2016   CO2 29 04/21/2016   TSH 0.72 09/24/2015   HGBA1C 5.4 09/24/2015   Ecg  Today- NSR with normal Ecg. I have personally reviewed and interpreted this study.   Assessment / Plan:  1. Obstructive sleep apnea. On CPAP therapy  2. PAF - on beta blocker and Eliquis - she is in sinus by exam today. Rare episodes of Afib. Will continue with current rate control strategy. Encouraged her to continue with exercise program to maintain fitness.   3. Hypercholesterolemia. Most recent lipid panel showed an increase in LDL to 106 from mid 70s. No change in therapy or diet. She did not tolerate lipitor in the past. She is close to goal of LDL 100. I would continue pravastatin and follow for now.  Follow up in 6 months.

## 2016-08-06 ENCOUNTER — Ambulatory Visit (INDEPENDENT_AMBULATORY_CARE_PROVIDER_SITE_OTHER): Payer: Medicare Other | Admitting: Cardiology

## 2016-08-06 VITALS — BP 132/70 | HR 60 | Ht <= 58 in | Wt 131.6 lb

## 2016-08-06 DIAGNOSIS — E782 Mixed hyperlipidemia: Secondary | ICD-10-CM

## 2016-08-06 DIAGNOSIS — I480 Paroxysmal atrial fibrillation: Secondary | ICD-10-CM | POA: Diagnosis not present

## 2016-08-06 NOTE — Patient Instructions (Addendum)
Continue your current therapy  I will see you in 6 months.   

## 2016-09-30 ENCOUNTER — Telehealth: Payer: Self-pay

## 2016-09-30 ENCOUNTER — Other Ambulatory Visit: Payer: Self-pay | Admitting: Family Medicine

## 2016-09-30 NOTE — Telephone Encounter (Signed)
I think this was originally done for cost purposes (the 40mg  was on the $4 list of meds).  If it is less expensive for 80mg  tablet, I suspect patient wouldn't mind the change--please look into this (cost, and if pt wants to switch)

## 2016-09-30 NOTE — Telephone Encounter (Signed)
[facility] insurance company wants pt to take 80mg  of pravastatin instead of 2-40mg  tablets. Please advise if change is appropriate?  Thank you, Lurena JoinerRebecca

## 2016-10-01 NOTE — Telephone Encounter (Signed)
Spoke with pharmacist at [facility]- she reports pravastatin is no longer on the $4.00 list and it will be $15.00 for a month supply of the 40mg  tablets, whereas the 80mg  tablets are $28.00/ month. Will leave script as 2-40mg  tablets. Pt notified. Trixie Rude/RLB

## 2016-10-20 ENCOUNTER — Other Ambulatory Visit: Payer: Medicare Other

## 2016-10-20 DIAGNOSIS — E890 Postprocedural hypothyroidism: Secondary | ICD-10-CM

## 2016-10-20 DIAGNOSIS — E782 Mixed hyperlipidemia: Secondary | ICD-10-CM | POA: Diagnosis not present

## 2016-10-20 DIAGNOSIS — Z7901 Long term (current) use of anticoagulants: Secondary | ICD-10-CM

## 2016-10-20 LAB — CBC WITH DIFFERENTIAL/PLATELET
Basophils Absolute: 31 cells/uL (ref 0–200)
Basophils Relative: 1 %
Eosinophils Absolute: 93 cells/uL (ref 15–500)
Eosinophils Relative: 3 %
HEMATOCRIT: 40.1 % (ref 35.0–45.0)
Hemoglobin: 13.4 g/dL (ref 11.7–15.5)
Lymphocytes Relative: 36 %
Lymphs Abs: 1116 cells/uL (ref 850–3900)
MCH: 33.8 pg — ABNORMAL HIGH (ref 27.0–33.0)
MCHC: 33.4 g/dL (ref 32.0–36.0)
MCV: 101 fL — ABNORMAL HIGH (ref 80.0–100.0)
MONO ABS: 341 {cells}/uL (ref 200–950)
MONOS PCT: 11 %
MPV: 9.7 fL (ref 7.5–12.5)
NEUTROS PCT: 49 %
Neutro Abs: 1519 cells/uL (ref 1500–7800)
PLATELETS: 216 10*3/uL (ref 140–400)
RBC: 3.97 MIL/uL (ref 3.80–5.10)
RDW: 12.9 % (ref 11.0–15.0)
WBC: 3.1 10*3/uL — AB (ref 4.0–10.5)

## 2016-10-20 LAB — TSH: TSH: 1.38 mIU/L

## 2016-10-21 LAB — LIPID PANEL
CHOLESTEROL: 183 mg/dL (ref <200)
HDL: 44 mg/dL — ABNORMAL LOW (ref >50)
LDL CALC: 93 mg/dL (ref <100)
Total CHOL/HDL Ratio: 4.2 Ratio (ref <5.0)
Triglycerides: 229 mg/dL — ABNORMAL HIGH (ref <150)
VLDL: 46 mg/dL — AB (ref <30)

## 2016-10-21 LAB — HEPATIC FUNCTION PANEL
ALT: 21 U/L (ref 6–29)
AST: 24 U/L (ref 10–35)
Albumin: 4.3 g/dL (ref 3.6–5.1)
Alkaline Phosphatase: 59 U/L (ref 33–130)
BILIRUBIN INDIRECT: 0.3 mg/dL (ref 0.2–1.2)
Bilirubin, Direct: 0.1 mg/dL (ref <=0.2)
Total Bilirubin: 0.4 mg/dL (ref 0.2–1.2)
Total Protein: 6.2 g/dL (ref 6.1–8.1)

## 2016-10-22 NOTE — Progress Notes (Signed)
Chief Complaint  Patient presents with  . Hyperlipidemia    nonfasting med check.   Hypothyroidism: Denies changes in weight, energy, skin, nails, moods. Bowels unchanged. Had colonoscopy in October; random biopsy was normal. Taking probiotic daily. Dr. Watt Climes recommended continuing the probiotics for at least 6 months.  Bowels are still slightly loose, but much better than before. Uses Lactaid prn before dairy with dinner.  Mixed hyperlipidemia: Last check in February was borderline--she increased her fish oil from 2 to 3/day, as directed, and is compliant with taking pravastatin. Compliant with medications and denies medication side effects. Per Dr. Martinique, goal LDL <100; she previously didn't tolerate lipitor. She previously described some "crawling" discomfort in her legs, mainly when they are cold, which she reports is the same.She also describedaching in her legs which feels better when she wears compression stockings.  She has some stinging in the bottom of her feet at the end of the day--when she wears the compression socks, feel rough, and moisturizing at night helps.  Doesn't eat dessert at night (from dining room). +sweets at home. Some fried foods in the dining room.  Always has the option for baked chicken. Mostly fish, shrimp, chicken.  Only drinks water, small juice glass of cranberry juice in the mornings only. Has chips with sandwich for lunch.  Atrial fibrillation: She remains on blood thinners without any bleeding or complications. She sees Dr. Martinique every 6 months, last in June.  Eliquis dose was decreased to 2.5 mg bid due to age >82, and weight < 60 kg. She denies tachycardia, no known episodes of atrial fibrillation (she may feel like she is in atrial fibrillation periodically, a few times/week, short-lived). She denies chest pain, shortness of breath. Sometimes slightly winded after walking up 3 flights of stairs--unchanged.  HTN: Occasionally checks BP, runs  120-130/60's-70's. No headaches, dizziness, chest pain. She needs to get up slowly in the morning, to avoid dizziness. Sometimes has a slight nagging headache when she first wakes up, across her forehead. She sleeps with CPAP. She feels somewhat tired in the afternoons.  Sometimes feels worn out after bathing. Rarely feels a little dizzy when laying flat, somewhat off balanced.  Denies nasal congestion/allergies.  OSA: She is using CPAP, and doing well. Switched from nasal to a mask, and feels better. She last saw Dr. Halford Chessman in September. She feels more refreshed since being on CPAP.   Osteopenia--She restarted Boniva after her last bone density 04/2014.  Denies any reflux, chest pain or other side effects.Last DEXA was 05/2016, T-1.6 in L fem neck, not significantly changed from 04/2014:  At one time she didn't take the [facility] for a month, held it to see if Jaclyn Prime was contributing to her bony complaints.  She didn't notice a difference, so resumed taking it.   Admits that she hasn't been as compliant with taking the Boniva, hasn't taken it for about 4 months.  She didn't feel like it was helping, since the last DEXA looked the same. Not stopped due to side effects or problems.  Allergies--she has been taking a daily antihistamine which has improved her postnasal drainage and allergy symptoms. She gets some hoarseness in the afternoons, sometimes even in the morning.  She has started taking the claritin twice daily due to these symptoms; has not noticed it helping.  She uses a nasal saline at night and in morning.  Urinary frequency.She took physical therapy in the past, doing Kegel's sporadically.  Some nights are worse than others. It  was better after PT when doing the exercises regularly.  Has some intermittent discomfort at her right flank.  She restarted the cranberry tablet over the last 2 weeks (had stopped); her urinary frequency improved.  Only has discomfort with moving, bending, showering.  Heating pad helps a lot.   PMH, PSH, SH reviewed  Outpatient Encounter Prescriptions as of 10/23/2016  Medication Sig Note  . Calcium Carbonate-Vitamin D (CALCIUM 600 + D PO) Take 2 tablets by mouth daily.     . Cholecalciferol (VITAMIN D) 1000 UNITS capsule Take 1,000 Units by mouth daily.     Marland Kitchen co-enzyme Q-10 30 MG capsule Take 30 mg by mouth daily.     Marland Kitchen ELIQUIS 2.5 MG TABS tablet TAKE ONE TABLET BY MOUTH TWICE DAILY   . lactase (LACTAID) 3000 UNITS tablet Take 3,000 Units by mouth 3 (three) times daily with meals.   . Lactobacillus (PROBIOTIC ACIDOPHILUS PO) Take 1 tablet by mouth every morning.   Marland Kitchen levothyroxine (SYNTHROID, LEVOTHROID) 75 MCG tablet Take 1 tablet (75 mcg total) by mouth daily.   Marland Kitchen loratadine (CLARITIN) 10 MG tablet Take 10 mg by mouth daily. 10/23/2016: Taking 2 daily (BID)  . metoprolol tartrate (LOPRESSOR) 25 MG tablet TAKE 1 TABLET BY MOUTH 2 TIMES A DAY   . Misc Natural Products (OSTEO BI-FLEX JOINT SHIELD PO) Take 2 tablets by mouth daily.   . Multiple Vitamin (MULTI-VITAMIN) tablet Take 1 tablet by mouth daily.     . Multiple Vitamins-Minerals (ICAPS PO) Take 1 tablet by mouth daily.   . Omega-3 Fatty Acids (FISH OIL OMEGA-3 PO) Take 4,800 mg by mouth daily.   . [DISCONTINUED] levothyroxine (SYNTHROID, LEVOTHROID) 75 MCG tablet Take 1 tablet (75 mcg total) by mouth daily.   . [DISCONTINUED] pravastatin (PRAVACHOL) 40 MG tablet Take 2 tablets (80 mg total) by mouth daily.   . fluticasone (FLONASE) 50 MCG/ACT nasal spray Place 2 sprays into both nostrils daily.   . rosuvastatin (CRESTOR) 20 MG tablet Take 1 tablet (20 mg total) by mouth daily.   . [DISCONTINUED] pravastatin (PRAVACHOL) 40 MG tablet TAKE 2 TABLETS BY MOUTH ONCE DAILY    No facility-administered encounter medications on file as of 10/23/2016.    (was taking pravastatin prior to visit, not rosuvastatin; flonase started today)  No Known Allergies   ROS:  See HPI. +hoarseness/allergies/PND, some  urinary frequency, muscular flank pain on right, burning discomfort in bottom of her feet per HPI.  Denies fever, chills, chest pain, shortness of breath, GI complaints, depression/anxiety, joint pains or other concerns, except as noted in HPI.   PHYSICAL EXAM:  BP 110/68 (BP Location: Right Arm, Patient Position: Sitting, Cuff Size: Normal)   Pulse 68   Ht 4' 9.5" (1.461 m)   Wt 130 lb 12.8 oz (59.3 kg)   BMI 27.81 kg/m    Wt Readings from Last 3 Encounters:  10/23/16 130 lb 12.8 oz (59.3 kg)  08/06/16 131 lb 9.6 oz (59.7 kg)  04/24/16 129 lb 6.4 oz (58.7 kg)    Well developed, pleasant female in no distress HEENT: PERRL, EOMI, conjunctiva clear, OP clear. Nasal mucosa is mild-mod edematous, no erythema, drainage, purulence. Sinuses nontender Neck: no lymphadenopathy or thyromegaly, no carotid bruit Heart: regular rate and rhythm, no murmur, rub, gallop Lungs: clear bilaterally Abdomen: soft, nontender, no organomegaly or mass. Back: no spinal or CVA tenderness.  Area of her intermittent discomfort is R flank and paraspinous muscles Extremities: no edema, normal pulses. 2nd and 3rd toes  bilaterally are fused proximally, (separated only at the distal phalanx).  Skin: no rashes or bruising Neuro: alert and oriented, cranial nerves intact, normal strength, gait Psych: Normal mood, affect, hygiene and grooming    Lab Results  Component Value Date   WBC 3.1 (L) 10/20/2016   HGB 13.4 10/20/2016   HCT 40.1 10/20/2016   MCV 101.0 (H) 10/20/2016   PLT 216 10/20/2016   Lab Results  Component Value Date   ALT 21 10/20/2016   AST 24 10/20/2016   ALKPHOS 59 10/20/2016   BILITOT 0.4 10/20/2016   Lab Results  Component Value Date   CHOL 183 10/20/2016   HDL 44 (L) 10/20/2016   LDLCALC 93 10/20/2016   TRIG 229 (H) 10/20/2016   CHOLHDL 4.2 10/20/2016    Lab Results  Component Value Date   TSH 1.38 10/20/2016    ASSESSMENT/PLAN:  Mixed hyperlipidemia - TG remains  elevated; Try switch from pravastatin to Crestor, continue fish oil; lowfat diet reviewed, suggestions made - Plan: rosuvastatin (CRESTOR) 20 MG tablet  Paroxysmal atrial fibrillation (HCC) - in sinus rhythm, anticoagulated without complications  Anticoagulant long-term use  Postablative hypothyroidism - adequately replaced - Plan: levothyroxine (SYNTHROID, LEVOTHROID) 75 MCG tablet  Obstructive sleep apnea - compliant with CPAP use  Allergic rhinitis, unspecified seasonality, unspecified trigger - instructed on proper Flonase use; cut claritin back to once daily - Plan: fluticasone (FLONASE) 50 MCG/ACT nasal spray  Need for influenza vaccination - Plan: Flu vaccine HIGH DOSE PF (Fluzone High dose)  Osteopenia, unspecified location - noncompliant with bisphosphonate. Encouraged her to restart, discussed why. Recommend use x 5 years before stopping unless contraindication   6 mos AWV/med check with fasting labs prior (not entering orders now so they aren't released in error when she comes in 3 mos.  Will need CBC, at least b-met--need to determine if LFT's/lipids need to be repeated after seeing the ones in 3 mos). Fasting labs in 3 mos (lab visit only, change to OV f/u if needed)   40 min visit, more than 1/2 spent counseling (diet, medications)    Please restart the Boniva (monthly medication for your bones). The goal is (as long as you don't have any side effects or contraindications) to stay on this medication for 5 years.  Please take it once every month.  Try and cut back on snacks/sweets/chips at home.  Snack more on fruits/vegetables rather than chips or cookies.  Continue the 3 fish oil daily.  Try and choose the baked chicken option if the only other option is fried at the dining room.  Try and do your Kegel's exercises daily--this will help your urinary symptoms (in addition to limiting your fluids, emptying frequently, etc).   Only take the loratidine (allergy medicine)  once daily (not twice). Start taking flonase 2 sprays each nostril (gentle sniffs only) once daily.  We are switching your cholesterol medication from the pravastatin to rosuvastatin.  Please let us know if you have any side effects or problems with the new medication.  It is a strong medication that is more effective than the pravastatin, so lower doses can be used. Continue your coenzyme Q10  Schedule 3 month fasting lab visit to see how the Crestor is working.

## 2016-10-23 ENCOUNTER — Ambulatory Visit (INDEPENDENT_AMBULATORY_CARE_PROVIDER_SITE_OTHER): Payer: Medicare Other | Admitting: Family Medicine

## 2016-10-23 ENCOUNTER — Encounter: Payer: Self-pay | Admitting: Family Medicine

## 2016-10-23 VITALS — BP 110/68 | HR 68 | Ht <= 58 in | Wt 130.8 lb

## 2016-10-23 DIAGNOSIS — I480 Paroxysmal atrial fibrillation: Secondary | ICD-10-CM

## 2016-10-23 DIAGNOSIS — G4733 Obstructive sleep apnea (adult) (pediatric): Secondary | ICD-10-CM

## 2016-10-23 DIAGNOSIS — J309 Allergic rhinitis, unspecified: Secondary | ICD-10-CM

## 2016-10-23 DIAGNOSIS — E782 Mixed hyperlipidemia: Secondary | ICD-10-CM | POA: Diagnosis not present

## 2016-10-23 DIAGNOSIS — M8580 Other specified disorders of bone density and structure, unspecified site: Secondary | ICD-10-CM | POA: Diagnosis not present

## 2016-10-23 DIAGNOSIS — Z23 Encounter for immunization: Secondary | ICD-10-CM | POA: Diagnosis not present

## 2016-10-23 DIAGNOSIS — E890 Postprocedural hypothyroidism: Secondary | ICD-10-CM

## 2016-10-23 DIAGNOSIS — Z7901 Long term (current) use of anticoagulants: Secondary | ICD-10-CM

## 2016-10-23 MED ORDER — FLUTICASONE PROPIONATE 50 MCG/ACT NA SUSP: 2.0000 | Freq: Every day | NASAL | 6 refills | Status: DC

## 2016-10-23 MED ORDER — LEVOTHYROXINE SODIUM 75 MCG PO TABS: 75.0000 ug | ORAL_TABLET | Freq: Every day | ORAL | 1 refills | Status: DC

## 2016-10-23 MED ORDER — ROSUVASTATIN CALCIUM 20 MG PO TABS: 20.0000 mg | ORAL_TABLET | Freq: Every day | ORAL | 2 refills | Status: DC

## 2016-10-23 NOTE — Patient Instructions (Addendum)
  I recommend getting the new shingles vaccine (Shingrix). You will need to check with your insurance to see if it is covered, and if covered by Medicare Part D, you need to get from the pharmacy rather than our office.  It is a series of 2 injections, spaced 2 months apart. The second dose needs to be given within 6 months of the firs.t  There is currently a manufacturer's backorder--hold off on starting this series until you either see commercials on TV, or until 2019 when the supply should be better.   Please restart the Boniva (monthly medication for your bones). The goal is (as long as you don't have any side effects or contraindications) to stay on this medication for 5 years.  Please take it once every month.  Try and cut back on snacks/sweets/chips at home.  Snack more on fruits/vegetables rather than chips or cookies.  Continue the 3 fish oil daily.  Try and choose the baked chicken option if the only other option is fried at the dining room.  Try and do your Kegel's exercises daily--this will help your urinary symptoms (in addition to limiting your fluids, emptying frequently, etc).   Only take the loratidine (allergy medicine) once daily (not twice). Start taking flonase (generic fluticasone) 2 sprays each nostril (gentle sniffs only) once daily.  We are switching your cholesterol medication from the pravastatin to rosuvastatin.  Please let us know if you have any side effects or problems with the new medication.  It is a strong medication that is more effective than the pravastatin, so lower doses can be used. Continue your coenzyme Q10  Schedule 3 month fasting lab visit to see how the Crestor is working.

## 2016-11-17 ENCOUNTER — Ambulatory Visit (INDEPENDENT_AMBULATORY_CARE_PROVIDER_SITE_OTHER): Payer: Medicare Other | Admitting: Pulmonary Disease

## 2016-11-17 ENCOUNTER — Encounter: Payer: Self-pay | Admitting: Pulmonary Disease

## 2016-11-17 VITALS — BP 116/70 | HR 60 | Ht <= 58 in | Wt 130.6 lb

## 2016-11-17 DIAGNOSIS — G4733 Obstructive sleep apnea (adult) (pediatric): Secondary | ICD-10-CM | POA: Diagnosis not present

## 2016-11-17 DIAGNOSIS — J310 Chronic rhinitis: Secondary | ICD-10-CM

## 2016-11-17 DIAGNOSIS — Z9989 Dependence on other enabling machines and devices: Secondary | ICD-10-CM

## 2016-11-17 NOTE — Progress Notes (Signed)
Current Outpatient Prescriptions on File Prior to Visit  Medication Sig  . Calcium Carbonate-Vitamin D (CALCIUM 600 + D PO) Take 2 tablets by mouth daily.    . Cholecalciferol (VITAMIN D) 1000 UNITS capsule Take 1,000 Units by mouth daily.    Marland Kitchen co-enzyme Q-10 30 MG capsule Take 30 mg by mouth daily.    Marland Kitchen ELIQUIS 2.5 MG TABS tablet TAKE ONE TABLET BY MOUTH TWICE DAILY  . fluticasone (FLONASE) 50 MCG/ACT nasal spray Place 2 sprays into both nostrils daily.  Marland Kitchen lactase (LACTAID) 3000 UNITS tablet Take 3,000 Units by mouth 3 (three) times daily with meals.  . Lactobacillus (PROBIOTIC ACIDOPHILUS PO) Take 1 tablet by mouth every morning.  Marland Kitchen levothyroxine (SYNTHROID, LEVOTHROID) 75 MCG tablet Take 1 tablet (75 mcg total) by mouth daily.  Marland Kitchen loratadine (CLARITIN) 10 MG tablet Take 10 mg by mouth daily.  . metoprolol tartrate (LOPRESSOR) 25 MG tablet TAKE 1 TABLET BY MOUTH 2 TIMES A DAY  . Misc Natural Products (OSTEO BI-FLEX JOINT SHIELD PO) Take 2 tablets by mouth daily.  . Multiple Vitamin (MULTI-VITAMIN) tablet Take 1 tablet by mouth daily.    . Multiple Vitamins-Minerals (ICAPS PO) Take 1 tablet by mouth daily.  . Omega-3 Fatty Acids (FISH OIL OMEGA-3 PO) Take 4,800 mg by mouth daily.  . rosuvastatin (CRESTOR) 20 MG tablet Take 1 tablet (20 mg total) by mouth daily.   No current facility-administered medications on file prior to visit.      Chief Complaint  Patient presents with  . Follow-up    Pt doing well overall. Pt using AirFit nasal pillow and doing much better with it.    Sleep tests PSG 08/18/12 >> AHI 23, SpO2 low 81% Auto CPAP 10/19/16 to 11/17/16 >> used on 29 of 30 nights with average 7 hrs 1 min.  Average AHI 2.5 with median CPAP 10 and 95 th percentile CPAP 13 cm H2O  Past medical history PAF, HLD, Osteopenia, Diverticulosis, Hypothyroidism  Past surgical history, Family history, Social history, Allergies reviewed  Vital Signs BP 116/70 (BP Location: Left Arm, Cuff Size:  Normal)   Pulse 60   Ht  (1.473 m)   Wt 130 lb 9.6 oz (59.2 kg)   SpO2 100%   BMI 27.30 kg/m   History of Present Illness Abigail Day is a 83 y.o. female with OSA.  She uses CPAP nightly  Mask fits better, but still gets irritation in her nares at times.  She has been getting nasal congestion and post nasal drip, this causes hoarseness at times.  She has been using nasal pillows CPAP mask.  She denies cough, chest pain, or palpitations.   Physical Exam  General - pleasant Eyes - pupils reactive ENT - no sinus tenderness, no oral exudate, no LAN Cardiac - regular, no murmur Chest - no wheeze, rales Abd - soft, non tender Ext - no edema Skin - no rashes Neuro - normal strength Psych - normal mood   Assessment/Plan  Obstructive sleep apnea. - she is compliant with CPAP and reports benefit from therapy - continue auto CPAP - she should be eligible for a new CPAP machine in 2019  CPAP rhinitis. - advised her to use nasal irrigation and flonase - discussed how to adjust her CPAP humidifier - discussed how to properly fit her CPAP mask   Patient Instructions  Follow up in 1 year    Coralyn Helling, MD [facility] Pager:  [phone] 11/17/2016, 1:55 PM

## 2016-11-17 NOTE — Patient Instructions (Signed)
Follow up in 1 year.

## 2016-12-15 ENCOUNTER — Ambulatory Visit (INDEPENDENT_AMBULATORY_CARE_PROVIDER_SITE_OTHER): Payer: Medicare Other | Admitting: Family Medicine

## 2016-12-15 ENCOUNTER — Encounter: Payer: Self-pay | Admitting: Family Medicine

## 2016-12-15 VITALS — BP 114/70 | HR 68 | Temp 99.2°F | Ht <= 58 in | Wt 127.2 lb

## 2016-12-15 DIAGNOSIS — R197 Diarrhea, unspecified: Secondary | ICD-10-CM | POA: Diagnosis not present

## 2016-12-15 NOTE — Patient Instructions (Addendum)
It is hard to say exactly what is causing your diarrhea.  It may be viral, or it could possibly be an early bacterial infection (diverticulitis).  Let's be very careful over the next 1-2 days and stay in touch with how you're doing.  At this point, let's just treat conservatively--bland diet (read below), staying well hydrated (drink lots of water).  Please avoid cheese and all dairy for at least 5 days after your diarrhea resolves. Continue to take the probiotic daily. Be sure to use Lactaid pill prior to dairy once you resume dairy in your diet.  Return if you develop high fever, blood in your stool, vomiting, worsening abdominal pain, or other concerns.  Monitor for any fevers. Call us back if your pain is worsening or diarrhea persists, and we can start you on an antibiotic.    Bland Diet A bland diet consists of foods that do not have a lot of fat or fiber. Foods without fat or fiber are easier for the body to digest. They are also less likely to irritate your mouth, throat, stomach, and other parts of your gastrointestinal tract. A bland diet is sometimes called a BRAT diet. What is my plan? Your health care provider or dietitian may recommend specific changes to your diet to prevent and treat your symptoms, such as:  Eating small meals often.  Cooking food until it is soft enough to chew easily.  Chewing your food well.  Drinking fluids slowly.  Not eating foods that are very spicy, sour, or fatty.  Not eating citrus fruits, such as oranges and grapefruit.  What do I need to know about this diet?  Eat a variety of foods from the bland diet food list.  Do not follow a bland diet longer than you have to.  Ask your health care provider whether you should take vitamins. What foods can I eat? Grains  Hot cereals, such as cream of wheat. Bread, crackers, or tortillas made from refined white flour. Rice. Vegetables Canned or cooked vegetables. Mashed or boiled  potatoes. Fruits Bananas. Applesauce. Other types of cooked or canned fruit with the skin and seeds removed, such as canned peaches or pears. Meats and Other Protein Sources Scrambled eggs. Creamy peanut butter or other nut butters. Lean, well-cooked meats, such as chicken or fish. Tofu. Soups or broths. Dairy Low-fat dairy products, such as milk, cottage cheese, or yogurt. Beverages Water. Herbal tea. Apple juice. Sweets and Desserts Pudding. Custard. Fruit gelatin. Ice cream. Fats and Oils Mild salad dressings. Canola or olive oil. The items listed above may not be a complete list of allowed foods or beverages. Contact your dietitian for more options. What foods are not recommended? Foods and ingredients that are often not recommended include:  Spicy foods, such as hot sauce or salsa.  Fried foods.  Sour foods, such as pickled or fermented foods.  Raw vegetables or fruits, especially citrus or berries.  Caffeinated drinks.  Alcohol.  Strongly flavored seasonings or condiments.   Food Choices to Help Relieve Diarrhea, Adult When you have diarrhea, the foods you eat and your eating habits are very important. Choosing the right foods and drinks can help:  Relieve diarrhea.  Replace lost fluids and nutrients.  Prevent dehydration.  What general guidelines should I follow? Relieving diarrhea  Choose foods with less than 2 g or .07 oz. of fiber per serving.  Limit fats to less than 8 tsp (38 g or 1.34 oz.) a day.  Avoid the following: ?  Foods and beverages sweetened with high-fructose corn syrup, honey, or sugar alcohols such as xylitol, sorbitol, and mannitol. ? Foods that contain a lot of fat or sugar. ? Fried, greasy, or spicy foods. ? High-fiber grains, breads, and cereals. ? Raw fruits and vegetables.  Eat foods that are rich in probiotics. These foods include dairy products such as yogurt and fermented milk products. They help increase healthy bacteria in the  stomach and intestines (gastrointestinal tract, or GI tract).  If you have lactose intolerance, avoid dairy products. These may make your diarrhea worse.  Take medicine to help stop diarrhea (antidiarrheal medicine) only as told by your health care provider. Replacing nutrients  Eat small meals or snacks every 3-4 hours.  Eat bland foods, such as white rice, toast, or baked potato, until your diarrhea starts to get better. Gradually reintroduce nutrient-rich foods as tolerated or as told by your health care provider. This includes: ? Well-cooked protein foods. ? Peeled, seeded, and soft-cooked fruits and vegetables. ? Low-fat dairy products.  Take vitamin and mineral supplements as told by your health care provider. Preventing dehydration   Start by sipping water or a special solution to prevent dehydration (oral rehydration solution, ORS). Urine that is clear or pale yellow means that you are getting enough fluid.  Try to drink at least 8-10 cups of fluid each day to help replace lost fluids.  You may add other liquids in addition to water, such as clear juice or decaffeinated sports drinks, as tolerated or as told by your health care provider.  Avoid drinks with caffeine, such as coffee, tea, or soft drinks.  Avoid alcohol. What foods are recommended? The items listed may not be a complete list. Talk with your health care provider about what dietary choices are best for you. Grains White rice. White, [place], or pita breads (fresh or toasted), including plain rolls, buns, or bagels. White pasta. Saltine, soda, or graham crackers. Pretzels. Low-fiber cereal. Cooked cereals made with water (such as cornmeal, farina, or cream cereals). Plain muffins. Matzo. Melba toast. Zwieback. Vegetables Potatoes (without the skin). Most well-cooked and canned vegetables without skins or seeds. Tender lettuce. Fruits Apple sauce. Fruits canned in juice. Cooked apricots, cherries, grapefruit,  peaches, pears, or plums. Fresh bananas and cantaloupe. Meats and other protein foods Baked or boiled chicken. Eggs. Tofu. Fish. Seafood. Smooth nut butters. Ground or well-cooked tender beef, ham, veal, lamb, pork, or poultry. Dairy Plain yogurt, kefir, and unsweetened liquid yogurt. Lactose-free milk, buttermilk, skim milk, or soy milk. Low-fat or nonfat hard cheese. Beverages Water. Low-calorie sports drinks. Fruit juices without pulp. Strained tomato and vegetable juices. Decaffeinated teas. Sugar-free beverages not sweetened with sugar alcohols. Oral rehydration solutions, if approved by your health care provider. Seasoning and other foods Bouillon, broth, or soups made from recommended foods. What foods are not recommended? The items listed may not be a complete list. Talk with your health care provider about what dietary choices are best for you. Grains Whole grain, whole wheat, bran, or rye breads, rolls, pastas, and crackers. Wild or brown rice. Whole grain or bran cereals. Barley. Oats and oatmeal. Corn tortillas or taco shells. Granola. Popcorn. Vegetables Raw vegetables. Fried vegetables. Cabbage, broccoli, Brussels sprouts, artichokes, baked beans, beet greens, corn, kale, legumes, peas, sweet potatoes, and yams. Potato skins. Cooked spinach and cabbage. Fruits Dried fruit, including raisins and dates. Raw fruits. Stewed or dried prunes. Canned fruits with syrup. Meat and other protein foods Fried or fatty meats. Deli meats. [employer]  nut butters. Nuts and seeds. Beans and lentils. Tomasa Blase. Hot dogs. Sausage. Dairy High-fat cheeses. Whole milk, chocolate milk, and beverages made with milk, such as milk shakes. Half-and-half. Cream. sour cream. Ice cream. Beverages Caffeinated beverages (such as coffee, tea, soda, or energy drinks). Alcoholic beverages. Fruit juices with pulp. Prune juice. Soft drinks sweetened with high-fructose corn syrup or sugar alcohols. High-calorie sports  drinks. Fats and oils Butter. Cream sauces. Margarine. Salad oils. Plain salad dressings. Olives. Avocados. Mayonnaise. Sweets and desserts Sweet rolls, doughnuts, and sweet breads. Sugar-free desserts sweetened with sugar alcohols such as xylitol and sorbitol. Seasoning and other foods Honey. Hot sauce. Chili powder. Gravy. Cream-based or milk-based soups. Pancakes and waffles. Summary  When you have diarrhea, the foods you eat and your eating habits are very important.  Make sure you get at least 8-10 cups of fluid each day, or enough to keep your urine clear or pale yellow.  Eat bland foods and gradually reintroduce healthy, nutrient-rich foods as tolerated, or as told by your health care provider.  Avoid high-fiber, fried, greasy, or spicy foods. This information is not intended to replace advice given to you by your health care provider. Make sure you discuss any questions you have with your health care provider. Document Released: 05/10/2003 Document Revised: 02/15/2016 Document Reviewed: 02/15/2016 Elsevier Interactive Patient Education  2017 [employer].

## 2016-12-15 NOTE — Progress Notes (Signed)
Chief Complaint  Patient presents with  . Diarrhea    started Wed but was just one time. Thursday and Friday all day. Started taking Immodium Friday and has taken 12 doses and it has not helped. Still has had the diarrhea all weekend- hasn't had any today so far yet.    She has had diarrhea x 5 days.  Only 1 episode on the first day, Wednesday (not too unusual for her), but it has been nonstop since Thursday.  She even woke up at 1 am this morning with diarrhea.  She took imodium then, and has been okay since then.  Stool is yellow, very watery (not complete liquid).  No blood in the stool, possibly some mucus.  She has some discomfort at her left side of her abdomen, feels like there is a knot that is sore.  She noticed this after she started having frequent diarrhea on Thursday, only notices it when she stands.  No fever, chills, nausea, vomiting.  She has known diverticulosis.  Denies h/o diverticulitis. No known sick contacts, husband eats all the same food as her.    No recent antibiotics, no travel. No change in diet. She had a grilled cheese sandwich for lunch yesterday.  She continues to take a probiotic daily.  She usually takes lactaid before eating creamy foods, but did NOT take one before grilled cheese sandwich.  PMH, PSH, SH reviewed  Outpatient Encounter Prescriptions as of 12/15/2016  Medication Sig Note  . Calcium Carbonate-Vitamin D (CALCIUM 600 + D PO) Take 2 tablets by mouth daily.     . Cholecalciferol (VITAMIN D) 1000 UNITS capsule Take 1,000 Units by mouth daily.     Marland Kitchen co-enzyme Q-10 30 MG capsule Take 30 mg by mouth daily.     Marland Kitchen ELIQUIS 2.5 MG TABS tablet TAKE ONE TABLET BY MOUTH TWICE DAILY   . lactase (LACTAID) 3000 UNITS tablet Take 3,000 Units by mouth 3 (three) times daily with meals.   . Lactobacillus (PROBIOTIC ACIDOPHILUS PO) Take 1 tablet by mouth every morning.   Marland Kitchen levothyroxine (SYNTHROID, LEVOTHROID) 75 MCG tablet Take 1 tablet (75 mcg total) by mouth  daily.   . Loperamide HCl (IMODIUM PO) Take 1 tablet by mouth as needed.   . loratadine (CLARITIN) 10 MG tablet Take 10 mg by mouth daily. 10/23/2016: Taking 2 daily (BID)  . metoprolol tartrate (LOPRESSOR) 25 MG tablet TAKE 1 TABLET BY MOUTH 2 TIMES A DAY   . Misc Natural Products (OSTEO BI-FLEX JOINT SHIELD PO) Take 2 tablets by mouth daily.   . Multiple Vitamin (MULTI-VITAMIN) tablet Take 1 tablet by mouth daily.     . Multiple Vitamins-Minerals (ICAPS PO) Take 1 tablet by mouth daily.   . Omega-3 Fatty Acids (FISH OIL OMEGA-3 PO) Take 4,800 mg by mouth daily.   . rosuvastatin (CRESTOR) 20 MG tablet Take 1 tablet (20 mg total) by mouth daily.   . fluticasone (FLONASE) 50 MCG/ACT nasal spray Place 2 sprays into both nostrils daily. (Patient not taking: Reported on 12/15/2016)    No facility-administered encounter medications on file as of 12/15/2016.    No Known Allergies  ROS:  No fever, nausea or vomiting.  Slight decreased appetite. No headaches, dizziness, URI symptoms, cough, shortness of breath, chest pain, urinary complaints, bleeding, bruising, rash, joint pains, or other concerns.   PHYSICAL EXAM:  BP 114/70 (BP Location: Left Arm, Patient Position: Sitting, Cuff Size: Normal)   Pulse 68   Temp 99.2 F (37.3 C) (  Tympanic)   Ht  (1.473 m)   Wt 127 lb 3.2 oz (57.7 kg)   BMI 26.58 kg/m   Well appearing, pleasant female in good spirits, in no distress HEENT: conjunctiva and sclera clear, anicteric. OP is clear, moist mucus membranes Neck: no lymphadenopathy or mass Heart: regular rate and rhythm Lungs: clear bilaterally Abdomen: soft, active bowel sounds.  Slightly tender overlying the inferolateral ribs on the left only when standing. No significant mass or asymmetry noted. Slightly tender LLQ, no rebound guarding or mass. Active bowel sounds Extremities: no edema Neuro: alert and oriented, cranial nerves intact, normal strength, gait Skin: normal turgor, no rash  or lesions Psych: normal mood, affect, hygiene and grooming   ASSESSMENT/PLAN:   Diarrhea, unspecified type   Counseled re: proper diet--BRAT, avoidance of dairy. Continue probiotics and imodium prn. Abdominal exam is benign, do not suspect diverticulitis (unless very early).  May need stool studies and labs if diarrhea persists, rather than resolves over the next 1-2 days.

## 2016-12-17 ENCOUNTER — Telehealth: Payer: Self-pay | Admitting: Family Medicine

## 2016-12-17 NOTE — Telephone Encounter (Signed)
Patient said she is just feeling weak, no more loose stools as of now. She is hydrating and eating some. Has some gas but no abd pain, no fevers. She will try to get some more food into herself tonight and call tomorrow if she has any further problems.

## 2016-12-17 NOTE — Telephone Encounter (Signed)
Please check in on patient and see how she is feeling, as she should have eaten something by now.  If no further loose stools, it is important to stay well hydrated. She needs to continue the bland diet, but can advance some (more than just BRAT--can eat some grilled chicken, but nothing greasy/fried or with any dairy or spicy foods). It may take another day or so to get her energy back. Be sure no fevers or abdominal pain

## 2016-12-17 NOTE — Telephone Encounter (Signed)
Pt has not had loose bowels since yesterday evening. She is still weak however pt says that may be due to her not eating. She is going to try to eat in a few hours.

## 2016-12-18 ENCOUNTER — Telehealth: Payer: Self-pay | Admitting: Family Medicine

## 2016-12-18 NOTE — Telephone Encounter (Signed)
Pt called stating that she had 2 loose, dark bowel movements last night after dinner with last bowel movement being at 9:30 pm. She has not had any more loose stools since last night. Pt has eaten breakfast and lunch today and feel a little better than yesterday.

## 2016-12-18 NOTE — Telephone Encounter (Signed)
Noted; seems to be improving gradually

## 2017-01-12 ENCOUNTER — Other Ambulatory Visit: Payer: Self-pay | Admitting: Cardiology

## 2017-01-12 ENCOUNTER — Other Ambulatory Visit: Payer: Self-pay | Admitting: Family Medicine

## 2017-01-12 DIAGNOSIS — E782 Mixed hyperlipidemia: Secondary | ICD-10-CM

## 2017-01-12 NOTE — Telephone Encounter (Signed)
REFILL 

## 2017-01-12 NOTE — Telephone Encounter (Signed)
Rx has been sent to the pharmacy electronically. ° °

## 2017-01-14 ENCOUNTER — Other Ambulatory Visit: Payer: Self-pay | Admitting: Family Medicine

## 2017-01-14 ENCOUNTER — Other Ambulatory Visit: Payer: Self-pay | Admitting: Pharmacist Clinician (PhC)/ Clinical Pharmacy Specialist

## 2017-01-14 DIAGNOSIS — Z1231 Encounter for screening mammogram for malignant neoplasm of breast: Secondary | ICD-10-CM

## 2017-01-14 MED ORDER — APIXABAN 2.5 MG PO TABS: 2.5000 mg | ORAL_TABLET | Freq: Two times a day (BID) | ORAL | 1 refills | Status: DC

## 2017-01-21 ENCOUNTER — Other Ambulatory Visit: Payer: Medicare Other

## 2017-01-21 DIAGNOSIS — E782 Mixed hyperlipidemia: Secondary | ICD-10-CM | POA: Diagnosis not present

## 2017-01-21 LAB — HEPATIC FUNCTION PANEL
AG RATIO: 2.2 (calc) (ref 1.0–2.5)
ALT: 19 U/L (ref 6–29)
AST: 21 U/L (ref 10–35)
Albumin: 4.4 g/dL (ref 3.6–5.1)
Alkaline phosphatase (APISO): 51 U/L (ref 33–130)
Bilirubin, Direct: 0.1 mg/dL (ref 0.0–0.2)
Globulin: 2 g/dL (calc) (ref 1.9–3.7)
Indirect Bilirubin: 0.4 mg/dL (calc) (ref 0.2–1.2)
TOTAL PROTEIN: 6.4 g/dL (ref 6.1–8.1)
Total Bilirubin: 0.5 mg/dL (ref 0.2–1.2)

## 2017-01-21 LAB — LIPID PANEL
Cholesterol: 142 mg/dL (ref <200)
HDL: 51 mg/dL (ref >50)
LDL Cholesterol (Calc): 63 mg/dL (calc)
NON-HDL CHOLESTEROL (CALC): 91 mg/dL (ref <130)
TRIGLYCERIDES: 210 mg/dL — AB (ref <150)
Total CHOL/HDL Ratio: 2.8 (calc) (ref <5.0)

## 2017-01-23 ENCOUNTER — Other Ambulatory Visit: Payer: Self-pay | Admitting: Family Medicine

## 2017-01-23 DIAGNOSIS — E782 Mixed hyperlipidemia: Secondary | ICD-10-CM

## 2017-01-23 DIAGNOSIS — Z5181 Encounter for therapeutic drug level monitoring: Secondary | ICD-10-CM

## 2017-01-23 DIAGNOSIS — Z7901 Long term (current) use of anticoagulants: Secondary | ICD-10-CM

## 2017-01-26 ENCOUNTER — Other Ambulatory Visit: Payer: Medicare Other

## 2017-02-11 ENCOUNTER — Ambulatory Visit: Payer: Medicare Other | Admitting: Cardiology

## 2017-02-13 ENCOUNTER — Telehealth: Payer: Self-pay | Admitting: Cardiology

## 2017-02-13 NOTE — Telephone Encounter (Signed)
Returned call to patient appointment rescheduled with Dr.Jordan 03/16/17 at 1:40 pm.

## 2017-02-13 NOTE — Telephone Encounter (Signed)
New Message     Patient would like to speak with Dr SwazilandJordan nurse about the appt she missed due to the weather

## 2017-02-25 DIAGNOSIS — E7800 Pure hypercholesterolemia, unspecified: Secondary | ICD-10-CM | POA: Insufficient documentation

## 2017-02-25 DIAGNOSIS — I480 Paroxysmal atrial fibrillation: Secondary | ICD-10-CM | POA: Insufficient documentation

## 2017-02-25 DIAGNOSIS — K5790 Diverticulosis of intestine, part unspecified, without perforation or abscess without bleeding: Secondary | ICD-10-CM | POA: Insufficient documentation

## 2017-02-25 DIAGNOSIS — G4730 Sleep apnea, unspecified: Secondary | ICD-10-CM | POA: Insufficient documentation

## 2017-02-25 DIAGNOSIS — N3941 Urge incontinence: Secondary | ICD-10-CM | POA: Insufficient documentation

## 2017-02-25 DIAGNOSIS — H40009 Preglaucoma, unspecified, unspecified eye: Secondary | ICD-10-CM | POA: Insufficient documentation

## 2017-03-02 ENCOUNTER — Ambulatory Visit
Admission: RE | Admit: 2017-03-02 | Discharge: 2017-03-02 | Disposition: A | Discharge status: Home / Self Care | Payer: Medicare Other | Source: Ambulatory Visit | Attending: Family Medicine | Admitting: Family Medicine

## 2017-03-02 DIAGNOSIS — Z1231 Encounter for screening mammogram for malignant neoplasm of breast: Secondary | ICD-10-CM

## 2017-03-12 NOTE — Progress Notes (Signed)
Abigail Day Date of Birth: 05-29-1933 Medical Record #[id number]  History of Present Illness: Abigail Day is seen for follow up of Afib. She has a history of paroxysmal atrial fib - on Eliquis. EF is normal per echo back in December of 2013. Normal Myoview in June 2015. Other issues include HLD, hypothyroidism with prior ablation for hyperthyroidism, and diverticulosis. She is  on CPAP therapy for sleep apnea-followed by pulmonary.   On follow up today she is feeling very well.  Still exercises 3 days a week. Denies any episodes of Afib.  No SOB or CP. No dizziness.  She is using CPAP.    Current Outpatient Medications on File Prior to Visit  Medication Sig Dispense Refill  . apixaban (ELIQUIS) 2.5 MG TABS tablet Take 1 tablet (2.5 mg total) 2 (two) times daily by mouth. 180 tablet 1  . Calcium Carbonate-Vitamin D (CALCIUM 600 + D PO) Take 2 tablets by mouth daily.      . Cholecalciferol (VITAMIN D) 1000 UNITS capsule Take 1,000 Units by mouth daily.      Marland Kitchen co-enzyme Q-10 30 MG capsule Take 30 mg by mouth daily.      . fluticasone (FLONASE) 50 MCG/ACT nasal spray Place 2 sprays into both nostrils daily. (Patient not taking: Reported on 12/15/2016) 16 g 6  . lactase (LACTAID) 3000 UNITS tablet Take 3,000 Units by mouth 3 (three) times daily with meals.    . Lactobacillus (PROBIOTIC ACIDOPHILUS PO) Take 1 tablet by mouth every morning.    Marland Kitchen levothyroxine (SYNTHROID, LEVOTHROID) 75 MCG tablet Take 1 tablet (75 mcg total) by mouth daily. 90 tablet 1  . Loperamide HCl (IMODIUM PO) Take 1 tablet by mouth as needed.    . loratadine (CLARITIN) 10 MG tablet Take 10 mg by mouth daily.    . metoprolol tartrate (LOPRESSOR) 25 MG tablet TAKE 1 TABLET BY MOUTH 2 TIMES A DAY 180 tablet 1  . Misc Natural Products (OSTEO BI-FLEX JOINT SHIELD PO) Take 2 tablets by mouth daily.    . Multiple Vitamin (MULTI-VITAMIN) tablet Take 1 tablet by mouth daily.      . Multiple Vitamins-Minerals (ICAPS PO) Take 1 tablet by  mouth daily.    . Omega-3 Fatty Acids (FISH OIL OMEGA-3 PO) Take 4,800 mg by mouth daily.    . rosuvastatin (CRESTOR) 20 MG tablet TAKE 1 TABLET BY MOUTH ONCE DAILY 90 tablet 1   No current facility-administered medications on file prior to visit.     No Known Allergies  Past Medical History:  Diagnosis Date  . Diverticulosis   . Dyspnea on exertion 02/18/2012  . Eye exam abnormal 05/08/15   Dr.Groat-glaucoma suspect  . Glaucoma suspect   . Hemorrhoids    internal and external  . Hemorrhoids 09/25/10   internal and external  . Hyperplastic colon polyp 09/25/10   Dr. Ewing Schlein  . Osteopenia   . Palpitations 02/18/2012  . Paroxysmal atrial fibrillation (HCC)   . Pure hypercholesterolemia   . Sleep apnea   . Unspecified hypothyroidism    s/p ablation for hyperthyroidism  . Urge urinary incontinence     Past Surgical History:  Procedure Laterality Date  . ABDOMINAL HYSTERECTOMY  age 54   with BSO and bladder tack  . COLONOSCOPY  11/06, 09/25/10, 12/2015   hyperplastic polyp 2012;   . ESOPHAGOGASTRODUODENOSCOPY  09/25/10   tortuous esophagus, intact Nissen fundoplication, dilated the spasm at esophageal GE junction  . EYE SURGERY     bilateral  cataract surgery  . LAPAROSCOPIC NISSEN FUNDOPLICATION  8/07   Dr. Daphine DeutscherMartin    Social History   Tobacco Use  Smoking Status Never Smoker  Smokeless Tobacco Never Used    Social History   Substance and Sexual Activity  Alcohol Use Yes  . Alcohol/week: 0.0 oz   Comment: once a month or less    Family History  Problem Relation Age of Onset  . Heart disease Mother   . Heart disease Father   . Diabetes Father   . Marfan syndrome Sister   . Heart disease Sister        valve replacement  . Heart disease Brother   . Diabetes Brother   . Heart disease Brother   . Diabetes Brother   . Heart disease Brother   . Heart disease Brother   . Heart disease Brother        CABG  . Heart disease Brother        CABG  . Heart disease  Sister        pacemaker  . Aortic aneurysm Sister   . Peripheral Artery Disease Sister        amputation of toes  . Breast cancer Sister [id number]  . Heart disease Sister        CABG at [id number]  . Colon cancer Sister [id number]  . Heart disease Sister        CABG  . Heart disease Brother   . Heart disease Sister   . Hyperlipidemia Sister   . Stroke Sister     Review of Systems: The review of systems is per the HPI.  All other systems were reviewed and are negative.  Physical Exam: There were no vitals taken for this visit. Patient is very pleasant and in no acute distress.  Skin is warm and dry. Color is normal.  HEENT is unremarkable. Normocephalic/atraumatic. PERRL. Sclera are nonicteric. Neck is supple. No masses. No JVD. Lungs are clear. Cardiac exam shows a regular rate and rhythm. No gallop or murmur. Abdomen is soft. Extremities are without edema. Gait and ROM are intact. No gross neurologic deficits noted.  LABORATORY DATA:     Lab Results  Component Value Date   WBC 3.1 (L) 10/20/2016   HGB 13.4 10/20/2016   HCT 40.1 10/20/2016   PLT 216 10/20/2016   GLUCOSE 90 04/21/2016   CHOL 142 01/21/2017   TRIG 210 (H) 01/21/2017   HDL 51 01/21/2017   LDLCALC 93 10/20/2016   ALT 19 01/21/2017   AST 21 01/21/2017   NA 142 04/21/2016   K 4.4 04/21/2016   CL 105 04/21/2016   CREATININE 0.83 04/21/2016   BUN 15 04/21/2016   CO2 29 04/21/2016   TSH 1.38 10/20/2016   HGBA1C 5.4 09/24/2015    Assessment / Plan:  1. Obstructive sleep apnea. On CPAP therapy  2. PAF - treated with rate control with metoprolol and Eliquis. She is in sinus by exam today. No symptomatic Afib. Will continue with current rate control strategy.   3. Hypercholesterolemia. Most recent lipid panel showed excellent LDL of 63 on Crestor. Mildly elevated triglycerides.  Follow up in 6 months.

## 2017-03-16 ENCOUNTER — Ambulatory Visit (INDEPENDENT_AMBULATORY_CARE_PROVIDER_SITE_OTHER): Payer: Medicare Other | Admitting: Cardiology

## 2017-03-16 ENCOUNTER — Encounter: Payer: Self-pay | Admitting: Cardiology

## 2017-03-16 VITALS — BP 116/68 | HR 60 | Ht 59.0 in | Wt 127.0 lb

## 2017-03-16 DIAGNOSIS — I480 Paroxysmal atrial fibrillation: Secondary | ICD-10-CM | POA: Diagnosis not present

## 2017-03-16 DIAGNOSIS — E782 Mixed hyperlipidemia: Secondary | ICD-10-CM | POA: Diagnosis not present

## 2017-03-16 MED ORDER — BLOOD PRESSURE MONITORING KIT: PACK | 0 refills | Status: DC

## 2017-03-16 NOTE — Patient Instructions (Signed)
Continue your current therapy  I will see you in 6 months.   

## 2017-04-13 ENCOUNTER — Ambulatory Visit: Payer: Medicare Other | Admitting: Cardiology

## 2017-04-17 ENCOUNTER — Telehealth: Payer: Self-pay

## 2017-04-17 ENCOUNTER — Other Ambulatory Visit: Payer: Self-pay | Admitting: Family Medicine

## 2017-04-17 DIAGNOSIS — E890 Postprocedural hypothyroidism: Secondary | ICD-10-CM

## 2017-04-17 NOTE — Telephone Encounter (Signed)
Forward to kim. 

## 2017-04-17 NOTE — Telephone Encounter (Signed)
Pharmacy wanted to know if another brand of levothyroxine 75 mcg would be ok to for pt to use. New brand name is mylan. Is it ok to switch. [facility]

## 2017-04-17 NOTE — Telephone Encounter (Signed)
I'd prefer for her to remain on the same one and not switch.  If this isn't an option, and she needs to change, then TSH will need to be recheck in 2-3 months. (she has visit with me later this month, we can discuss and enter future orders and schedule at that time, if she switches)--she just needs to be aware of whether it was changed or not.

## 2017-04-23 ENCOUNTER — Other Ambulatory Visit: Payer: Medicare Other

## 2017-04-23 DIAGNOSIS — Z7901 Long term (current) use of anticoagulants: Secondary | ICD-10-CM

## 2017-04-23 DIAGNOSIS — Z5181 Encounter for therapeutic drug level monitoring: Secondary | ICD-10-CM

## 2017-04-23 DIAGNOSIS — E782 Mixed hyperlipidemia: Secondary | ICD-10-CM | POA: Diagnosis not present

## 2017-04-23 NOTE — Addendum Note (Signed)
Addended by: Winn JockVALENTINE, Alok Minshall N on: 04/23/2017 08:30 AM   Modules accepted: Orders

## 2017-04-23 NOTE — Addendum Note (Signed)
Addended by: Shantal Roan N on: 04/23/2017 08:30 AM   Modules accepted: Orders  

## 2017-04-23 NOTE — Addendum Note (Signed)
Addended by: Carolyn Sylvia N on: 04/23/2017 08:30 AM   Modules accepted: Orders  

## 2017-04-24 ENCOUNTER — Other Ambulatory Visit: Payer: Medicare Other

## 2017-04-24 LAB — COMPREHENSIVE METABOLIC PANEL
A/G RATIO: 2 (ref 1.2–2.2)
ALT: 23 IU/L (ref 0–32)
AST: 23 IU/L (ref 0–40)
Albumin: 4.3 g/dL (ref 3.5–4.7)
Alkaline Phosphatase: 63 IU/L (ref 39–117)
BILIRUBIN TOTAL: 0.4 mg/dL (ref 0.0–1.2)
BUN/Creatinine Ratio: 20 (ref 12–28)
BUN: 17 mg/dL (ref 8–27)
CHLORIDE: 104 mmol/L (ref 96–106)
CO2: 23 mmol/L (ref 20–29)
Calcium: 9.5 mg/dL (ref 8.7–10.3)
Creatinine, Ser: 0.84 mg/dL (ref 0.57–1.00)
GFR calc Af Amer: 75 mL/min/{1.73_m2} (ref >59)
GFR calc non Af Amer: 65 mL/min/{1.73_m2} (ref >59)
GLOBULIN, TOTAL: 2.1 g/dL (ref 1.5–4.5)
Glucose: 85 mg/dL (ref 65–99)
POTASSIUM: 4.5 mmol/L (ref 3.5–5.2)
SODIUM: 142 mmol/L (ref 134–144)
Total Protein: 6.4 g/dL (ref 6.0–8.5)

## 2017-04-24 LAB — CBC WITH DIFFERENTIAL/PLATELET
BASOS: 0 %
Basophils Absolute: 0 10*3/uL (ref 0.0–0.2)
EOS (ABSOLUTE): 0.1 10*3/uL (ref 0.0–0.4)
EOS: 3 %
HEMATOCRIT: 37.2 % (ref 34.0–46.6)
HEMOGLOBIN: 12.8 g/dL (ref 11.1–15.9)
IMMATURE GRANULOCYTES: 0 %
Immature Grans (Abs): 0 10*3/uL (ref 0.0–0.1)
Lymphocytes Absolute: 1.1 10*3/uL (ref 0.7–3.1)
Lymphs: 29 %
MCH: 33.3 pg — ABNORMAL HIGH (ref 26.6–33.0)
MCHC: 34.4 g/dL (ref 31.5–35.7)
MCV: 97 fL (ref 79–97)
MONOCYTES: 12 %
MONOS ABS: 0.5 10*3/uL (ref 0.1–0.9)
NEUTROS PCT: 56 %
Neutrophils Absolute: 2.2 10*3/uL (ref 1.4–7.0)
Platelets: 233 10*3/uL (ref 150–379)
RBC: 3.84 x10E6/uL (ref 3.77–5.28)
RDW: 13.3 % (ref 12.3–15.4)
WBC: 3.9 10*3/uL (ref 3.4–10.8)

## 2017-04-24 LAB — LIPID PANEL
CHOL/HDL RATIO: 2.6 ratio (ref 0.0–4.4)
Cholesterol, Total: 143 mg/dL (ref 100–199)
HDL: 56 mg/dL (ref >39)
LDL CALC: 62 mg/dL (ref 0–99)
TRIGLYCERIDES: 123 mg/dL (ref 0–149)
VLDL CHOLESTEROL CAL: 25 mg/dL (ref 5–40)

## 2017-04-26 NOTE — Progress Notes (Addendum)
Chief Complaint  Patient presents with  . Medicare Wellness    nonfasting AWV with pelvic exam. Has had sinus pain/pressure and discolored mucus x 4 weeks. Is taking 5,000iu vitamin d between MVI, Ca + D and separate Vit D.     Abigail Day is a 84 y.o. female who presents for annual wellness visit and follow-up on chronic medical conditions.  She has the following concerns:  "my head is full of sinus", issues x 4 weeks.  She has been using the Neti-pot and some sinus tablets (phenylephrine).  It is starting to feel better, less drainage, and clearer.  She started out with a cold, and it has finally been getting better. She is compliant with her flonase daily.  Hypothyroidism: Denies changes in weight,energy, skin, nails (nails sometimes feel "loose", not different), moods. Bowels unchanged, still slightly loose, but much better than I the past. Uses Lactaid prn before dairy with dinner, and takes a probiotic daily. We got a call earlier this month requesting change in generic provider of medication. She picked up the new prescription (change in generic), but hasn't started it yet (didn't run out yet). Lab Results  Component Value Date   TSH 1.38 10/20/2016   Mixed hyperlipidemia: At her last visit in August, LDL at goal, <100, but elevated TG.  Pravastatin was changed to Crestor, as she previously didn't tolerate lipitor. Recheck showed improvement in LDL (63), TG remained somewhat elevated, though lower.  She had labs prior to today's visit, see below. She continues on fish oil (3/d),  and tries to follow lowfat, low cholesterol diet. As previously discussed (reviewed and unchanged, per pt): Doesn't eat dessert at night (from dining room). +sweets at home. Some fried foods in the dining room.  Always has the option for baked chicken. Mostly fish, shrimp, chicken.  Only drinks water, small juice glass of cranberry juice in the mornings only. Has chips with sandwich for lunch.  Hasaching in her  legs which feels better when she wears compression stockings. She has some stinging in the bottom of her feet at the end of the day--when she wears the compression socks, feel rough, and moisturizing at night helps. She has been using a topical salve every other night which helps a lot with this discomfort. She cannot recall the name of the cream. She later called with the name--Magnilife DB.  Atrial fibrillation: She remains on blood thinners without any bleeding or complications. She sees Dr. Martinique every 6 months, last in January.  No med changes were made, she was in sinus rhythm. She denies tachycardia, no known episodes of atrial fibrillation (hasn't had symptoms in quite a while, maybe just slight when her sinuses were full and she used decongestant a few days ago). She denies chest pain, shortness of breath. Sometimes slightly winded after walking up 3 flights of stairs--unchanged.  HTN: Occasionally checks BP, runs 115-120's/60's-70's. Pulse in the is usually around 60.No headaches (other than recent sinus issues), dizziness, chest pain. She needs to get up slowly in the morning, to avoid dizziness.   OSA: She is using CPAP, and doing well. She last saw Dr. Halford Chessman in September. She feels more refreshed since being on CPAP.   Osteopenia--She restarted Boniva after her last bone density 04/2014. Denies any reflux, chest pain or other side effects.LastDEXA was 05/2016, T-1.6 in L fem neck, not significantly changed from 04/2014:  At one time she didn't take the [facility] for a month, held it to see if [facility]  was contributing to her bony complaints. She didn't notice a difference, so resumed taking it. She  Reported at her last visit that she hadn't taken it for about 4 months.  She didn't feel like it was helping, since the last DEXA looked the same. Not stopped due to side effects or problems. She restarted monthly Boniva after her last visit (10/2016), and denies side  effects.  Allergies--Flonase was added at her last visit in August--at that time she had increased her claritin to BID, and she was asked to decrease to once daily. She had been having increased hoarseness in the afternoons.  Since using the flonase and claritin, allergies are better controlled.  Recent sinus issues/URI as stated above.  Overall allergies and frontal headaches have been better on this regimen.   Urinary frequency.She took physical therapy in the past, doing Kegel's sporadically.  Some nights are worse than others. It was better after PT when doing the exercises regularly.   Immunization History  Administered Date(s) Administered  . Influenza Split 01/08/2011, 01/08/2012  . Influenza, High Dose Seasonal PF 12/08/2012, 12/19/2013, 12/21/2014, 12/13/2015, 10/23/2016  . Pneumococcal Conjugate-13 03/27/2014  . Pneumococcal Polysaccharide-23 01/31/2002, 01/08/2012  . Td 10/01/2004  . Tdap 01/08/2012  . Zoster 06/01/2008   Last Pap smear: n/a  Last mammogram: 01/2017 Last colonoscopy: 12/2015, negative biopsy.  No f/u needed Last DEXA:  05/2016 T-1.6 L fem neck, no significant change from 2016 Ophtho: yearly Dentist: every 9 months  Exercise: 30 minute exercise class 2x/week (posture class, uses bands); Goes to weight room (bicycle and elliptical, and some weights) x 45 minutes 3 x/week, and walks 30 minutes every day.   Other doctors caring for patient include: Cardiology: Dr. Martinique  Dermatologist: Dr. Allyson Sabal (retired) Ophtho: Dr. Katy Fitch  Dentist: Dr. Lavone Neri  Pulmonary (for OSA): Dr. Halford Chessman GI: Dr. Watt Climes   Depression screen: negative Fall Screen: negative Functional Status Screen: notable only for urinary incontinence (better when she is doing kegel's), diarrhea See full screens in epic  End of Life Discussion: Patient hasa living will and medical power of attorney  Past Medical History:  Diagnosis Date  . Diverticulosis   . Dyspnea on exertion  02/18/2012  . Eye exam abnormal 05/08/15   Dr.Groat-glaucoma suspect  . Glaucoma suspect   . Hemorrhoids    internal and external  . Hemorrhoids 09/25/10   internal and external  . Hyperplastic colon polyp 09/25/10   Dr. Watt Climes  . Osteopenia   . Palpitations 02/18/2012  . Paroxysmal atrial fibrillation (HCC)   . Pure hypercholesterolemia   . Sleep apnea   . Unspecified hypothyroidism    s/p ablation for hyperthyroidism  . Urge urinary incontinence     Past Surgical History:  Procedure Laterality Date  . ABDOMINAL HYSTERECTOMY  age 31   with BSO and bladder tack  . COLONOSCOPY  11/06, 09/25/10, 12/2015   hyperplastic polyp 2012;   . ESOPHAGOGASTRODUODENOSCOPY  09/25/10   tortuous esophagus, intact Nissen fundoplication, dilated the spasm at esophageal GE junction  . EYE SURGERY     bilateral cataract surgery  . LAPAROSCOPIC NISSEN FUNDOPLICATION  2/37   Dr. Hassell Done    Social History   Socioeconomic History  . Marital status: Married    Spouse name: Not on file  . Number of children: 2  . Years of education: Not on file  . Highest education level: Not on file  Social Needs  . Financial resource strain: Not on file  . Food insecurity - worry:  Not on file  . Food insecurity - inability: Not on file  . Transportation needs - medical: Not on file  . Transportation needs - non-medical: Not on file  Occupational History  . Occupation: retired  Tobacco Use  . Smoking status: Never Smoker  . Smokeless tobacco: Never Used  Substance and Sexual Activity  . Alcohol use: Yes    Alcohol/week: 0.0 oz    Comment: once a month or less  . Drug use: No  . Sexual activity: Not Currently  Other Topics Concern  . Not on file  Social History Narrative   Lives with husband. 2 sons--one in [place], one in [place].  3 grandchildren. Moved to [place] in 10/2012    Family History  Problem Relation Age of Onset  . Heart disease Mother   . Heart disease Father   . Diabetes  Father   . Marfan syndrome Sister   . Heart disease Sister        valve replacement  . Heart disease Brother   . Diabetes Brother   . Heart disease Brother   . Diabetes Brother   . Heart disease Brother   . Heart disease Brother   . Heart disease Brother        CABG  . Heart disease Brother        CABG  . Heart disease Sister        pacemaker  . Aortic aneurysm Sister   . Peripheral Artery Disease Sister        amputation of toes  . Breast cancer Sister 55  . Heart disease Sister        CABG at 28  . Colon cancer Sister 56  . Heart disease Sister        CABG  . Heart disease Sister   . Heart disease Brother   . Heart disease Sister   . Hyperlipidemia Sister   . Stroke Sister     Outpatient Encounter Medications as of 04/27/2017  Medication Sig Note  . apixaban (ELIQUIS) 2.5 MG TABS tablet Take 1 tablet (2.5 mg total) 2 (two) times daily by mouth.   . Blood Pressure Monitoring KIT Check B/P daily   . Calcium Carbonate-Vitamin D (CALCIUM 600 + D PO) Take 2 tablets by mouth daily.     . Cholecalciferol (VITAMIN D) 1000 UNITS capsule Take 1,000 Units by mouth daily.     Marland Kitchen co-enzyme Q-10 30 MG capsule Take 30 mg by mouth daily.     . fluticasone (FLONASE) 50 MCG/ACT nasal spray Place 2 sprays into both nostrils daily.   Marland Kitchen lactase (LACTAID) 3000 UNITS tablet Take 3,000 Units by mouth 3 (three) times daily with meals.   . Lactobacillus (PROBIOTIC ACIDOPHILUS PO) Take 1 tablet by mouth every morning.   Marland Kitchen levothyroxine (SYNTHROID, LEVOTHROID) 75 MCG tablet TAKE 1 TABLET BY MOUTH ONCE DAILY 04/27/2017: Changing to [place] end of 04/2017 (hasn't started yet)  . loratadine (CLARITIN) 10 MG tablet Take 10 mg by mouth daily.   . metoprolol tartrate (LOPRESSOR) 25 MG tablet TAKE 1 TABLET BY MOUTH 2 TIMES A DAY   . Misc Natural Products (OSTEO BI-FLEX JOINT SHIELD PO) Take 2 tablets by mouth daily.   . Multiple Vitamin (MULTI-VITAMIN) tablet Take 1 tablet by mouth daily.     . Multiple  Vitamins-Minerals (ICAPS PO) Take 1 tablet by mouth daily.   . Omega-3 Fatty Acids (FISH OIL OMEGA-3 PO) Take 1,800 mg by mouth daily.  04/27/2017:  Takes 3 capsules daily  . rosuvastatin (CRESTOR) 20 MG tablet TAKE 1 TABLET BY MOUTH ONCE DAILY   . Loperamide HCl (IMODIUM PO) Take 1 tablet by mouth as needed.    No facility-administered encounter medications on file as of 04/27/2017.     No Known Allergies  ROS: The patient denies anorexia, fever, headaches, vision changes, decreased hearing, ear pain, sore throat, breast concerns, chest pain, syncope, dyspnea on exertion (has some with stairs); denies cough, swelling, nausea, vomiting, constipation, abdominal pain, melena, hematochezia, indigestion/heartburn, dysphagia, dysuria, hematuria, vaginal bleeding, discharge, odor or itch, genital lesions, numbness (just bottom of feet at her toes, chronically), tingling, weakness, tremor, suspicious skin lesions, depression, anxiety, abnormal bleeding/bruising, or enlarged lymph nodes.  Some trouble remembering names, no other memory concerns. No progression/change in the last year. +urge incontinence per HPI Pain at right shoulderblade sometimes when she is very tired. Some right knee pain at night intermittently. No pain with activity. URI symptoms per HPI   PHYSICAL EXAM:  BP 128/74   Pulse 60   Temp 97.7 F (36.5 C) (Tympanic)   Ht 4' 10.5" (1.486 m)   Wt 125 lb 6.4 oz (56.9 kg)   BMI 25.76 kg/m   Wt Readings from Last 3 Encounters:  04/27/17 125 lb 6.4 oz (56.9 kg)  03/16/17 127 lb (57.6 kg)  12/15/16 127 lb 3.2 oz (57.7 kg)    General Appearance:  Alert, cooperative, no distress, appears stated age.  Voice is slightly hoarse. No coughing.  Head:  Normocephalic, without obvious abnormality, atraumatic   Eyes:  PERRL, conjunctiva/corneas clear, EOM's intact, fundi benign   Ears:  Normal TM's and external ear canals   Nose:  Nares normal, mucosa has mod edema on  left, mild on right. No erythema or purulence. Mildly tender sinuses x 4, more on the left side than the right..  Throat:  Lips, mucosa, and tongue normal; teeth and gums normal. Upper dentures and partials on lower.   Neck:  Supple, no lymphadenopathy; thyroid: no enlargement/ tenderness/nodules; no carotid bruit or JVD   Back:  Spine nontender; ROM normal. No CVA tenderness. +kyphosis and scoliosis.   Lungs:  Clear to auscultation bilaterally without wheezes, rales or ronchi; respirations unlabored   Chest Wall:  No tenderness or deformity   Heart:  Regular rate and rhythm, S1 and S2 normal, no murmur, rub or gallop.  Breast Exam:  No tenderness, masses, or nipple discharge or inversion. No axillary lymphadenopathy   Abdomen:  Soft, nondistended, normoactive bowel sounds, nontender; no masses, no hepatosplenomegaly.  Genitalia:  Normal external genitalia without lesions. BUS and vagina normal, atrophic changes noted; No abnormal vaginal discharge. Uterus and adnexa surgically absent, no masses. Pap not performed   Rectal:  Normal tone, no masses or tenderness; guaiac negative stool   Extremities:  No clubbing, cyanosis or edema. 2+ pulses.  Pulses:  2+ and symmetric all extremities   Skin:  Skin color, texture, turgor normal, no lesions or rash  Lymph nodes:  Cervical, supraclavicular, and axillary nodes normal   Neurologic:  CNII-XII intact, normal strength, sensation and gait; reflexes 2+ and symmetric throughout. Alert and oriented.   Psych: Normal mood, affect, hygiene and grooming    Lab Results  Component Value Date   CHOL 143 04/23/2017   HDL 56 04/23/2017   LDLCALC 62 04/23/2017   TRIG 123 04/23/2017   CHOLHDL 2.6 04/23/2017     Chemistry      Component Value Date/Time   NA  142 04/23/2017 0833   K 4.5 04/23/2017 0833   CL 104 04/23/2017 0833   CO2 23 04/23/2017 0833   BUN 17 04/23/2017 0833    CREATININE 0.84 04/23/2017 0833   CREATININE 0.83 04/21/2016 0852      Component Value Date/Time   CALCIUM 9.5 04/23/2017 0833   ALKPHOS 63 04/23/2017 0833   AST 23 04/23/2017 0833   ALT 23 04/23/2017 0833   BILITOT 0.4 04/23/2017 0833     Fasting glucose 85  Lab Results  Component Value Date   WBC 3.9 04/23/2017   HGB 12.8 04/23/2017   HCT 37.2 04/23/2017   MCV 97 04/23/2017   PLT 233 04/23/2017    ASSESSMENT/PLAN:  Medicare annual wellness visit, subsequent  Mixed hyperlipidemia - improved, at goal; continue current regimen  Paroxysmal atrial fibrillation (HCC) - asymptomatic  Anticoagulant long-term use - no bleeding complications  Postablative hypothyroidism - discussed generics, and need to recheck TSH if changes manufacturer; f/u 6-8 wks for TSH - Plan: TSH  Obstructive sleep apnea - doing well on CPAP, continue; weight loss encouraged  Allergic rhinitis, unspecified seasonality, unspecified trigger - proper use of nasal steroids reviewed, supportive measures. URI currently; no e/o bacterial infection  Osteopenia of neck of left femur - continue bisphosphonate; DEXA due 05/2018; discussed Ca, D (taking more than she needs)   Discussed monthly self breast exams and yearly mammograms; at least 30 minutes of aerobic activity at least 5 days/week, weight-bearing exercise at least 2x/week; proper sunscreen use reviewed; healthy diet, including goals of calcium and vitamin D intake and alcohol recommendations (less than or equal to 1 drink/day) reviewed; regular seatbelt use; changing batteries in smoke detectors. Immunization recommendations discussed--UTD. Continue yearly high dose flu shots. Shingrix recommended, to get from pharmacy. Colonoscopy recommendations reviewed, UTD. DEXA due 05/2018  F/u 6 mos for med check; SCHEDULE LAB VISIT 8 weeks to recheck TSH since changing generic manufacturer.  MOST form reviewed and updated, full care.  Avoid use of sinus  medications (PE).  You don't need to be taking the separate vitamin D3  Contact us in the next week or two if you end up having worsening sinus pain and the mucus changes to yellow-green.  Currently there doesn't appear to be infection, but if these changes occur, then one has developed and you will need an antibiotic.   Medicare Attestation I have personally reviewed: The patient's medical and social history Their use of alcohol, tobacco or illicit drugs Their current medications and supplements The patient's functional ability including ADLs,fall risks, home safety risks, cognitive, and hearing and visual impairment Diet and physical activities Evidence for depression or mood disorders  The patient's weight, height and BMI been recorded in the chart.  I have made referrals, counseling, and provided education to the patient based on review of the above and I have provided the patient with a written personalized care plan for preventive services.

## 2017-04-27 ENCOUNTER — Ambulatory Visit (INDEPENDENT_AMBULATORY_CARE_PROVIDER_SITE_OTHER): Payer: Medicare Other | Admitting: Family Medicine

## 2017-04-27 ENCOUNTER — Encounter: Payer: Self-pay | Admitting: Family Medicine

## 2017-04-27 ENCOUNTER — Other Ambulatory Visit: Payer: Self-pay | Admitting: Family Medicine

## 2017-04-27 ENCOUNTER — Telehealth: Payer: Self-pay | Admitting: *Deleted

## 2017-04-27 VITALS — BP 128/74 | HR 60 | Temp 97.7°F | Ht 58.5 in | Wt 125.4 lb

## 2017-04-27 DIAGNOSIS — Z0000 Encounter for general adult medical examination without abnormal findings: Secondary | ICD-10-CM | POA: Diagnosis not present

## 2017-04-27 DIAGNOSIS — J309 Allergic rhinitis, unspecified: Secondary | ICD-10-CM

## 2017-04-27 DIAGNOSIS — M85852 Other specified disorders of bone density and structure, left thigh: Secondary | ICD-10-CM

## 2017-04-27 DIAGNOSIS — E782 Mixed hyperlipidemia: Secondary | ICD-10-CM

## 2017-04-27 DIAGNOSIS — G4733 Obstructive sleep apnea (adult) (pediatric): Secondary | ICD-10-CM

## 2017-04-27 DIAGNOSIS — I480 Paroxysmal atrial fibrillation: Secondary | ICD-10-CM | POA: Diagnosis not present

## 2017-04-27 DIAGNOSIS — E890 Postprocedural hypothyroidism: Secondary | ICD-10-CM | POA: Diagnosis not present

## 2017-04-27 DIAGNOSIS — Z7901 Long term (current) use of anticoagulants: Secondary | ICD-10-CM

## 2017-04-27 DIAGNOSIS — M8580 Other specified disorders of bone density and structure, unspecified site: Secondary | ICD-10-CM

## 2017-04-27 NOTE — Patient Instructions (Addendum)
  HEALTH MAINTENANCE RECOMMENDATIONS:  It is recommended that you get at least 30 minutes of aerobic exercise at least 5 days/week (for weight loss, you may need as much as 60-90 minutes). This can be any activity that gets your heart rate up. This can be divided in 10-15 minute intervals if needed, but try and build up your endurance at least once a week.  Weight bearing exercise is also recommended twice weekly.  Eat a healthy diet with lots of vegetables, fruits and fiber.  "Colorful" foods have a lot of vitamins (ie green vegetables, tomatoes, red peppers, etc).  Limit sweet tea, regular sodas and alcoholic beverages, all of which has a lot of calories and sugar.  Up to 1 alcoholic drink daily may be beneficial for women (unless trying to lose weight, watch sugars).  Drink a lot of water.  Calcium recommendations are 1200-1500 mg daily (1500 mg for postmenopausal women or women without ovaries), and vitamin D 1000 IU daily.  This should be obtained from diet and/or supplements (vitamins), and calcium should not be taken all at once, but in divided doses.  Monthly self breast exams and yearly mammograms for women over the age of 82 is recommended.  Sunscreen of at least SPF 30 should be used on all sun-exposed parts of the skin when outside between the hours of 10 am and 4 pm (not just when at beach or pool, but even with exercise, golf, tennis, and yard work!)  Use a sunscreen that says "broad spectrum" so it covers both UVA and UVB rays, and make sure to reapply every 1-2 hours.  Remember to change the batteries in your smoke detectors when changing your clock times in the spring and fall.  Use your seat belt every time you are in a car, and please drive safely and not be distracted with cell phones and texting while driving.   [name] , Thank you for taking time to come for your Medicare Wellness Visit. I appreciate your ongoing commitment to your health goals. Please review the following  plan we discussed and let me know if I can assist you in the future.   These are the goals we discussed: Goals    None      This is a list of the screening recommended for you and due dates:  Health Maintenance  Topic Date Due  . Tetanus Vaccine  01/07/2022  . Flu Shot  Completed  . DEXA scan (bone density measurement)  Completed  . Pneumonia vaccines  Completed   Next bone density will be due 05/2018. I recommend getting the new shingles vaccine (Shingrix). You will need to check with your insurance to see if it is covered, and if covered by Medicare Part D, you need to get from the pharmacy rather than our office.  It is a series of 2 injections, spaced 2 months apart.  Avoid use of sinus medications (PE).  These can raise blood pressure and not be good for the heart. You don't need to be taking the separate vitamin D3, you get enough from your other vitamins.  Contact us in the next week or two if you end up having worsening sinus pain and the mucus changes to yellow-green.  Currently there doesn't appear to be infection, but if these changes occur, then one has developed and you will need an antibiotic.

## 2017-04-27 NOTE — Telephone Encounter (Signed)
Spoke with patient and she wanted to let you know the cream she uses on her feet is called Magnilife DB.

## 2017-04-28 ENCOUNTER — Other Ambulatory Visit: Payer: Self-pay

## 2017-04-29 ENCOUNTER — Ambulatory Visit: Payer: Self-pay | Admitting: Family Medicine

## 2017-05-13 DIAGNOSIS — H401111 Primary open-angle glaucoma, right eye, mild stage: Secondary | ICD-10-CM | POA: Diagnosis not present

## 2017-05-13 DIAGNOSIS — H40012 Open angle with borderline findings, low risk, left eye: Secondary | ICD-10-CM | POA: Diagnosis not present

## 2017-05-13 DIAGNOSIS — H353131 Nonexudative age-related macular degeneration, bilateral, early dry stage: Secondary | ICD-10-CM | POA: Diagnosis not present

## 2017-05-13 DIAGNOSIS — Z961 Presence of intraocular lens: Secondary | ICD-10-CM | POA: Diagnosis not present

## 2017-06-04 ENCOUNTER — Encounter: Payer: Self-pay | Admitting: Pulmonary Disease

## 2017-06-22 ENCOUNTER — Other Ambulatory Visit: Payer: Medicare Other

## 2017-06-22 DIAGNOSIS — E890 Postprocedural hypothyroidism: Secondary | ICD-10-CM | POA: Diagnosis not present

## 2017-06-23 LAB — TSH: TSH: 0.256 u[IU]/mL — AB (ref 0.450–4.500)

## 2017-06-25 ENCOUNTER — Other Ambulatory Visit: Payer: Self-pay | Admitting: *Deleted

## 2017-06-25 MED ORDER — SYNTHROID 50 MCG PO TABS: 50.0000 ug | ORAL_TABLET | Freq: Every day | ORAL | 1 refills | Status: DC

## 2017-06-29 ENCOUNTER — Telehealth: Payer: Self-pay | Admitting: *Deleted

## 2017-06-29 NOTE — Telephone Encounter (Signed)
Patient given all info.

## 2017-06-29 NOTE — Telephone Encounter (Signed)
Patient called and went to pick up her Synthroid and her paperwork states that this could interact with her Boniva and that Synthroid causes bone loss in general. She also said that her niece was on Synthroid and Boniva and her doctor had her stop the [place].

## 2017-06-29 NOTE — Telephone Encounter (Signed)
Synthroid is no different than her generic thyroid medication when it comes to taking with Boniva.  They shouldn't be taken together (as both medications are supposed to be taken on an empty stomach, separate from other medications).  Synthroid should be taken first, on an empty stomach (ie when she first wakes up to go to the bathroom), and then the boniva at least 30-60 minutes later. The Sandrea Hammond is only once a month, so there is only an issue once a month about separating these medications. Thyroid medication in excess can weaken bones, but not at the proper dose. Tell her not to worry--there is no difference between her prior med and the synthroid.

## 2017-07-13 ENCOUNTER — Ambulatory Visit: Payer: Medicare Other | Admitting: Family Medicine

## 2017-07-15 ENCOUNTER — Other Ambulatory Visit: Payer: Self-pay | Admitting: Family Medicine

## 2017-07-15 ENCOUNTER — Other Ambulatory Visit: Payer: Self-pay | Admitting: Cardiology

## 2017-07-15 DIAGNOSIS — E782 Mixed hyperlipidemia: Secondary | ICD-10-CM

## 2017-07-29 ENCOUNTER — Other Ambulatory Visit: Payer: Self-pay | Admitting: Cardiology

## 2017-07-29 NOTE — Telephone Encounter (Signed)
Rx sent to pharmacy   

## 2017-08-09 NOTE — Progress Notes (Addendum)
Chief Complaint  Patient presents with  . follow-up    6 week follow-up on TSH    Patient presents for 6 week follow-up on hypothyroidism.  At her last visit, her TSH was found to be suppressed.  Her generic levothyroxine 20mg was changed to branded Synthroid, 525m. She is due for repeat TSH today.   Lab Results  Component Value Date   TSH 0.256 (L) 06/22/2017   She denies changes in energy. Since the dose change, she feels like her nails are more brittle, hair was more dry, but seems to be improving.  She had more hair loss, but this has also improved. No change in bowels (unchanged, intermittently loose, doing much better overall), moods, temperature. She takes her medication on an empty stomach, separate from other vitamins and food. See email with her question re: boniva. Discussed again today.  She is using flonase for allergies.  She has had some scabbing and irritation in her nose.    PMH, PSH, SH reviewed  Outpatient Encounter Medications as of 08/10/2017  Medication Sig Note  . apixaban (ELIQUIS) 2.5 MG TABS tablet Take 1 tablet (2.5 mg total) 2 (two) times daily by mouth.   . Blood Pressure Monitoring KIT Check B/P daily   . Calcium Carbonate-Vitamin D (CALCIUM 600 + D PO) Take 2 tablets by mouth daily.     . Marland Kitcheno-enzyme Q-10 30 MG capsule Take 30 mg by mouth daily.     . fluticasone (FLONASE) 50 MCG/ACT nasal spray Place 2 sprays into both nostrils daily.   . Marland Kitchenbandronate (BONIVA) 150 MG tablet TAKE 1 TABLET BY MOUTH ONCE EVERY MONTH, TAKE WITH A FULL GLASS OF WATER AND SO NOT EAT OR LAY DOWN FOR 30 MINUTES   . lactase (LACTAID) 3000 UNITS tablet Take 3,000 Units by mouth 3 (three) times daily with meals.   . Lactobacillus (PROBIOTIC ACIDOPHILUS PO) Take 1 tablet by mouth every morning.   . Loperamide HCl (IMODIUM PO) Take 1 tablet by mouth as needed.   . loratadine (CLARITIN) 10 MG tablet Take 10 mg by mouth daily.   . metoprolol tartrate (LOPRESSOR) 25 MG tablet TAKE 1  TABLET BY MOUTH TWICE DAILY   . Misc Natural Products (OSTEO BI-FLEX JOINT SHIELD PO) Take 2 tablets by mouth daily.   . Multiple Vitamin (MULTI-VITAMIN) tablet Take 1 tablet by mouth daily.     . Multiple Vitamins-Minerals (ICAPS PO) Take 1 tablet by mouth daily.   . Omega-3 Fatty Acids (FISH OIL OMEGA-3 PO) Take 1,800 mg by mouth daily.  08/10/2017: 360049m. rosuvastatin (CRESTOR) 20 MG tablet TAKE 1 TABLET BY MOUTH ONCE DAILY   . SYNTHROID 50 MCG tablet Take 1 tablet (50 mcg total) by mouth daily before breakfast.   . [DISCONTINUED] Cholecalciferol (VITAMIN D) 1000 UNITS capsule Take 1,000 Units by mouth daily.     . [DISCONTINUED] ELIQUIS 2.5 MG TABS tablet TAKE 1 TABLET BY MOUTH TWICE DAILY    No facility-administered encounter medications on file as of 08/10/2017.    No Known Allergies  ROS: no fever, chills, URI symptoms.  Some scabbing in nose, per HPI, allergies are controlled, has some intermittent voice hoarseness.  Denies chest pain, shortness of breath.  Hair/nails improving, per HPI.  Bowels are normal. Moods are good. Denies fatigue.  Compliant with CPAP.  See HPI.   PHYSICAL EXAM:  BP 100/68   Pulse (!) 58   Ht _0  (1.473 m)   Wt 131 lb (59.4 kg)  BMI 27.38 kg/m   Wt Readings from Last 3 Encounters:  08/10/17 131 lb (59.4 kg)  04/27/17 125 lb 6.4 oz (56.9 kg)  03/16/17 127 lb (57.6 kg)   Well appearing, pleasant female in no distress HEENT: conjunctiva and sclera are clear. Nasal mucosa is mod edematous, L>R, with some crusting on the right. No bleeding. OP is clear. Sinuses are nontender Neck: no lymphadenopathy or thyromegaly Heart: regular rate and rhythm Lungs: clear bilaterally Extremities: no edema Neuro: alert and oriented, cranial nerves intact. Normal gait Psych: normal mood, affect, hygiene and grooming  ASSESSMENT/PLAN:  Postablative hypothyroidism - recheck TSH since med changed to lower dose and branded Synthroid. appears to be euthyroid  clinically - Plan: TSH  Allergic rhinitis, unspecified seasonality, unspecified trigger - some nasal irritation from flonase (and also poss from CPAP). recommended frequent nasal saline.   If TSH ok, change to 90d supply of branded synthroid.  Use saline spray frequently in your nose to help with dryness and irritation.   Addendum: Lab Results  Component Value Date   TSH 4.070 08/10/2017   Borderline--will continue current dose of 64mg (refill sent), and recheck when she returns in August.

## 2017-08-10 ENCOUNTER — Ambulatory Visit (INDEPENDENT_AMBULATORY_CARE_PROVIDER_SITE_OTHER): Payer: Medicare Other | Admitting: Family Medicine

## 2017-08-10 ENCOUNTER — Encounter: Payer: Self-pay | Admitting: Family Medicine

## 2017-08-10 VITALS — BP 100/68 | HR 58 | Ht <= 58 in | Wt 131.0 lb

## 2017-08-10 DIAGNOSIS — J309 Allergic rhinitis, unspecified: Secondary | ICD-10-CM

## 2017-08-10 DIAGNOSIS — E890 Postprocedural hypothyroidism: Secondary | ICD-10-CM | POA: Diagnosis not present

## 2017-08-11 LAB — TSH: TSH: 4.07 u[IU]/mL (ref 0.450–4.500)

## 2017-08-11 MED ORDER — SYNTHROID 50 MCG PO TABS: 50.0000 ug | ORAL_TABLET | Freq: Every day | ORAL | 0 refills | Status: DC

## 2017-08-11 NOTE — Addendum Note (Signed)
Addended by: Joselyn ArrowKNAPP, Mack Alvidrez on: 08/11/2017 06:49 AM   Modules accepted: Orders

## 2017-09-12 NOTE — Progress Notes (Signed)
Abigail Day Date of Birth: Aug 29, 1933 Medical Record #[id number]  History of Present Illness: Abigail Day is seen for follow up of Afib. She has a history of paroxysmal atrial fib - on Eliquis. EF is normal per echo back in December of 2013. Normal Myoview in June 2015. Other issues include HLD, hypothyroidism with prior ablation for hyperthyroidism, and diverticulosis. She is  on CPAP therapy for sleep apnea-followed by pulmonary.   On follow up today she notes that over the past 2 months she has been experiencing increased SOB on exertion. She also notes pain beneath her scapula and her SOB is particularly bad when she has this pain. No edema. Some palpitations recently with change in her thyroid medication. She still exercises 3 days a week but states she is exhausted after one mile.   She is using CPAP.    Current Outpatient Medications on File Prior to Visit  Medication Sig Dispense Refill  . apixaban (ELIQUIS) 2.5 MG TABS tablet Take 1 tablet (2.5 mg total) 2 (two) times daily by mouth. 180 tablet 1  . Blood Pressure Monitoring KIT Check B/P daily 1 kit 0  . Calcium Carbonate-Vitamin D (CALCIUM 600 + D PO) Take 2 tablets by mouth daily.      Marland Kitchen co-enzyme Q-10 30 MG capsule Take 30 mg by mouth daily.      . fluticasone (FLONASE) 50 MCG/ACT nasal spray Place 2 sprays into both nostrils daily. 16 g 6  . ibandronate (BONIVA) 150 MG tablet TAKE 1 TABLET BY MOUTH ONCE EVERY MONTH, TAKE WITH A FULL GLASS OF WATER AND SO NOT EAT OR LAY DOWN FOR 30 MINUTES 3 tablet 3  . lactase (LACTAID) 3000 UNITS tablet Take 3,000 Units by mouth 3 (three) times daily with meals.    . Lactobacillus (PROBIOTIC ACIDOPHILUS PO) Take 1 tablet by mouth every morning.    . Loperamide HCl (IMODIUM PO) Take 1 tablet by mouth as needed.    . loratadine (CLARITIN) 10 MG tablet Take 10 mg by mouth daily.    . metoprolol tartrate (LOPRESSOR) 25 MG tablet TAKE 1 TABLET BY MOUTH TWICE DAILY 180 tablet 0  . Misc Natural Products  (OSTEO BI-FLEX JOINT SHIELD PO) Take 2 tablets by mouth daily.    . Multiple Vitamin (MULTI-VITAMIN) tablet Take 1 tablet by mouth daily.      . Multiple Vitamins-Minerals (ICAPS PO) Take 1 tablet by mouth daily.    . Omega-3 Fatty Acids (FISH OIL OMEGA-3 PO) Take 3,600 mg by mouth daily.     . rosuvastatin (CRESTOR) 20 MG tablet TAKE 1 TABLET BY MOUTH ONCE DAILY 90 tablet 0  . SYNTHROID 50 MCG tablet Take 1 tablet (50 mcg total) by mouth daily before breakfast. 90 tablet 0   No current facility-administered medications on file prior to visit.     No Known Allergies  Past Medical History:  Diagnosis Date  . Diverticulosis   . Dyspnea on exertion 02/18/2012  . Eye exam abnormal 05/08/15   Dr.Groat-glaucoma suspect  . Glaucoma suspect   . Hemorrhoids    internal and external  . Hemorrhoids 09/25/10   internal and external  . Hyperplastic colon polyp 09/25/10   Dr. Watt Climes  . Osteopenia   . Palpitations 02/18/2012  . Paroxysmal atrial fibrillation (HCC)   . Pure hypercholesterolemia   . Sleep apnea   . Unspecified hypothyroidism    s/p ablation for hyperthyroidism  . Urge urinary incontinence     Past Surgical History:  Procedure Laterality Date  . ABDOMINAL HYSTERECTOMY  age 36   with BSO and bladder tack  . COLONOSCOPY  11/06, 09/25/10, 12/2015   hyperplastic polyp 2012;   . ESOPHAGOGASTRODUODENOSCOPY  09/25/10   tortuous esophagus, intact Nissen fundoplication, dilated the spasm at esophageal GE junction  . EYE SURGERY     bilateral cataract surgery  . LAPAROSCOPIC NISSEN FUNDOPLICATION  3/33   Dr. Hassell Done    Social History   Tobacco Use  Smoking Status Never Smoker  Smokeless Tobacco Never Used    Social History   Substance and Sexual Activity  Alcohol Use Yes  . Alcohol/week: 0.0 oz   Comment: once a month or less    Family History  Problem Relation Age of Onset  . Heart disease Mother   . Heart disease Father   . Diabetes Father   . Marfan syndrome  Sister   . Heart disease Sister        valve replacement  . Heart disease Brother   . Diabetes Brother   . Heart disease Brother   . Diabetes Brother   . Heart disease Brother   . Heart disease Brother   . Heart disease Brother        CABG  . Heart disease Brother        CABG  . Heart disease Sister        pacemaker  . Aortic aneurysm Sister   . Peripheral Artery Disease Sister        amputation of toes  . Breast cancer Sister 61  . Heart disease Sister        CABG at 36  . Colon cancer Sister 38  . Heart disease Sister        CABG  . Heart disease Sister   . Heart disease Brother   . Heart disease Sister   . Hyperlipidemia Sister   . Stroke Sister     Review of Systems: The review of systems is per the HPI.  All other systems were reviewed and are negative.  Physical Exam: BP 116/62   Pulse (!) 58   Ht _0  (1.473 m)   Wt 129 lb 3.2 oz (58.6 kg)   BMI 27.00 kg/m  GENERAL:  [name] in NAD HEENT:  PERRL, EOMI, sclera are clear. Oropharynx is clear. NECK:  No jugular venous distention, carotid upstroke brisk and symmetric, no bruits, no thyromegaly or adenopathy LUNGS:  Clear to auscultation bilaterally CHEST:  Unremarkable HEART:  RRR,  PMI not displaced or sustained,S1 and S2 within normal limits, no S3, no S4: no clicks, no rubs, no murmurs ABD:  Soft, nontender. BS +, no masses or bruits. No hepatomegaly, no splenomegaly EXT:  2 + pulses throughout, no edema, no cyanosis no clubbing SKIN:  Warm and dry.  No rashes NEURO:  Alert and oriented x 3. Cranial nerves II through XII intact. PSYCH:  Cognitively intact    LABORATORY DATA:     Lab Results  Component Value Date   WBC 3.9 04/23/2017   HGB 12.8 04/23/2017   HCT 37.2 04/23/2017   PLT 233 04/23/2017   GLUCOSE 85 04/23/2017   CHOL 143 04/23/2017   TRIG 123 04/23/2017   HDL 56 04/23/2017   LDLCALC 62 04/23/2017   ALT 23 04/23/2017   AST 23 04/23/2017   NA 142 04/23/2017    K 4.5 04/23/2017   CL 104 04/23/2017   CREATININE 0.84 04/23/2017   BUN 17 04/23/2017  CO2 23 04/23/2017   TSH 4.070 08/10/2017   HGBA1C 5.4 09/24/2015   Ecg today shows NSR with rate 58. Nonspecific TWA. I have personally reviewed and interpreted this study.  Assessment / Plan:  1. Obstructive sleep apnea. On CPAP therapy  2. PAF - treated with rate control with metoprolol and Eliquis. She is in sinus by exam today. Sone palpitations with change in thyroid medication. Followed by Dr. Tomi Bamberger.  Will continue with current rate control strategy.   3. Hypercholesterolemia. Most recent lipid panel showed excellent LDL of 63 on Crestor. Mildly elevated triglycerides.  4. DOE and scapular pain. Possibly anginal equivalent. Will schedule for a Lexiscan myoview and Echo.   Follow up in 6 months.

## 2017-09-14 ENCOUNTER — Encounter: Payer: Self-pay | Admitting: Cardiology

## 2017-09-14 ENCOUNTER — Ambulatory Visit (INDEPENDENT_AMBULATORY_CARE_PROVIDER_SITE_OTHER): Payer: Medicare Other | Admitting: Cardiology

## 2017-09-14 VITALS — BP 116/62 | HR 58 | Ht <= 58 in | Wt 129.2 lb

## 2017-09-14 DIAGNOSIS — R0609 Other forms of dyspnea: Secondary | ICD-10-CM | POA: Diagnosis not present

## 2017-09-14 DIAGNOSIS — G8929 Other chronic pain: Secondary | ICD-10-CM

## 2017-09-14 DIAGNOSIS — I480 Paroxysmal atrial fibrillation: Secondary | ICD-10-CM

## 2017-09-14 DIAGNOSIS — E782 Mixed hyperlipidemia: Secondary | ICD-10-CM

## 2017-09-14 DIAGNOSIS — M898X1 Other specified disorders of bone, shoulder: Secondary | ICD-10-CM | POA: Diagnosis not present

## 2017-09-14 NOTE — Patient Instructions (Signed)
We will schedule you for a Nuclear stress test and Echocardiogram  Continue your current therapy   

## 2017-09-23 ENCOUNTER — Ambulatory Visit (HOSPITAL_COMMUNITY): Payer: Medicare Other | Attending: Internal Medicine

## 2017-09-23 ENCOUNTER — Other Ambulatory Visit: Payer: Self-pay

## 2017-09-23 DIAGNOSIS — Z8249 Family history of ischemic heart disease and other diseases of the circulatory system: Secondary | ICD-10-CM | POA: Insufficient documentation

## 2017-09-23 DIAGNOSIS — R0609 Other forms of dyspnea: Secondary | ICD-10-CM

## 2017-09-23 DIAGNOSIS — G8929 Other chronic pain: Secondary | ICD-10-CM | POA: Diagnosis not present

## 2017-09-23 DIAGNOSIS — R609 Edema, unspecified: Secondary | ICD-10-CM | POA: Insufficient documentation

## 2017-09-23 DIAGNOSIS — I480 Paroxysmal atrial fibrillation: Secondary | ICD-10-CM

## 2017-09-23 DIAGNOSIS — M898X1 Other specified disorders of bone, shoulder: Secondary | ICD-10-CM

## 2017-09-23 DIAGNOSIS — G4733 Obstructive sleep apnea (adult) (pediatric): Secondary | ICD-10-CM | POA: Diagnosis not present

## 2017-09-23 DIAGNOSIS — I071 Rheumatic tricuspid insufficiency: Secondary | ICD-10-CM | POA: Insufficient documentation

## 2017-09-23 DIAGNOSIS — R0600 Dyspnea, unspecified: Secondary | ICD-10-CM | POA: Diagnosis present

## 2017-09-23 DIAGNOSIS — E785 Hyperlipidemia, unspecified: Secondary | ICD-10-CM | POA: Diagnosis not present

## 2017-09-23 DIAGNOSIS — R002 Palpitations: Secondary | ICD-10-CM | POA: Diagnosis not present

## 2017-09-24 ENCOUNTER — Telehealth (HOSPITAL_COMMUNITY): Payer: Self-pay

## 2017-09-24 NOTE — Telephone Encounter (Signed)
Encounter complete. 

## 2017-09-29 ENCOUNTER — Ambulatory Visit (HOSPITAL_COMMUNITY)
Admission: RE | Admit: 2017-09-29 | Discharge: 2017-09-29 | Disposition: A | Discharge status: Home / Self Care | Payer: Medicare Other | Source: Ambulatory Visit | Attending: Cardiology | Admitting: Cardiology

## 2017-09-29 DIAGNOSIS — M898X1 Other specified disorders of bone, shoulder: Secondary | ICD-10-CM

## 2017-09-29 DIAGNOSIS — R0609 Other forms of dyspnea: Secondary | ICD-10-CM | POA: Insufficient documentation

## 2017-09-29 DIAGNOSIS — I480 Paroxysmal atrial fibrillation: Secondary | ICD-10-CM | POA: Diagnosis not present

## 2017-09-29 DIAGNOSIS — G8929 Other chronic pain: Secondary | ICD-10-CM | POA: Insufficient documentation

## 2017-09-29 LAB — MYOCARDIAL PERFUSION IMAGING
CHL CUP NUCLEAR SRS: 0
CHL CUP NUCLEAR SSS: 0
CSEPPHR: 82 {beats}/min
LV sys vol: 27 mL
LVDIAVOL: 62 mL (ref 46–106)
Rest HR: 53 {beats}/min
SDS: 0
TID: 1.08

## 2017-09-29 MED ORDER — REGADENOSON 0.4 MG/5ML IV SOLN
0.4000 mg | Freq: Once | INTRAVENOUS | Status: AC
  Administered 2017-09-29: 0.4 mg via INTRAVENOUS

## 2017-09-29 MED ORDER — TECHNETIUM TC 99M TETROFOSMIN IV KIT
10.5000 | PACK | Freq: Once | INTRAVENOUS | Status: AC | PRN
  Administered 2017-09-29: 10.5 via INTRAVENOUS
  Filled 2017-09-29: qty 11

## 2017-09-29 MED ORDER — TECHNETIUM TC 99M TETROFOSMIN IV KIT
30.8000 | PACK | Freq: Once | INTRAVENOUS | Status: AC | PRN
  Administered 2017-09-29: 30.8 via INTRAVENOUS
  Filled 2017-09-29: qty 31

## 2017-10-03 ENCOUNTER — Other Ambulatory Visit: Payer: Self-pay | Admitting: Family Medicine

## 2017-10-03 DIAGNOSIS — J309 Allergic rhinitis, unspecified: Secondary | ICD-10-CM

## 2017-10-05 NOTE — Telephone Encounter (Signed)
Is this ok to refill?  Patient has appointment on 10-26-17

## 2017-10-20 ENCOUNTER — Other Ambulatory Visit: Payer: Self-pay | Admitting: Family Medicine

## 2017-10-20 DIAGNOSIS — E782 Mixed hyperlipidemia: Secondary | ICD-10-CM

## 2017-10-23 ENCOUNTER — Telehealth: Payer: Self-pay | Admitting: *Deleted

## 2017-10-23 ENCOUNTER — Other Ambulatory Visit: Payer: Medicare Other

## 2017-10-23 ENCOUNTER — Encounter: Payer: Self-pay | Admitting: *Deleted

## 2017-10-23 DIAGNOSIS — E782 Mixed hyperlipidemia: Secondary | ICD-10-CM

## 2017-10-23 DIAGNOSIS — Z5181 Encounter for therapeutic drug level monitoring: Secondary | ICD-10-CM

## 2017-10-23 DIAGNOSIS — E039 Hypothyroidism, unspecified: Secondary | ICD-10-CM

## 2017-10-23 NOTE — Telephone Encounter (Signed)
TSH (hypothyroidism) LFT and lipids--med monitor and hyperchol

## 2017-10-23 NOTE — Telephone Encounter (Signed)
Patient came in for labs and there were not any orders in system, I was figuring just TSH but wanted to double check with you, thanks.

## 2017-10-24 LAB — HEPATIC FUNCTION PANEL
ALT: 30 IU/L (ref 0–32)
AST: 31 IU/L (ref 0–40)
Albumin: 4.5 g/dL (ref 3.5–4.7)
Alkaline Phosphatase: 58 IU/L (ref 39–117)
BILIRUBIN TOTAL: 0.3 mg/dL (ref 0.0–1.2)
BILIRUBIN, DIRECT: 0.09 mg/dL (ref 0.00–0.40)
Total Protein: 6.4 g/dL (ref 6.0–8.5)

## 2017-10-24 LAB — LIPID PANEL
CHOLESTEROL TOTAL: 137 mg/dL (ref 100–199)
Chol/HDL Ratio: 2.6 ratio (ref 0.0–4.4)
HDL: 53 mg/dL (ref >39)
LDL CALC: 58 mg/dL (ref 0–99)
TRIGLYCERIDES: 131 mg/dL (ref 0–149)
VLDL Cholesterol Cal: 26 mg/dL (ref 5–40)

## 2017-10-24 LAB — TSH: TSH: 8.15 u[IU]/mL — AB (ref 0.450–4.500)

## 2017-10-25 NOTE — Progress Notes (Signed)
Chief Complaint  Patient presents with  . Hypothyroidism    nonfasting med check. Wants to discuss her statin medication today, wonders if in the mornings her morning stiffness could be caused by this vs arthritis.    Stiff getting out of bed in the morning, "can hardly get up", in her thighs. Wonders if this is related to arthritis, vs due to her statin. She doesn't really have pain in her muscles/joints at other times of day, just after prolonged sitting or in the morning.  Hypothyroidism: She takes her medication on an empty stomach, separate from food and other medications.  She denies missed pills.   Earlier this year, she had been on generic, and supplier had changed.  TSH was therefore rechecked in 06/2017, and was low at 0.256. Dose was decreased from 20mg to 557m and changed from the generic to branded Synthroid.  Follow-up TSH in June was 4.070. She wasn't having symptoms at that time, so we kept the dose at 508mand did close follow-up. She had labs done prior to today's visit, see below.  She does report fatigue recently. Bowels are back to being loose again--does better when she eats smaller portions. Denies constipation. Denies changes to weight, skin (slightly drier on arms), nails (has had chronic trouble with nails), moods. Continues to use Lactaid with meals, and takes a probiotic daily.  Family member suggested taking Welchol prn for diarrhea. She sees Dr. MagWatt Climesr GI (not seen since colonoscopy).  Mixed hyperlipidemia: Pravastatin was changed to Crestor last year (wasn't at goal for TG; previously didn't tolerate lipitor); last check was at goal (after initially still having mildly elevated TG).  She had labs prior to today's visit, see below. She continues on fish oil (3/d),  and tries to follow lowfat, low cholesterol diet. She is also taking Coenzyme Q10 daily.  Hasaching in her legs which feels better when she wears compression stockings.She has some stinging in the  bottom of her feetat the end of the day--when she wears the compression socks, feel rough, and moisturizing at night helps. She has been using a topical salve called Magnilife DB every other night which helps a lot with this discomfort.  She reports this all still remains true.  Atrial fibrillation: She remains on blood thinners without any bleeding or complications. She sees Dr. JorMartiniqueery 6 months, last in July.She had echo and perfusion study in July, no changes noted. No med changes were made.She denies tachycardia, no known episodes of atrial fibrillation. She sometimes notices the heart beating a little faster than normal in the morning, short-lived.  Blood pressure and pulse have been normal. She denies chest pain, shortness of breath. Sometimes slightly winded after walking up 3 flights of stairs--unchanged.  Gets some back pain with cleaning, needs to rest for a while.  HTN: Occasionally checks BP, runs 100-115/65-70. Pulse iis usually around 60's.No headaches, dizziness, chest pain. She needs to get up slowly in the morning, to avoid dizziness.  Sometimes notes slight dizziness (vertigo) while laying in bed at night (with CPAP on), very mild.  OSA: She is using CPAP, and doing well, under the care of Dr. SooHalford Chessmanhe feels more refreshed since being on CPAP.   Allergies--She uses claritin and flonase with fairly good results.  She uses saline regularly. She has had some spotting from the nose (not full nosebleed, more dried/crusting), has some sores in her nose. Has a salve to use in her nose before using CPAP, which has helped.  She reports "having a lot of phlegm", and some chronic hoarseness. She feels like her hoarseness has gotten worse (though reports it was somewhat better with Flonase, but overall getting worse). She thinks it is worse when she is tired.  She is no longer having any problems with reflux.  Osteopenia--She restarted Boniva after her last bone density 04/2014.  Denies any reflux, chest pain or other side effects.LastDEXAwas 05/2016, T-1.6 in L fem neck, not significantly changed from2/2016: At one time shedidn't take the Boniva for a month, held it to see if Jaclyn Prime was contributing to her bony complaints. She didn't notice a difference, so resumed taking it.At one point she stopped taking it for about 4 months (she didn't feel like it was helping, since the follow-up DEXA looked the same). Not stopped due to side effects or problems. She restarted monthly Boniva in 10/2016, and denies side effects, has been compliant in taking med over the last year.  PMH, [place] SH reviewed  Outpatient Encounter Medications as of 10/26/2017  Medication Sig Note  . apixaban (ELIQUIS) 2.5 MG TABS tablet Take 1 tablet (2.5 mg total) 2 (two) times daily by mouth.   . Calcium Carbonate-Vitamin D (CALCIUM 600 + D PO) Take 2 tablets by mouth daily.     Marland Kitchen co-enzyme Q-10 30 MG capsule Take 30 mg by mouth daily.     . fluticasone (FLONASE) 50 MCG/ACT nasal spray USE 2 SPRAY(S) IN EACH NOSTRIL ONCE DAILY   . ibandronate (BONIVA) 150 MG tablet TAKE 1 TABLET BY MOUTH ONCE EVERY MONTH, TAKE WITH A FULL GLASS OF WATER AND SO NOT EAT OR LAY DOWN FOR 30 MINUTES   . lactase (LACTAID) 3000 UNITS tablet Take 3,000 Units by mouth 3 (three) times daily with meals.   . Lactobacillus (PROBIOTIC ACIDOPHILUS PO) Take 1 tablet by mouth every morning.   . loratadine (CLARITIN) 10 MG tablet Take 10 mg by mouth daily.   . Misc Natural Products (OSTEO BI-FLEX JOINT SHIELD PO) Take 2 tablets by mouth daily.   . Multiple Vitamin (MULTI-VITAMIN) tablet Take 1 tablet by mouth daily.     . Multiple Vitamins-Minerals (ICAPS PO) Take 1 tablet by mouth daily.   . Omega-3 Fatty Acids (FISH OIL OMEGA-3 PO) Take 3,600 mg by mouth daily.  08/10/2017: 3679m  . PREVIDENT 5000 PLUS 1.1 % CREA dental cream See admin instructions.   . rosuvastatin (CRESTOR) 20 MG tablet TAKE 1 TABLET BY MOUTH ONCE DAILY   .  [DISCONTINUED] metoprolol tartrate (LOPRESSOR) 25 MG tablet TAKE 1 TABLET BY MOUTH TWICE DAILY   . [DISCONTINUED] SYNTHROID 50 MCG tablet Take 1 tablet (50 mcg total) by mouth daily before breakfast.   . cholestyramine (QUESTRAN) 4 g packet Take 1 packet (4 g total) by mouth 3 (three) times daily with meals.   . Loperamide HCl (IMODIUM PO) Take 1 tablet by mouth as needed.   .Marland KitchenSYNTHROID 75 MCG tablet Take 1 tablet (75 mcg total) by mouth daily before breakfast.   . [DISCONTINUED] Blood Pressure Monitoring KIT Check B/P daily    No facility-administered encounter medications on file as of 10/26/2017.    No Known Allergies  ROS: no fever, chills, URI symptoms. +allergies/congestion/PND per HPI. +fatigue. +diarrhea, see HPI. No bleeding, bruising, rash, chest pain. Moods are good.  Some recent R hip pain, thinks she may have pulled a muscle when using equipment at the gym.  Morning stiffness in thighs. No urinary complaints.  PHYSICAL EXAM:  BP 118/62  Pulse 60   Ht 4' 9.5" (1.461 m)   Wt 130 lb (59 kg)   BMI 27.64 kg/m   Wt Readings from Last 3 Encounters:  10/26/17 130 lb (59 kg)  09/29/17 129 lb (58.5 kg)  09/14/17 129 lb 3.2 oz (58.6 kg)    Well appearing, pleasant female in no distress HEENT: conjunctiva and sclera are clear, EOMI, OP is clear, dentures. Nasal mucosa is mildly edematous with clear mucus on the left. Sinuses are nontender. Neck: no lymphadenopathy or thyromegaly Heart: regular rate and rhythm Lungs: clear bilaterally Back: no CVA or spinal tenderness Extremities: no edema, normal pulses. Slight discomfort with external rotation of R hip.  Neuro: alert and oriented, cranial nerves intact. Normal gait Psych: normal mood, affect, hygiene and grooming Skin: normal turgor, no rash/lesions   Lab Results  Component Value Date   TSH 8.150 (H) 10/23/2017   Lab Results  Component Value Date   CHOL 137 10/23/2017   HDL 53 10/23/2017   LDLCALC 58 10/23/2017    TRIG 131 10/23/2017   CHOLHDL 2.6 10/23/2017   Lab Results  Component Value Date   ALT 30 10/23/2017   AST 31 10/23/2017   ALKPHOS 58 10/23/2017   BILITOT 0.3 10/23/2017    ASSESSMENT/PLAN:  Postablative hypothyroidism - TSH elevated; increase Synthroid dose back to 37mg. recheck 6-8 wks - Plan: SYNTHROID 75 MCG tablet, TSH  Mixed hyperlipidemia - cont current regimen  Paroxysmal atrial fibrillation (HCC) - cont anticoagulation  Obstructive sleep apnea - cont CPAP use  Osteopenia of neck of left femur - continue Boniva, Ca, D, wt bearing exercise  Chronic diarrhea - trial of Questran with meals. to f/u with Dr. MWatt Climesif not improving - Plan: cholestyramine (QUESTRAN) 4 g packet  Chronic hoarseness - suspect related to allergies/PND (no longer reflux contributing); given worsening symptoms, will refer to ENT to r/o other causes - Plan: Ambulatory referral to ENT   Increase Synthroid back to 72m. Lab visit for TSH in 2 months.  Try Questran packets to see if they help with diarrhea. If diarrhea persists, please make an appointment to follow up with Dr. MaWatt Climes Worsening hoarseness, suspect related to ongoing allergies/drainage (Flonase did help some). Discussed seeing ENT for further evaluation of her hoarseness.  She is interested in this referral, feels like it is worse.  F/u 6 mos for CPE 6-8 weeks for lab visit for TSH

## 2017-10-26 ENCOUNTER — Ambulatory Visit (INDEPENDENT_AMBULATORY_CARE_PROVIDER_SITE_OTHER): Payer: Medicare Other | Admitting: Family Medicine

## 2017-10-26 ENCOUNTER — Encounter: Payer: Self-pay | Admitting: Family Medicine

## 2017-10-26 ENCOUNTER — Other Ambulatory Visit: Payer: Self-pay | Admitting: Cardiovascular Disease

## 2017-10-26 VITALS — BP 118/62 | HR 60 | Ht <= 58 in | Wt 130.0 lb

## 2017-10-26 DIAGNOSIS — M85852 Other specified disorders of bone density and structure, left thigh: Secondary | ICD-10-CM

## 2017-10-26 DIAGNOSIS — K529 Noninfective gastroenteritis and colitis, unspecified: Secondary | ICD-10-CM | POA: Diagnosis not present

## 2017-10-26 DIAGNOSIS — E890 Postprocedural hypothyroidism: Secondary | ICD-10-CM | POA: Diagnosis not present

## 2017-10-26 DIAGNOSIS — I480 Paroxysmal atrial fibrillation: Secondary | ICD-10-CM

## 2017-10-26 DIAGNOSIS — G4733 Obstructive sleep apnea (adult) (pediatric): Secondary | ICD-10-CM

## 2017-10-26 DIAGNOSIS — R490 Dysphonia: Secondary | ICD-10-CM | POA: Diagnosis not present

## 2017-10-26 DIAGNOSIS — E782 Mixed hyperlipidemia: Secondary | ICD-10-CM | POA: Diagnosis not present

## 2017-10-26 MED ORDER — CHOLESTYRAMINE 4 G PO PACK: 4.0000 g | PACK | Freq: Three times a day (TID) | ORAL | 2 refills | Status: DC

## 2017-10-26 MED ORDER — SYNTHROID 75 MCG PO TABS: 75.0000 ug | ORAL_TABLET | Freq: Every day | ORAL | 2 refills | Status: DC

## 2017-10-26 NOTE — Patient Instructions (Signed)
  Increase Synthroid back to 75mcg. We sent new prescription to your pharmacy. Return for a lab visit for TSH in 2 months.  Try Questran packets to see if they help with diarrhea. Take prior to your main meals. If diarrhea persists, please make an appointment to follow up with Dr. Ewing SchleinMagod.  We are referring you to Dr. Suszanne Connerseoh, an ENT, for evaluation of your worsening hoarseness.

## 2017-11-17 DIAGNOSIS — H353131 Nonexudative age-related macular degeneration, bilateral, early dry stage: Secondary | ICD-10-CM | POA: Diagnosis not present

## 2017-11-17 DIAGNOSIS — H40012 Open angle with borderline findings, low risk, left eye: Secondary | ICD-10-CM | POA: Diagnosis not present

## 2017-11-17 DIAGNOSIS — H401111 Primary open-angle glaucoma, right eye, mild stage: Secondary | ICD-10-CM | POA: Diagnosis not present

## 2017-11-17 DIAGNOSIS — Z961 Presence of intraocular lens: Secondary | ICD-10-CM | POA: Diagnosis not present

## 2017-11-18 ENCOUNTER — Encounter: Payer: Self-pay | Admitting: Pulmonary Disease

## 2017-11-18 ENCOUNTER — Ambulatory Visit (INDEPENDENT_AMBULATORY_CARE_PROVIDER_SITE_OTHER): Payer: Medicare Other | Admitting: Pulmonary Disease

## 2017-11-18 VITALS — BP 118/68 | HR 64 | Ht <= 58 in | Wt 131.8 lb

## 2017-11-18 DIAGNOSIS — Z9989 Dependence on other enabling machines and devices: Secondary | ICD-10-CM

## 2017-11-18 DIAGNOSIS — G4733 Obstructive sleep apnea (adult) (pediatric): Secondary | ICD-10-CM

## 2017-11-18 DIAGNOSIS — J310 Chronic rhinitis: Secondary | ICD-10-CM | POA: Diagnosis not present

## 2017-11-18 NOTE — Patient Instructions (Signed)
Will arrange for referral to new home care company to work with Mardelle MatteAndy, and get new auto CPAP machine and mask refit  Follow up in 2 months after getting new machine

## 2017-11-18 NOTE — Progress Notes (Signed)
[facility]  Chief Complaint  Patient presents with  . Follow-up    states CPAP can be replaced with new one, sleeps well at night, but has to adjust mask often     Constitutional: BP 118/68 (BP Location: Right Arm, Cuff Size: Normal)   Pulse 64   Ht 4\' 10"  (1.473 m)   Wt 131 lb 12.8 oz (59.8 kg)   SpO2 95%   BMI 27.55 kg/m   History of Present Illness: Abigail Day is a 84 y.o. female with obstructive sleep apnea.  She wants a new machine.  Her current device is more than 82 yrs old.  She has trouble with nasal irritation and air leak depending on her mask she is using.  She has nasal cushion and nasal pillows.  Nasal cushion is more comfortable, but she thinks her current size is too big.  She has appointment with ENT.  She has persistent runny nose and hoarseness.   Comprehensive Respiratory Exam:  Appearance - well kempt  ENMT - nasal mucosa moist, turbinates clear, midline nasal septum, mild erosions on nasal septum, wears dentures, no gingival bleeding, no oral exudates, no tonsillar hypertrophy Neck - no masses, trachea midline, no thyromegaly, no elevation in JVP Respiratory - normal appearance of chest wall, normal respiratory effort w/o accessory muscle use, no dullness on percussion,, no wheezing or rales CV - s1s2 regular rate and rhythm, no murmurs, no peripheral edema, radial pulses symmetric GI - soft, non tender Lymph - no adenopathy noted in neck and axillary areas MSK - normal muscle strength and tone, normal gait Ext - no cyanosis, clubbing, or joint inflammation noted Skin - no rashes, lesions, or ulcers Neuro - oriented to person, place, and time Psych - normal mood and affect   Assessment/Plan:  Obstructive sleep apnea. - she is compliant with CPAP and reports benefit - will arrange for new CPAP machine since her current device is more than 82 yrs old - will get mask refit - continue auto CPAP - will change DMEs  to [facility]  CPAP rhinitis. - she has appt with ENT later this month   Patient Instructions  Will arrange for referral to new home care company to work with Mardelle Matte, and get new auto CPAP machine and mask refit  Follow up in 2 months after getting new machine    Coralyn Helling, MD [facility]/Critical Care 11/18/2017, 9:27 AM  Flow Sheet  Sleep tests: PSG 08/18/12 >> AHI 23, SpO2 low 81% Auto CPAP 10/19/17 to 11/17/17 >> used on 30  Of 30 nights with average 7 hrs 23 min.  Average AHI 1.9 with median CPAP 9 and 95 th percentile CPAP 12 cm H2O.  Air leak.  Past Medical History: She  has a past medical history of Diverticulosis, Dyspnea on exertion (02/18/2012), Eye exam abnormal (05/08/15), Glaucoma suspect, Hemorrhoids, Hemorrhoids (09/25/10), Hyperplastic colon polyp (09/25/10), Osteopenia, Palpitations (02/18/2012), Paroxysmal atrial fibrillation (HCC), Pure hypercholesterolemia, Sleep apnea, Unspecified hypothyroidism, and Urge urinary incontinence.  Past Surgical History: She  has a past surgical history that includes Abdominal hysterectomy (age 38); Eye surgery; Colonoscopy (11/06, 09/25/10, 12/2015); Laparoscopic Nissen fundoplication (8/07); and Esophagogastroduodenoscopy (09/25/10).  Family History: Her family history includes Aortic aneurysm in her sister; Breast cancer (age of onset: 93) in her sister; Colon cancer (age of onset: 54) in her sister; Diabetes in her brother, brother, and father; Heart disease in her brother, brother, brother, brother, brother, brother, brother, father, mother, sister, sister,  sister, sister, sister, and sister; Hyperlipidemia in her sister; Marfan syndrome in her sister; Peripheral Artery Disease in her sister; Stroke in her sister.  Social History: She  reports that she has never smoked. She has never used smokeless tobacco. She reports that she drinks alcohol. She reports that she does not use drugs.  Medications: Allergies as of 11/18/2017   No  Known Allergies     Medication List        Accurate as of 11/18/17  9:27 AM. Always use your most recent med list.          apixaban 2.5 MG Tabs tablet Commonly known as:  ELIQUIS Take 1 tablet (2.5 mg total) 2 (two) times daily by mouth.   CALCIUM 600 + D PO Take 2 tablets by mouth daily.   cholestyramine 4 g packet Commonly known as:  QUESTRAN Take 1 packet (4 g total) by mouth 3 (three) times daily with meals.   co-enzyme Q-10 30 MG capsule Take 30 mg by mouth daily.   FISH OIL OMEGA-3 PO Take 3,600 mg by mouth daily.   fluticasone 50 MCG/ACT nasal spray Commonly known as:  FLONASE USE 2 SPRAY(S) IN EACH NOSTRIL ONCE DAILY   ibandronate 150 MG tablet Commonly known as:  BONIVA TAKE 1 TABLET BY MOUTH ONCE EVERY MONTH, TAKE WITH A FULL GLASS OF WATER AND SO NOT EAT OR LAY DOWN FOR 30 MINUTES   ICAPS PO Take 1 tablet by mouth daily.   IMODIUM PO Take 1 tablet by mouth as needed.   lactase 3000 units tablet Commonly known as:  LACTAID Take 3,000 Units by mouth 3 (three) times daily with meals. As needed   loratadine 10 MG tablet Commonly known as:  CLARITIN Take 10 mg by mouth daily.   metoprolol tartrate 25 MG tablet Commonly known as:  LOPRESSOR TAKE 1 TABLET BY MOUTH TWICE DAILY   MULTI-VITAMIN tablet Take 1 tablet by mouth daily.   OSTEO BI-FLEX JOINT SHIELD PO Take 2 tablets by mouth daily.   PREVIDENT 5000 PLUS 1.1 % Crea dental cream Generic drug:  sodium fluoride See admin instructions.   PROBIOTIC ACIDOPHILUS PO Take 1 tablet by mouth every morning.   rosuvastatin 20 MG tablet Commonly known as:  CRESTOR TAKE 1 TABLET BY MOUTH ONCE DAILY   SYNTHROID 75 MCG tablet Generic drug:  levothyroxine Take 1 tablet (75 mcg total) by mouth daily before breakfast.

## 2017-11-24 DIAGNOSIS — K219 Gastro-esophageal reflux disease without esophagitis: Secondary | ICD-10-CM | POA: Diagnosis not present

## 2017-11-24 DIAGNOSIS — R490 Dysphonia: Secondary | ICD-10-CM | POA: Diagnosis not present

## 2017-11-24 HISTORY — PX: LARYNGOSCOPY: SHX5203

## 2017-11-30 ENCOUNTER — Other Ambulatory Visit: Payer: Self-pay | Admitting: Family Medicine

## 2017-11-30 DIAGNOSIS — J309 Allergic rhinitis, unspecified: Secondary | ICD-10-CM

## 2017-12-10 ENCOUNTER — Encounter: Payer: Self-pay | Admitting: *Deleted

## 2017-12-14 ENCOUNTER — Other Ambulatory Visit (INDEPENDENT_AMBULATORY_CARE_PROVIDER_SITE_OTHER): Payer: Medicare Other

## 2017-12-14 DIAGNOSIS — Z23 Encounter for immunization: Secondary | ICD-10-CM | POA: Diagnosis not present

## 2017-12-14 DIAGNOSIS — E890 Postprocedural hypothyroidism: Secondary | ICD-10-CM

## 2017-12-15 LAB — TSH: TSH: 1.8 u[IU]/mL (ref 0.450–4.500)

## 2017-12-16 ENCOUNTER — Other Ambulatory Visit: Payer: Self-pay | Admitting: *Deleted

## 2017-12-16 ENCOUNTER — Encounter: Payer: Self-pay | Admitting: *Deleted

## 2017-12-16 DIAGNOSIS — E890 Postprocedural hypothyroidism: Secondary | ICD-10-CM

## 2017-12-16 MED ORDER — SYNTHROID 75 MCG PO TABS: 75.0000 ug | ORAL_TABLET | Freq: Every day | ORAL | 0 refills | Status: DC

## 2018-01-05 DIAGNOSIS — R040 Epistaxis: Secondary | ICD-10-CM | POA: Diagnosis not present

## 2018-01-05 DIAGNOSIS — K219 Gastro-esophageal reflux disease without esophagitis: Secondary | ICD-10-CM | POA: Diagnosis not present

## 2018-01-05 DIAGNOSIS — R490 Dysphonia: Secondary | ICD-10-CM | POA: Diagnosis not present

## 2018-01-05 DIAGNOSIS — J310 Chronic rhinitis: Secondary | ICD-10-CM | POA: Diagnosis not present

## 2018-01-18 ENCOUNTER — Other Ambulatory Visit: Payer: Self-pay | Admitting: Family Medicine

## 2018-01-18 ENCOUNTER — Other Ambulatory Visit: Payer: Self-pay | Admitting: Cardiology

## 2018-01-18 DIAGNOSIS — E782 Mixed hyperlipidemia: Secondary | ICD-10-CM

## 2018-01-21 ENCOUNTER — Other Ambulatory Visit: Payer: Self-pay | Admitting: Family Medicine

## 2018-01-21 DIAGNOSIS — Z1231 Encounter for screening mammogram for malignant neoplasm of breast: Secondary | ICD-10-CM

## 2018-01-22 ENCOUNTER — Other Ambulatory Visit: Payer: Self-pay | Admitting: Family Medicine

## 2018-01-22 DIAGNOSIS — J309 Allergic rhinitis, unspecified: Secondary | ICD-10-CM

## 2018-02-17 ENCOUNTER — Ambulatory Visit (INDEPENDENT_AMBULATORY_CARE_PROVIDER_SITE_OTHER): Payer: Medicare Other | Admitting: Pulmonary Disease

## 2018-02-17 ENCOUNTER — Telehealth: Payer: Self-pay | Admitting: Pulmonary Disease

## 2018-02-17 ENCOUNTER — Encounter: Payer: Self-pay | Admitting: Pulmonary Disease

## 2018-02-17 VITALS — BP 122/74 | HR 65 | Ht 59.0 in | Wt 133.0 lb

## 2018-02-17 DIAGNOSIS — G4733 Obstructive sleep apnea (adult) (pediatric): Secondary | ICD-10-CM | POA: Diagnosis not present

## 2018-02-17 DIAGNOSIS — Z9989 Dependence on other enabling machines and devices: Secondary | ICD-10-CM

## 2018-02-17 NOTE — Patient Instructions (Signed)
Will call with CPAP download  Follow up in 1 year

## 2018-02-17 NOTE — Progress Notes (Signed)
[name]  Chief Complaint  Patient presents with  . Follow-up    Pt is new start on cpap machine with [facility]. Called them today for DL on patient not rec'd at this time. LVM with [facility] to enroll pt in airview today.    Constitutional:  BP 122/74 (BP Location: Left Arm, Cuff Size: Normal)   Pulse 65   Ht 4\' 11"  (1.499 m)   Wt 133 lb (60.3 kg)   SpO2 95%   BMI 26.86 kg/m   Past Medical History:  Hypothyroidism, HLD, PAF, Osteopenia, Colon polyp, Diverticulosis  Brief Summary:  Abigail Day is a 84 y.o. female with obstructive sleep apnea.  She has been doing well with CPAP.  She had to change to a different mask and this is working better.  Not having sinus congestion, sore throat, or aerophagia.  Gets dry mouth, but mild.  She was seen by Dr. Suszanne Connerseoh with ENT.  Told she might have reflux causing hoarseness.  Physical Exam:   Appearance - well kempt   ENMT - clear nasal mucosa, midline nasal  septum, no oral exudates, no LAN, trachea midline, hoarse voice  Respiratory - normal chest wall, normal respiratory effort, no accessory muscle use, no wheeze/rales  CV - s1s2 regular rate and rhythm, no murmurs, no peripheral edema, radial pulses symmetric  GI - soft, non tender, no masses  Lymph - no adenopathy noted in neck and axillary areas  MSK - normal gait  Ext - no cyanosis, clubbing, or joint inflammation noted  Skin - no rashes, lesions, or ulcers  Neuro - normal strength, oriented x 3  Psych - normal mood and affect   Assessment/Plan:   Obstructive sleep apnea. - she is compliant with CPAP and reports benefit - continue auto CPAP - will call with CPAP download  Hoarseness likely from reflux. - followed by ENT   Patient Instructions  Will call with CPAP download  Follow up in 1 year    Coralyn HellingVineet Jonathin Heinicke, MD [facility] Pager: [phone] 02/17/2018, 2:16 PM  Flow Sheet    Sleep tests:  PSG  08/18/12 >> AHI 23, SpO2 low 81% Auto CPAP 10/19/17 to 11/17/17 >> used on 30  Of 30 nights with average 7 hrs 23 min.  Average AHI 1.9 with median CPAP 9 and 95 th percentile CPAP 12 cm H2O.  Air leak.  Cardiac tests:  Echo 09/23/17 >> EF 60 to 65%, grade 1 DD, mild TR   Medications:   Allergies as of 02/17/2018   No Known Allergies     Medication List       Accurate as of February 17, 2018  2:16 PM. Always use your most recent med list.        apixaban 2.5 MG Tabs tablet Commonly known as:  ELIQUIS Take 1 tablet (2.5 mg total) 2 (two) times daily by mouth.   ELIQUIS 2.5 MG Tabs tablet Generic drug:  apixaban TAKE 1 TABLET BY MOUTH TWICE DAILY   CALCIUM 600 + D PO Take 2 tablets by mouth daily.   cholestyramine 4 g packet Commonly known as:  QUESTRAN Take 1 packet (4 g total) by mouth 3 (three) times daily with meals.   co-enzyme Q-10 30 MG capsule Take 30 mg by mouth daily.   FISH OIL OMEGA-3 PO Take 3,600 mg by mouth daily.   fluticasone 50 MCG/ACT nasal spray Commonly known as:  FLONASE USE 2 SPRAY(S) IN EACH NOSTRIL ONCE  DAILY   ibandronate 150 MG tablet Commonly known as:  BONIVA TAKE 1 TABLET BY MOUTH ONCE EVERY MONTH, TAKE WITH A FULL GLASS OF WATER AND SO NOT EAT OR LAY DOWN FOR 30 MINUTES   ICAPS PO Take 1 tablet by mouth daily.   IMODIUM PO Take 1 tablet by mouth as needed.   lactase 3000 units tablet Commonly known as:  LACTAID Take 3,000 Units by mouth 3 (three) times daily with meals. As needed   loratadine 10 MG tablet Commonly known as:  CLARITIN Take 10 mg by mouth daily.   metoprolol tartrate 25 MG tablet Commonly known as:  LOPRESSOR TAKE 1 TABLET BY MOUTH TWICE DAILY   MULTI-VITAMIN tablet Take 1 tablet by mouth daily.   OSTEO BI-FLEX JOINT SHIELD PO Take 2 tablets by mouth daily.   PREVIDENT 5000 PLUS 1.1 % Crea dental cream Generic drug:  sodium fluoride See admin instructions.   PROBIOTIC ACIDOPHILUS PO Take 1 tablet by  mouth every morning.   rosuvastatin 20 MG tablet Commonly known as:  CRESTOR TAKE 1 TABLET BY MOUTH ONCE DAILY   SYNTHROID 75 MCG tablet Generic drug:  levothyroxine Take 1 tablet (75 mcg total) by mouth daily before breakfast.       Past Surgical History:  She  has a past surgical history that includes Abdominal hysterectomy (age 63); Eye surgery; Colonoscopy (11/06, 09/25/10, 12/2015); Laparoscopic Nissen fundoplication (8/07); Esophagogastroduodenoscopy (09/25/10); and Laryngoscopy (11/24/2017).  Family History:  Her family history includes Aortic aneurysm in her sister; Breast cancer (age of onset: 44) in her sister; Colon cancer (age of onset: 73) in her sister; Diabetes in her brother, brother, and father; Heart disease in her brother, brother, brother, brother, brother, brother, brother, father, mother, sister, sister, sister, sister, sister, and sister; Hyperlipidemia in her sister; Marfan syndrome in her sister; Peripheral Artery Disease in her sister; Stroke in her sister.  Social History:  She  reports that she has never smoked. She has never used smokeless tobacco. She reports current alcohol use. She reports that she does not use drugs.

## 2018-02-17 NOTE — Telephone Encounter (Signed)
I have spoke with [facility] they state they did not recive new cpap order but we can resend it and get her started so we can get a compliance report.  Dr. Craige CottaSood is aware of this and nothing further is needed at this time.

## 2018-02-19 ENCOUNTER — Telehealth: Payer: Self-pay | Admitting: Pulmonary Disease

## 2018-02-19 NOTE — Telephone Encounter (Signed)
Thereasa DistanceRodney already had me on the phone about another patient and ask me about Corrine calling him back.  He states the patient was setup with new CPAP in 12/2017 with [facility]. Per Dr. Evlyn CourierSood's note from 11/18/2017 [facility] to work with Mardelle MatteAndy their RT. Order was placed at that time with [facility].

## 2018-02-19 NOTE — Telephone Encounter (Signed)
Called Rodney with [facility] unable to reach left message to give us a call back. 

## 2018-03-05 ENCOUNTER — Ambulatory Visit
Admission: RE | Admit: 2018-03-05 | Discharge: 2018-03-05 | Disposition: A | Discharge status: Home / Self Care | Payer: Medicare Other | Source: Ambulatory Visit | Attending: Family Medicine | Admitting: Family Medicine

## 2018-03-05 DIAGNOSIS — Z1231 Encounter for screening mammogram for malignant neoplasm of breast: Secondary | ICD-10-CM

## 2018-03-13 NOTE — Progress Notes (Signed)
Abigail Day Date of Birth: Nov 20, 1933 Medical Record #[id number]  History of Present Illness: Abigail Day is seen for follow up of Afib. She has a history of paroxysmal atrial fib - on Eliquis. EF is normal per echo back in December of 2013. Normal Myoview in June 2015. Other issues include HLD, hypothyroidism with prior ablation for hyperthyroidism, and diverticulosis. She is  on CPAP therapy for sleep apnea-followed by pulmonary. On her last visit she had some scapular pain and dyspnea. We repeated an Echo and Myoview study which were unremarkable.  On follow up today she states she is doing well. She has developed hoarseness and was told this is related to acid reflux. Now on Prilosec.  Her breathing is doing well. No chest pain. No edema. Rarely feels HR speed up at night.  She still exercises regularly.  She is using CPAP.    Current Outpatient Medications on File Prior to Visit  Medication Sig Dispense Refill  . Calcium Carbonate-Vitamin D (CALCIUM 600 + D PO) Take 2 tablets by mouth daily.      . cholestyramine (QUESTRAN) 4 g packet Take 1 packet (4 g total) by mouth 3 (three) times daily with meals. 60 each 2  . co-enzyme Q-10 30 MG capsule Take 30 mg by mouth daily.      Marland Kitchen ELIQUIS 2.5 MG TABS tablet TAKE 1 TABLET BY MOUTH TWICE DAILY 180 tablet 1  . fluticasone (FLONASE) 50 MCG/ACT nasal spray USE 2 SPRAY(S) IN EACH NOSTRIL ONCE DAILY 16 g 0  . ibandronate (BONIVA) 150 MG tablet TAKE 1 TABLET BY MOUTH ONCE EVERY MONTH, TAKE WITH A FULL GLASS OF WATER AND SO NOT EAT OR LAY DOWN FOR 30 MINUTES 3 tablet 3  . lactase (LACTAID) 3000 UNITS tablet Take 3,000 Units by mouth 3 (three) times daily with meals. As needed    . loratadine (CLARITIN) 10 MG tablet Take 10 mg by mouth daily.    . metoprolol tartrate (LOPRESSOR) 25 MG tablet TAKE 1 TABLET BY MOUTH TWICE DAILY 180 tablet 1  . Misc Natural Products (OSTEO BI-FLEX JOINT SHIELD PO) Take 2 tablets by mouth daily.    . Multiple Vitamin  (MULTI-VITAMIN) tablet Take 1 tablet by mouth daily.      . Multiple Vitamins-Minerals (ICAPS PO) Take 1 tablet by mouth daily.    . Omega-3 Fatty Acids (FISH OIL OMEGA-3 PO) Take 3,600 mg by mouth daily.     Marland Kitchen omeprazole (PRILOSEC) 10 MG capsule Take 10 mg by mouth 2 (two) times daily.    Marland Kitchen PREVIDENT 5000 PLUS 1.1 % CREA dental cream See admin instructions.  3  . rosuvastatin (CRESTOR) 20 MG tablet TAKE 1 TABLET BY MOUTH ONCE DAILY 90 tablet 0  . SYNTHROID 75 MCG tablet Take 1 tablet (75 mcg total) by mouth daily before breakfast. 90 tablet 0   No current facility-administered medications on file prior to visit.     No Known Allergies  Past Medical History:  Diagnosis Date  . Diverticulosis   . Dyspnea on exertion 02/18/2012  . Eye exam abnormal 05/08/15   Dr.Groat-glaucoma suspect  . Glaucoma suspect   . Hemorrhoids    internal and external  . Hemorrhoids 09/25/10   internal and external  . Hyperplastic colon polyp 09/25/10   Dr. Ewing Schlein  . Osteopenia   . Palpitations 02/18/2012  . Paroxysmal atrial fibrillation (HCC)   . Pure hypercholesterolemia   . Sleep apnea   . Unspecified hypothyroidism  s/p ablation for hyperthyroidism  . Urge urinary incontinence     Past Surgical History:  Procedure Laterality Date  . ABDOMINAL HYSTERECTOMY  age 85   with BSO and bladder tack  . COLONOSCOPY  11/06, 09/25/10, 12/2015   hyperplastic polyp 2012;   . ESOPHAGOGASTRODUODENOSCOPY  09/25/10   tortuous esophagus, intact Nissen fundoplication, dilated the spasm at esophageal GE junction  . EYE SURGERY     bilateral cataract surgery  . LAPAROSCOPIC NISSEN FUNDOPLICATION  8/07   Dr. Daphine DeutscherMartin  . LARYNGOSCOPY  11/24/2017   dx.GERD,start omeprazole    Social History   Tobacco Use  Smoking Status Never Smoker  Smokeless Tobacco Never Used    Social History   Substance and Sexual Activity  Alcohol Use Yes  . Alcohol/week: 0.0 standard drinks   Comment: once a month or less     Family History  Problem Relation Age of Onset  . Heart disease Mother   . Heart disease Father   . Diabetes Father   . Marfan syndrome Sister   . Heart disease Sister        valve replacement  . Heart disease Brother   . Diabetes Brother   . Heart disease Brother   . Diabetes Brother   . Heart disease Brother   . Heart disease Brother   . Heart disease Brother        CABG  . Heart disease Brother        CABG  . Heart disease Sister        pacemaker  . Aortic aneurysm Sister   . Peripheral Artery Disease Sister        amputation of toes  . Breast cancer Sister [id number]  . Heart disease Sister        CABG at 3980  . Colon cancer Sister 588  . Heart disease Sister        CABG  . Heart disease Sister   . Heart disease Brother   . Heart disease Sister   . Hyperlipidemia Sister   . Stroke Sister     Review of Systems: The review of systems is per the HPI.  All other systems were reviewed and are negative.  Physical Exam: BP 114/68   Pulse 68   Ht 4\' 10"  (1.473 m)   Wt 129 lb (58.5 kg)   BMI 26.96 kg/m  GENERAL:  [name] in NAD HEENT:  PERRL, EOMI, sclera are clear. Oropharynx is clear. NECK:  No jugular venous distention, carotid upstroke brisk and symmetric, no bruits, no thyromegaly or adenopathy LUNGS:  Clear to auscultation bilaterally CHEST:  Unremarkable HEART:  RRR,  PMI not displaced or sustained,S1 and S2 within normal limits, no S3, no S4: no clicks, no rubs, no murmurs ABD:  Soft, nontender. BS +, no masses or bruits. No hepatomegaly, no splenomegaly EXT:  2 + pulses throughout, no edema, no cyanosis no clubbing SKIN:  Warm and dry.  No rashes NEURO:  Alert and oriented x 3. Cranial nerves II through XII intact. PSYCH:  Cognitively intact    LABORATORY DATA:     Lab Results  Component Value Date   WBC 3.9 04/23/2017   HGB 12.8 04/23/2017   HCT 37.2 04/23/2017   PLT 233 04/23/2017   GLUCOSE 85 04/23/2017   CHOL 137  10/23/2017   TRIG 131 10/23/2017   HDL 53 10/23/2017   LDLCALC 58 10/23/2017   ALT 30 10/23/2017   AST 31 10/23/2017  NA 142 04/23/2017   K 4.5 04/23/2017   CL 104 04/23/2017   CREATININE 0.84 04/23/2017   BUN 17 04/23/2017   CO2 23 04/23/2017   TSH 1.800 12/14/2017   HGBA1C 5.4 09/24/2015    Echo 09/23/17: Study Conclusions  - Left ventricle: The cavity size was normal. Wall thickness was   normal. Systolic function was normal. The estimated ejection   fraction was in the range of 60% to 65%. Doppler parameters are   consistent with abnormal left ventricular relaxation (grade 1   diastolic dysfunction). The Ee&' ratio is between 8-15, suggesting   indeterminate LV filling pressure. - Left atrium: The atrium was normal in size. - Tricuspid valve: There was mild regurgitation. - Pulmonary arteries: PA peak pressure: 27 mm Hg (S). - Inferior vena cava: The vessel was normal in size. The   respirophasic diameter changes were in the normal range (>= 50%),   consistent with normal central venous pressure.  Impressions:  - Compared to a prior study in 2013, the LVEF is unchanged. Left   and right atria measure normal in size.  Myoview 09/29/17: Study Highlights     Nuclear stress EF: 55%.  No T wave inversion was noted during stress.  There was no ST segment deviation noted during stress.  The study is normal.  This is a low risk study.   Normal perfusion. LVEF 55% with normal wall motion. This is a low risk study. No change compared to prior study.     Assessment / Plan:  1. Obstructive sleep apnea. On CPAP therapy  2. PAF - treated with rate control with metoprolol and Eliquis.  Will continue with current rate control strategy.   3. Hypercholesterolemia. Most recent lipid panel showed excellent LDL of 63 on Crestor.  4. DOE and scapular pain. Echo and Myoview studies in July 2019 were unremarkable.   Follow up in 6 months.

## 2018-03-15 ENCOUNTER — Ambulatory Visit (INDEPENDENT_AMBULATORY_CARE_PROVIDER_SITE_OTHER): Payer: Medicare Other | Admitting: Cardiology

## 2018-03-15 ENCOUNTER — Encounter: Payer: Self-pay | Admitting: Cardiology

## 2018-03-15 VITALS — BP 114/68 | HR 68 | Ht <= 58 in | Wt 129.0 lb

## 2018-03-15 DIAGNOSIS — I480 Paroxysmal atrial fibrillation: Secondary | ICD-10-CM | POA: Diagnosis not present

## 2018-03-15 DIAGNOSIS — E782 Mixed hyperlipidemia: Secondary | ICD-10-CM

## 2018-03-15 DIAGNOSIS — R0609 Other forms of dyspnea: Secondary | ICD-10-CM

## 2018-03-16 DIAGNOSIS — K219 Gastro-esophageal reflux disease without esophagitis: Secondary | ICD-10-CM | POA: Diagnosis not present

## 2018-03-16 DIAGNOSIS — R490 Dysphonia: Secondary | ICD-10-CM | POA: Diagnosis not present

## 2018-03-20 ENCOUNTER — Other Ambulatory Visit: Payer: Self-pay | Admitting: Family Medicine

## 2018-03-20 DIAGNOSIS — J309 Allergic rhinitis, unspecified: Secondary | ICD-10-CM

## 2018-03-20 DIAGNOSIS — E890 Postprocedural hypothyroidism: Secondary | ICD-10-CM

## 2018-04-15 ENCOUNTER — Other Ambulatory Visit: Payer: Self-pay | Admitting: Family Medicine

## 2018-04-15 DIAGNOSIS — E782 Mixed hyperlipidemia: Secondary | ICD-10-CM

## 2018-04-21 DIAGNOSIS — R490 Dysphonia: Secondary | ICD-10-CM | POA: Diagnosis not present

## 2018-04-21 DIAGNOSIS — J383 Other diseases of vocal cords: Secondary | ICD-10-CM | POA: Diagnosis not present

## 2018-04-21 DIAGNOSIS — K219 Gastro-esophageal reflux disease without esophagitis: Secondary | ICD-10-CM | POA: Diagnosis not present

## 2018-04-21 DIAGNOSIS — J3801 Paralysis of vocal cords and larynx, unilateral: Secondary | ICD-10-CM | POA: Diagnosis not present

## 2018-04-29 ENCOUNTER — Other Ambulatory Visit: Payer: Self-pay | Admitting: Cardiovascular Disease

## 2018-04-29 NOTE — Telephone Encounter (Signed)
Rx(s) sent to pharmacy electronically.  

## 2018-05-19 DIAGNOSIS — H401111 Primary open-angle glaucoma, right eye, mild stage: Secondary | ICD-10-CM | POA: Diagnosis not present

## 2018-05-19 DIAGNOSIS — H02834 Dermatochalasis of left upper eyelid: Secondary | ICD-10-CM | POA: Diagnosis not present

## 2018-05-19 DIAGNOSIS — H40012 Open angle with borderline findings, low risk, left eye: Secondary | ICD-10-CM | POA: Diagnosis not present

## 2018-05-19 DIAGNOSIS — Z961 Presence of intraocular lens: Secondary | ICD-10-CM | POA: Diagnosis not present

## 2018-05-19 DIAGNOSIS — H353131 Nonexudative age-related macular degeneration, bilateral, early dry stage: Secondary | ICD-10-CM | POA: Diagnosis not present

## 2018-05-19 DIAGNOSIS — H02831 Dermatochalasis of right upper eyelid: Secondary | ICD-10-CM | POA: Diagnosis not present

## 2018-05-19 DIAGNOSIS — H04123 Dry eye syndrome of bilateral lacrimal glands: Secondary | ICD-10-CM | POA: Diagnosis not present

## 2018-06-09 ENCOUNTER — Other Ambulatory Visit: Payer: Self-pay | Admitting: *Deleted

## 2018-06-09 DIAGNOSIS — Z7901 Long term (current) use of anticoagulants: Secondary | ICD-10-CM

## 2018-06-09 DIAGNOSIS — E782 Mixed hyperlipidemia: Secondary | ICD-10-CM

## 2018-06-09 DIAGNOSIS — Z5181 Encounter for therapeutic drug level monitoring: Secondary | ICD-10-CM

## 2018-06-09 DIAGNOSIS — E039 Hypothyroidism, unspecified: Secondary | ICD-10-CM

## 2018-06-14 ENCOUNTER — Other Ambulatory Visit: Payer: Medicare Other

## 2018-06-16 ENCOUNTER — Ambulatory Visit: Payer: Medicare Other | Admitting: Family Medicine

## 2018-06-17 ENCOUNTER — Other Ambulatory Visit: Payer: Self-pay | Admitting: Family Medicine

## 2018-06-17 DIAGNOSIS — E890 Postprocedural hypothyroidism: Secondary | ICD-10-CM

## 2018-07-09 ENCOUNTER — Other Ambulatory Visit: Payer: Self-pay | Admitting: Family Medicine

## 2018-07-09 ENCOUNTER — Other Ambulatory Visit: Payer: Self-pay | Admitting: Cardiology

## 2018-07-09 DIAGNOSIS — E782 Mixed hyperlipidemia: Secondary | ICD-10-CM

## 2018-07-09 NOTE — Telephone Encounter (Signed)
85 yo, 58.5kg, Scr 0.84 on 04/23/17 - will refill given COVID.  Last OV 03/15/18 Indication afib

## 2018-07-13 DIAGNOSIS — K219 Gastro-esophageal reflux disease without esophagitis: Secondary | ICD-10-CM | POA: Diagnosis not present

## 2018-07-13 DIAGNOSIS — J3801 Paralysis of vocal cords and larynx, unilateral: Secondary | ICD-10-CM | POA: Diagnosis not present

## 2018-07-13 DIAGNOSIS — J383 Other diseases of vocal cords: Secondary | ICD-10-CM | POA: Diagnosis not present

## 2018-07-13 DIAGNOSIS — R490 Dysphonia: Secondary | ICD-10-CM | POA: Diagnosis not present

## 2018-07-21 DIAGNOSIS — J3801 Paralysis of vocal cords and larynx, unilateral: Secondary | ICD-10-CM | POA: Diagnosis not present

## 2018-07-21 DIAGNOSIS — K219 Gastro-esophageal reflux disease without esophagitis: Secondary | ICD-10-CM | POA: Diagnosis not present

## 2018-07-21 DIAGNOSIS — J383 Other diseases of vocal cords: Secondary | ICD-10-CM | POA: Diagnosis not present

## 2018-07-21 DIAGNOSIS — R490 Dysphonia: Secondary | ICD-10-CM | POA: Diagnosis not present

## 2018-08-04 DIAGNOSIS — R490 Dysphonia: Secondary | ICD-10-CM | POA: Diagnosis not present

## 2018-08-04 DIAGNOSIS — J3801 Paralysis of vocal cords and larynx, unilateral: Secondary | ICD-10-CM | POA: Diagnosis not present

## 2018-08-04 DIAGNOSIS — K219 Gastro-esophageal reflux disease without esophagitis: Secondary | ICD-10-CM | POA: Diagnosis not present

## 2018-08-04 DIAGNOSIS — J383 Other diseases of vocal cords: Secondary | ICD-10-CM | POA: Diagnosis not present

## 2018-08-16 DIAGNOSIS — K219 Gastro-esophageal reflux disease without esophagitis: Secondary | ICD-10-CM | POA: Diagnosis not present

## 2018-08-16 DIAGNOSIS — J3801 Paralysis of vocal cords and larynx, unilateral: Secondary | ICD-10-CM | POA: Diagnosis not present

## 2018-08-16 DIAGNOSIS — J383 Other diseases of vocal cords: Secondary | ICD-10-CM | POA: Diagnosis not present

## 2018-08-16 DIAGNOSIS — R490 Dysphonia: Secondary | ICD-10-CM | POA: Diagnosis not present

## 2018-08-20 ENCOUNTER — Other Ambulatory Visit: Payer: Self-pay | Admitting: Family Medicine

## 2018-08-20 DIAGNOSIS — E782 Mixed hyperlipidemia: Secondary | ICD-10-CM

## 2018-08-30 ENCOUNTER — Other Ambulatory Visit: Payer: Medicare Other

## 2018-08-30 ENCOUNTER — Other Ambulatory Visit: Payer: Self-pay

## 2018-08-30 DIAGNOSIS — E782 Mixed hyperlipidemia: Secondary | ICD-10-CM

## 2018-08-30 DIAGNOSIS — Z5181 Encounter for therapeutic drug level monitoring: Secondary | ICD-10-CM

## 2018-08-30 DIAGNOSIS — Z7901 Long term (current) use of anticoagulants: Secondary | ICD-10-CM

## 2018-08-30 DIAGNOSIS — E039 Hypothyroidism, unspecified: Secondary | ICD-10-CM

## 2018-08-31 DIAGNOSIS — J3801 Paralysis of vocal cords and larynx, unilateral: Secondary | ICD-10-CM | POA: Diagnosis not present

## 2018-08-31 DIAGNOSIS — J383 Other diseases of vocal cords: Secondary | ICD-10-CM | POA: Diagnosis not present

## 2018-08-31 DIAGNOSIS — R490 Dysphonia: Secondary | ICD-10-CM | POA: Diagnosis not present

## 2018-08-31 DIAGNOSIS — K219 Gastro-esophageal reflux disease without esophagitis: Secondary | ICD-10-CM | POA: Diagnosis not present

## 2018-08-31 LAB — COMPREHENSIVE METABOLIC PANEL
ALT: 62 IU/L — ABNORMAL HIGH (ref 0–32)
AST: 31 IU/L (ref 0–40)
Albumin/Globulin Ratio: 2 (ref 1.2–2.2)
Albumin: 4.1 g/dL (ref 3.6–4.6)
Alkaline Phosphatase: 73 IU/L (ref 39–117)
BUN/Creatinine Ratio: 17 (ref 12–28)
BUN: 15 mg/dL (ref 8–27)
Bilirubin Total: 0.3 mg/dL (ref 0.0–1.2)
CO2: 20 mmol/L (ref 20–29)
Calcium: 9.6 mg/dL (ref 8.7–10.3)
Chloride: 105 mmol/L (ref 96–106)
Creatinine, Ser: 0.86 mg/dL (ref 0.57–1.00)
GFR calc Af Amer: 72 mL/min/{1.73_m2} (ref >59)
GFR calc non Af Amer: 63 mL/min/{1.73_m2} (ref >59)
Globulin, Total: 2.1 g/dL (ref 1.5–4.5)
Glucose: 96 mg/dL (ref 65–99)
Potassium: 4.5 mmol/L (ref 3.5–5.2)
Sodium: 142 mmol/L (ref 134–144)
Total Protein: 6.2 g/dL (ref 6.0–8.5)

## 2018-08-31 LAB — CBC WITH DIFFERENTIAL/PLATELET
Basophils Absolute: 0 10*3/uL (ref 0.0–0.2)
Basos: 1 %
EOS (ABSOLUTE): 0.2 10*3/uL (ref 0.0–0.4)
Eos: 3 %
Hematocrit: 34.5 % (ref 34.0–46.6)
Hemoglobin: 12.1 g/dL (ref 11.1–15.9)
Immature Grans (Abs): 0 10*3/uL (ref 0.0–0.1)
Immature Granulocytes: 1 %
Lymphocytes Absolute: 1.2 10*3/uL (ref 0.7–3.1)
Lymphs: 27 %
MCH: 33.7 pg — ABNORMAL HIGH (ref 26.6–33.0)
MCHC: 35.1 g/dL (ref 31.5–35.7)
MCV: 96 fL (ref 79–97)
Monocytes Absolute: 0.5 10*3/uL (ref 0.1–0.9)
Monocytes: 11 %
Neutrophils Absolute: 2.6 10*3/uL (ref 1.4–7.0)
Neutrophils: 57 %
Platelets: 303 10*3/uL (ref 150–450)
RBC: 3.59 x10E6/uL — ABNORMAL LOW (ref 3.77–5.28)
RDW: 12.1 % (ref 11.7–15.4)
WBC: 4.6 10*3/uL (ref 3.4–10.8)

## 2018-08-31 LAB — TSH: TSH: 0.708 u[IU]/mL (ref 0.450–4.500)

## 2018-08-31 LAB — LIPID PANEL
Chol/HDL Ratio: 3.4 ratio (ref 0.0–4.4)
Cholesterol, Total: 110 mg/dL (ref 100–199)
HDL: 32 mg/dL — ABNORMAL LOW (ref >39)
LDL Calculated: 45 mg/dL (ref 0–99)
Triglycerides: 167 mg/dL — ABNORMAL HIGH (ref 0–149)
VLDL Cholesterol Cal: 33 mg/dL (ref 5–40)

## 2018-08-31 NOTE — Progress Notes (Signed)
Chief Complaint  Patient presents with  . Medicare Wellness    nonfasting AWV with pelvic. Has been having urinary issues, feels like she has to go all the time and then when she goes feel like her bladder is not empty. Also has low back pain and some burning. Also wants to know if she should still take flonase-saw speech therapist and was told she should saline.    Abigail Day is a 85 y.o. female who presents for annual wellness visit and follow-up on chronic medical conditions. She had labs done prior to her visit, see below. She has the following concerns:  Last week (7-10 days ago) she started with symptoms of urinary frequency, feeling like she isn't emptying well. She is having burning with urination and some mid back pain bilaterally. No odor or blood in the urine.  No fever, had some chills early on (resolved), no nausea or vomiting.   Hypothyroidism: She takes her medication on an empty stomach, separate from food and other medications.  She denies missed pills. Last year her generic supplier had changed, needed some dose adjustments, but is now on Synthroid, at 72mg.  Dose increased from 531m in August, and recheck in 12/2017 was normal. She had labs rechecked prior to her visit today, see below. She denies changes in weight, hair/nail/skin or bowels. Some weight loss (not eating as much since no longer eating in the dining room).  She continues to have loose stools though is much better than in the past, taking probiotic (no longer uses Lactaid).  10/2017 was given a trial of Questran with meals. She didn't like how she felt (stomach felt full, a little constipated, so no longer using that, just with certain foods).  Mixed hyperlipidemia: Taking Crestor (changed from pravastatin which wasn't getting her to goal for TG; previously didn't tolerate lipitor) She continues on fish oil(3/d),and tries to follow lowfat, low cholesterol diet. She is also taking Coenzyme Q10  daily.  Hasaching in her legs which feels better when she wears compression stockings.She has some stinging in the bottom of her feetat the end of the day--when she wears the compression socks, feel rough, and moisturizing at night helps.She has been using a topical salve called Magnilife DB every other night which helps a lot with this discomfort. She reports this all still remains true. Reports that her legs don't ache nearly as bad as in the past.  Atrial fibrillation: She remains on blood thinners without any bleeding or complications. She sees Dr. JoMartiniquevery 6 months, last in 03/2018.She had echo and perfusion study in July, 2019 (done for some complaint of DOE and scapular pain), no changes noted. No med changes were made.She denies tachycardia, no known episodes of atrial fibrillation (rare, short-lived notices some fast beats).  Blood pressure and pulse have been normal. She denies chest pain, shortness of breath. Sometimes slightly winded after walking up 3 flights of stairs--unchanged. Since walking more now, it seems to have gotten a little better. No longer getting the discomfort in her shoulder, very infrequent.  HTN: Occasionally checks BP, runs 108-112/67-72. Pulse iis usually around 70.No headaches, dizziness, chest pain. She needs to get up slowly in the morning, to avoid dizziness.  No longer gets vertigo (used to get at night, in bed with CPAP on).  OSA: She is using CPAP, and doing well, under the care of Dr. SoHalford ChessmanShe feels more refreshed since being on CPAP. Sometimes it doesn't fit right, slips off--she plans to mention  it to the DME provider.  Allergies--She uses claritin and flonase with fairly good results.  She uses saline regularly.  She uses a salve to use in her nose before using CPAP, which has helped.   At her last visit with me, she felt like her hoarseness had gotten worse (though reports it was somewhat better with Flonase, but overall getting  worse). She denied having symptoms of reflux.  She saw Dr. Benjamine Mola, who did evaluation which showed edema, c/w laryngopharyngeal reflux.  He increased her omeprazole to BID, and did f/u evaluation 2 months later.  She didn't notice any improvement in symptoms, he felt like there was some improvement in the edema.  He referred her to [facility] Voice lab for rehab, which she has been getting regularly.  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  She was told to stop the omeprazole entirely (by the folks at [facility]), so she did, and she denies any recurrent reflux symptoms.  Osteopenia--She restarted Boniva after her bone density 04/2014. It showed osteopenia, but elevated FRAX score (probability of a major osteoporotic fracture is 21%, hip fracture is 5.1%). .Denies any reflux, chest pain or other side effects.LastDEXAwas 05/2016, T-1.6 in L fem neck, not significantly changed from2/2016.  Due for repeat DEXA. At one point she stopped taking Boniva for about 4 months (she didn't feel like it was helping, since the follow-up DEXA looked the same). Not stopped due to side effects or problems.Sherestarted monthly Boniva in 10/2016. She now reports that in the last 6 months she did notice some trouble swallowing the pill (getting a little stuck, drank a lot of water and was able to get it to pass), so hasn't taken it for about 6 months. Currently denies any difficulty swallowing any of her medication or food.  She has been drinking a lot more water (per voice therapist).  Immunization History  Administered Date(s) Administered  . Influenza Split 01/08/2011, 01/08/2012  . Influenza, High Dose  Seasonal PF 12/08/2012, 12/19/2013, 12/21/2014, 12/13/2015, 10/23/2016, 12/14/2017  . Pneumococcal Conjugate-13 03/27/2014  . Pneumococcal Polysaccharide-23 01/31/2002, 01/08/2012  . Td 10/01/2004  . Tdap 01/08/2012  . Zoster 06/01/2008  . Zoster Recombinat (Shingrix) 07/07/2017, 10/18/2017   Last Pap smear: n/a  Last mammogram: 03/2018 Last colonoscopy:12/2015, negative biopsy. No f/u needed Last DEXA:  05/2016 T-1.6 L fem neck, no significant change from 2016 Ophtho: yearly Dentist: every 9 months  Exercise: walking 40-45 minutes daily. Not currently getting weight-bearing exercise (doing an exercise class over the TV, using a band, but not the weights). Prior to COVID-19 pandemic: 30 minute exercise class 2x/week (posture class, uses bands); Goes to weight room (bicycle and elliptical, and some weights) x 45 minutes 3 x/week  Other doctors caring for patient include: Cardiology: Dr. Martinique  Dermatologist: Dr. Allyson Sabal (retired), hasn't been recently Ophtho: Dr. Katy Fitch  Dentist: Dr. Lavone Neri  Pulmonary (for OSA): Dr. Halford Chessman GI: Dr. Watt Climes ENT: Dr. Benjamine Mola Voice (speech pathologist) Madie Reno at [facility]   Depression screen: negative Fall Screen: negative Functional Status Screen: notable only for urinary incontinence(better when she is doing kegel's); sometimes has trouble remembering names where she lives Mini-Cog screen: 4 (missed 1 word on recall)  See full screens in epic  End of Life Discussion: Patient hasa living will and medical power of attorney  Past Medical History:  Diagnosis Date  . Diverticulosis   . Dyspnea on exertion 02/18/2012  . Eye exam abnormal 05/08/15   Dr.Groat-glaucoma suspect  . Glaucoma suspect   . Hemorrhoids    internal and external  . Hemorrhoids 09/25/10   internal and external  . Hyperplastic colon polyp 09/25/10   Dr. Watt Climes  . Osteopenia   . Palpitations 02/18/2012  . Paroxysmal atrial fibrillation (HCC)   . Pure hypercholesterolemia    . Sleep apnea   . Unspecified hypothyroidism    s/p ablation for hyperthyroidism  . Urge urinary incontinence     Past Surgical History:  Procedure Laterality Date  . ABDOMINAL HYSTERECTOMY  age 79   with BSO and bladder tack  . COLONOSCOPY  11/06, 09/25/10, 12/2015   hyperplastic polyp 2012;   . ESOPHAGOGASTRODUODENOSCOPY  09/25/10   tortuous esophagus, intact Nissen fundoplication, dilated the spasm at esophageal GE junction  . EYE SURGERY     bilateral cataract surgery  . LAPAROSCOPIC NISSEN FUNDOPLICATION  9/60   Dr. Hassell Done  . LARYNGOSCOPY  11/24/2017   dx.GERD,start omeprazole    Social History   Socioeconomic History  . Marital status: Married    Spouse name: Not on file  . Number of children: 2  . Years of education: Not on file  . Highest education level: Not on file  Occupational History  . Occupation: retired  Scientific laboratory technician  . Financial resource strain: Not on file  . Food insecurity    Worry: Not on file    Inability: Not on file  . Transportation needs    Medical: Not on file    Non-medical: Not on file  Tobacco Use  . Smoking status: Never Smoker  . Smokeless tobacco: Never Used  Substance and Sexual Activity  . Alcohol use: Yes    Alcohol/week: 0.0 standard drinks    Comment: once a month or less  . Drug use: No  . Sexual activity: Not Currently  Lifestyle  . Physical activity    Days per week: Not on file    Minutes per session: Not on file  . Stress: Not on file  Relationships  . Social Herbalist on phone: Not on file    Gets together: Not on file    Attends religious service: Not on file    Active member of club or organization: Not on file    Attends meetings of clubs or organizations: Not on file    Relationship status: Not on file  . Intimate partner violence    Fear of current or ex partner: Not on file    Emotionally abused: Not on file    Physically abused: Not on file    Forced sexual activity: Not on file  Other  Topics Concern  . Not on file  Social History Narrative   Lives with husband. 2 sons--one in [place], one in [place].  3 grandchildren. Moved to [place] in 10/2012    Family History  Problem Relation Age of Onset  . Heart disease Mother   . Heart disease Father   . Diabetes Father   . Marfan syndrome Sister   . Heart disease Sister        valve replacement  . Heart disease Brother   . Diabetes Brother   . Heart disease Brother   . Diabetes Brother   .  Heart disease Brother   . Heart disease Brother   . Heart disease Brother        CABG  . Heart disease Brother        CABG  . Heart disease Sister        pacemaker  . Aortic aneurysm Sister   . Peripheral Artery Disease Sister        amputation of toes  . Breast cancer Sister 65  . Heart disease Sister        CABG at 30  . Colon cancer Sister 46  . Heart disease Sister        CABG  . Heart disease Sister   . Heart disease Brother   . Heart disease Sister   . Hyperlipidemia Sister   . Stroke Sister     Outpatient Encounter Medications as of 09/01/2018  Medication Sig Note  . Calcium Carbonate-Vitamin D (CALCIUM 600 + D PO) Take 2 tablets by mouth daily.     . cholestyramine (QUESTRAN) 4 g packet Take 1 packet (4 g total) by mouth 3 (three) times daily with meals. 09/01/2018: Only uses a few times a month  . co-enzyme Q-10 30 MG capsule Take 30 mg by mouth daily.     Marland Kitchen ELIQUIS 2.5 MG TABS tablet Take 1 tablet by mouth twice daily   . fluticasone (FLONASE) 50 MCG/ACT nasal spray USE 2 SPRAY(S) IN EACH NOSTRIL ONCE DAILY   . loratadine (CLARITIN) 10 MG tablet Take 10 mg by mouth daily.   . metoprolol tartrate (LOPRESSOR) 25 MG tablet Take 1 tablet (25 mg total) by mouth 2 (two) times daily.   . Misc Natural Products (OSTEO BI-FLEX JOINT SHIELD PO) Take 2 tablets by mouth daily.   . Multiple Vitamin (MULTI-VITAMIN) tablet Take 1 tablet by mouth daily.     . Multiple Vitamins-Minerals (ICAPS PO) Take 1 tablet by  mouth daily.   . Omega-3 Fatty Acids (FISH OIL OMEGA-3 PO) Take 3,600 mg by mouth daily.  08/10/2017: 3642m  . PREVIDENT 5000 PLUS 1.1 % CREA dental cream See admin instructions.   . Probiotic Product (PROBIOTIC PO) Take 1 capsule by mouth daily.   . rosuvastatin (CRESTOR) 20 MG tablet Take 1 tablet by mouth once daily   . SYNTHROID 75 MCG tablet TAKE 1 TABLET BY MOUTH ONCE DAILY BEFORE BREAKFAST   . fluocinonide gel (LIDEX) 0.05 % APPLY TO AFFECTED AREA 4 TIMES DAILY AS NEEDED   . ibandronate (BONIVA) 150 MG tablet TAKE 1 TABLET BY MOUTH ONCE EVERY MONTH, TAKE WITH A FULL GLASS OF WATER AND SO NOT EAT OR LAY DOWN FOR 30 MINUTES (Patient not taking: Reported on 09/01/2018) 09/01/2018: Stopped taking about 6 months ago (when she had a little difficulty swallowing the pill)  . lactase (LACTAID) 3000 UNITS tablet Take 3,000 Units by mouth 3 (three) times daily with meals. As needed   . omeprazole (PRILOSEC) 10 MG capsule Take 10 mg by mouth 2 (two) times daily. 09/01/2018: Stopped taking upon recommendation of voice therapist   No facility-administered encounter medications on file as of 09/01/2018.     No Known Allergies  ROS: The patient denies anorexia, fever, headaches, vision changes, decreased hearing, ear pain, sore throat, breast concerns, chest pain, syncope; denies cough, swelling, nausea, vomiting, constipation, abdominal pain, melena, hematochezia, indigestion/heartburn, dysphagia, vaginal bleeding, discharge, odor or itch, genital lesions, numbness (just bottom of feet at metatarsal heads, reports the salve she uses on her feet helps this), tingling, weakness,  tremor, suspicious skin lesions, depression, anxiety, abnormal bleeding/bruising, or enlarged lymph nodes.  Some trouble remembering names, no other memory concerns. No progression/change in the last year. Some DOE with stairs, which has improved since she has been walking more and doing more stairs. Has some intermittent discomfort at  her right shoulder, hands, thinks arthritis. +urinary complaints as per HPI Some right knee pain after a lot of walking. Hoarseness per HPI (had improved some, but a little worse again now)   PHYSICAL EXAM:  BP 128/74   Pulse 84   Temp 98.4 F (36.9 C) (Temporal)   Ht 4' 9.5" (1.461 m)   Wt 128 lb 12.8 oz (58.4 kg)   BMI 27.39 kg/m   Wt Readings from Last 3 Encounters:  03/15/18 129 lb (58.5 kg)  02/17/18 133 lb (60.3 kg)  11/18/17 131 lb 12.8 oz (59.8 kg)   General Appearance:  Alert, cooperative, no distress, appears stated age. Voice is hoarse, unchanged from prior visits  Head:  Normocephalic, without obvious abnormality, atraumatic   Eyes:  PERRL, conjunctiva/corneas clear, EOM's intact, fundi benign   Ears:  Normal TM's and external ear canals   Nose:  Not examined (wearing mask for COVID-19 pandemic)  Throat:  Not examined (wearing mask for COVID-19 pandemic)  Neck:  Supple, no lymphadenopathy; thyroid: no enlargement/ tenderness/nodules; no carotid bruit or JVD   Back:  Spine nontender; ROM normal. +kyphosis and scoliosis. Very slight CVA tenderness bilaterally, and extending slightly inferiorly to lower back  Lungs:  Clear to auscultation bilaterally without wheezes, rales or ronchi; respirations unlabored   Chest Wall:  No tenderness or deformity   Heart:  Regular rate and rhythm, S1 and S2 normal, no murmur, rub or gallop.  Breast Exam:  No tenderness, masses, or nipple discharge or inversion. No axillary lymphadenopathy   Abdomen:  Soft, nondistended, normoactive bowel sounds, no hepatosplenomegaly.Mild suprapubic tenderness. No rebound, guarding or masses.  Genitalia:  Normal external genitalia without lesions. BUS and vagina normal, atrophic changes noted; No abnormal vaginal discharge. Uterus and adnexa surgically absent, no masses. Pap not performed   Rectal:  Normal tone, no masses or tenderness; guaiac  negative stool   Extremities:  No clubbing, cyanosis or edema. 2+ pulses.  Pulses:  2+ and symmetric all extremities   Skin:  Skin color, texture, turgor normal, no lesions or rash  Lymph nodes:  Cervical, supraclavicular, and axillary nodes normal   Neurologic:  CNII-XII intact, normal strength, sensation and gait; reflexes 2+ and symmetric throughout. Alert and oriented.   Psych: Normal mood, affect, hygiene and grooming    Chemistry      Component Value Date/Time   NA 142 08/30/2018 0845   K 4.5 08/30/2018 0845   CL 105 08/30/2018 0845   CO2 20 08/30/2018 0845   BUN 15 08/30/2018 0845   CREATININE 0.86 08/30/2018 0845   CREATININE 0.83 04/21/2016 0852      Component Value Date/Time   CALCIUM 9.6 08/30/2018 0845   ALKPHOS 73 08/30/2018 0845   AST 31 08/30/2018 0845   ALT 62 (H) 08/30/2018 0845   BILITOT 0.3 08/30/2018 0845     Fasting glucose 96  Lab Results  Component Value Date   TSH 0.708 08/30/2018   Lab Results  Component Value Date   CHOL 110 08/30/2018   HDL 32 (L) 08/30/2018   LDLCALC 45 08/30/2018   TRIG 167 (H) 08/30/2018   CHOLHDL 3.4 08/30/2018   Lab Results  Component Value Date  WBC 4.6 08/30/2018   HGB 12.1 08/30/2018   HCT 34.5 08/30/2018   MCV 96 08/30/2018   PLT 303 08/30/2018   Urine dip from today:  3+ leuks   ASSESSMENT/PLAN:  Medicare annual wellness visit, subsequent   Mixed hyperlipidemia - drop in HDL, increase in TG. Cont Crestor, fish oil, counseled re: diet, exercise - Plan: Lipid panel  Postablative hypothyroidism - adequately replaced, continue Synthroid 75 mcg - Plan: TSH, SYNTHROID 75 MCG tablet  Paroxysmal atrial fibrillation (HCC) - rate controlled, on anticoagulant without complication. Sees cardiologist q6 mo   Obstructive sleep apnea - continue CPAP   Osteopenia of neck of left femur - due for DEXA; hasn't taken bisphosphonate in 6 months. Discussed Ca, D and wt bearing  exercise - Plan: DG Bone Density  Chronic hoarseness - under care of Dr. Benjamine Mola, getting voice therapy at [facility]   Allergic rhinitis, unspecified seasonality, unspecified trigger - Plan: fluticasone (FLONASE) 50 MCG/ACT nasal spray  LPRD (laryngopharyngeal reflux disease)   Low back pain, unspecified back pain laterality, unspecified chronicity, unspecified whether sciatica present - related to acute cystitis; no fever or more severe symptoms to suggest pyelo at this time - Plan: POCT Urinalysis DIP (Proadvantage Device)  Urinary frequency - Plan: POCT Urinalysis DIP (Proadvantage Device)  Acute cystitis without hematuria - Plan: Urine Culture, nitrofurantoin, macrocrystal-monohydrate, (MACROBID) 100 MG capsule  Medication monitoring encounter - Plan: Lipid panel, CBC with Differential/Platelet, TSH, Comprehensive metabolic panel  Allergic rhinitis, unspecified seasonality, unspecified trigger - continue flonase and claritin - Plan: fluticasone (FLONASE) 50 MCG/ACT nasal spray  Voice hoarseness - chronic; [facility] voice [facility]     Discussed monthly self breast exams and yearly mammograms; at least 30 minutes of aerobic activity at least 5 days/week, weight-bearing exercise at least 2x/week; proper sunscreen use reviewed; healthy diet, including goals of calcium and vitamin D intake and alcohol recommendations (less than or equal to 1 drink/day) reviewed; regular seatbelt use; changing batteries in smoke detectors. Immunization recommendations discussed--UTD.Continue yearly high dose flu shots.Colonoscopy recommendations reviewed, UTD. DEXA due  F/u 6 mos for med check with labs prior--Lipids, c-met, CBC, TSH  MOST form reviewed and updated, full care.   Medicare Attestation I have personally reviewed: The patient's medical and social history Their use of alcohol, tobacco or illicit drugs Their current medications and supplements The patient's functional ability including  ADLs,fall risks, home safety risks, cognitive, and hearing and visual impairment Diet and physical activities Evidence for depression or mood disorders  The patient's weight, height, BMI, and visual acuity have been recorded in the chart.  I have made referrals, counseling, and provided education to the patient based on review of the above and I have provided the patient with a written personalized care plan for preventive services.

## 2018-08-31 NOTE — Patient Instructions (Addendum)
  HEALTH MAINTENANCE RECOMMENDATIONS:  It is recommended that you get at least 30 minutes of aerobic exercise at least 5 days/week (for weight loss, you may need as much as 60-90 minutes). This can be any activity that gets your heart rate up. This can be divided in 10-15 minute intervals if needed, but try and build up your endurance at least once a week.  Weight bearing exercise is also recommended twice weekly.  Eat a healthy diet with lots of vegetables, fruits and fiber.  "Colorful" foods have a lot of vitamins (ie green vegetables, tomatoes, red peppers, etc).  Limit sweet tea, regular sodas and alcoholic beverages, all of which has a lot of calories and sugar.  Up to 1 alcoholic drink daily may be beneficial for women (unless trying to lose weight, watch sugars).  Drink a lot of water.  Calcium recommendations are 1200-1500 mg daily (1500 mg for postmenopausal women or women without ovaries), and vitamin D 1000 IU daily.  This should be obtained from diet and/or supplements (vitamins), and calcium should not be taken all at once, but in divided doses.  Monthly self breast exams and yearly mammograms for women over the age of 91 is recommended.  Sunscreen of at least SPF 30 should be used on all sun-exposed parts of the skin when outside between the hours of 10 am and 4 pm (not just when at beach or pool, but even with exercise, golf, tennis, and yard work!)  Use a sunscreen that says "broad spectrum" so it covers both UVA and UVB rays, and make sure to reapply every 1-2 hours.  Remember to change the batteries in your smoke detectors when changing your clock times in the spring and fall. Carbon monoxide detectors are recommended for your home.  Use your seat belt every time you are in a car, and please drive safely and not be distracted with cell phones and texting while driving.   Ms. Kavan , Thank you for taking time to come for your Medicare Wellness Visit. I appreciate your ongoing  commitment to your health goals. Please review the following plan we discussed and let me know if I can assist you in the future.   This is a list of the screening recommended for you and due dates:  Health Maintenance  Topic Date Due  . Flu Shot  10/02/2018  . Tetanus Vaccine  01/07/2022  . DEXA scan (bone density measurement)  Completed  . Pneumonia vaccines  Completed   You are due for repeat bone density test. I entered the order--[facility] to [facility].  Continue yearly high dose flu shots (in the Fall)

## 2018-09-01 ENCOUNTER — Other Ambulatory Visit: Payer: Self-pay

## 2018-09-01 ENCOUNTER — Ambulatory Visit (INDEPENDENT_AMBULATORY_CARE_PROVIDER_SITE_OTHER): Payer: Medicare Other | Admitting: Family Medicine

## 2018-09-01 ENCOUNTER — Encounter: Payer: Self-pay | Admitting: Family Medicine

## 2018-09-01 VITALS — BP 128/74 | HR 84 | Temp 98.4°F | Ht <= 58 in | Wt 128.8 lb

## 2018-09-01 DIAGNOSIS — I480 Paroxysmal atrial fibrillation: Secondary | ICD-10-CM

## 2018-09-01 DIAGNOSIS — N3000 Acute cystitis without hematuria: Secondary | ICD-10-CM

## 2018-09-01 DIAGNOSIS — E782 Mixed hyperlipidemia: Secondary | ICD-10-CM

## 2018-09-01 DIAGNOSIS — Z5181 Encounter for therapeutic drug level monitoring: Secondary | ICD-10-CM

## 2018-09-01 DIAGNOSIS — E890 Postprocedural hypothyroidism: Secondary | ICD-10-CM

## 2018-09-01 DIAGNOSIS — R350 Frequency of micturition: Secondary | ICD-10-CM

## 2018-09-01 DIAGNOSIS — G4733 Obstructive sleep apnea (adult) (pediatric): Secondary | ICD-10-CM | POA: Diagnosis not present

## 2018-09-01 DIAGNOSIS — M85852 Other specified disorders of bone density and structure, left thigh: Secondary | ICD-10-CM | POA: Diagnosis not present

## 2018-09-01 DIAGNOSIS — Z0000 Encounter for general adult medical examination without abnormal findings: Secondary | ICD-10-CM

## 2018-09-01 DIAGNOSIS — K219 Gastro-esophageal reflux disease without esophagitis: Secondary | ICD-10-CM | POA: Diagnosis not present

## 2018-09-01 DIAGNOSIS — M545 Low back pain, unspecified: Secondary | ICD-10-CM

## 2018-09-01 DIAGNOSIS — R490 Dysphonia: Secondary | ICD-10-CM | POA: Diagnosis not present

## 2018-09-01 DIAGNOSIS — J309 Allergic rhinitis, unspecified: Secondary | ICD-10-CM

## 2018-09-01 LAB — POCT URINALYSIS DIP (PROADVANTAGE DEVICE)
Bilirubin, UA: NEGATIVE
Blood, UA: NEGATIVE
Glucose, UA: NEGATIVE mg/dL
Ketones, POC UA: NEGATIVE mg/dL
Nitrite, UA: NEGATIVE
Protein Ur, POC: NEGATIVE mg/dL
Specific Gravity, Urine: 1.01
Urobilinogen, Ur: NEGATIVE
pH, UA: 5.5 (ref 5.0–8.0)

## 2018-09-01 MED ORDER — FLUTICASONE PROPIONATE 50 MCG/ACT NA SUSP: NASAL | 3 refills | Status: DC

## 2018-09-01 MED ORDER — NITROFURANTOIN MONOHYD MACRO 100 MG PO CAPS: 100.0000 mg | ORAL_CAPSULE | Freq: Two times a day (BID) | ORAL | 0 refills | Status: DC

## 2018-09-01 MED ORDER — SYNTHROID 75 MCG PO TABS: ORAL_TABLET | ORAL | 1 refills | Status: DC

## 2018-09-02 ENCOUNTER — Telehealth: Payer: Self-pay | Admitting: *Deleted

## 2018-09-02 NOTE — Telephone Encounter (Signed)
Spoke with pt.  Recommended using imodium as needed, continuing her probiotic, and avoiding dairy. She uses MyChart, and she was advised she can send a message over the weekend if has any concerns, and she verified that she could see her urine culture results if I release them to her over the weekend (rather than waiting until Monday to call her).

## 2018-09-02 NOTE — Telephone Encounter (Signed)
Patient called and has taken 2 doses of the macrobid. She has had 4 bouts of diarrhea and wanted to call and let you know to see if you thought it should be changed. Please advise.

## 2018-09-03 LAB — URINE CULTURE

## 2018-09-06 ENCOUNTER — Telehealth: Payer: Self-pay | Admitting: *Deleted

## 2018-09-06 NOTE — Telephone Encounter (Signed)
Patient called and said that you wanted her to call and give you and update. She said she has not had anymore diarrhea. She is feeling better. Still has some abdominal pressure and some lbp but mostly better. She will call back once she completes all abx if not completely better.

## 2018-09-06 NOTE — Telephone Encounter (Signed)
noted 

## 2018-09-10 NOTE — Progress Notes (Signed)
Abigail Day Date of Birth: 08-03-1933 Medical Record #[id number]  History of Present Illness: Mrs. Abigail BergeronKey is seen for follow up of Afib. She has a history of paroxysmal atrial fib - on Eliquis. EF is normal per echo back in December of 2013. Normal Myoview in June 2015. Other issues include HLD, hypothyroidism with prior ablation for hyperthyroidism, and diverticulosis. She is  on CPAP therapy for sleep apnea-followed by pulmonary. In 2019 she had some scapular pain and dyspnea. We repeated an Echo and Myoview study which were unremarkable.  On follow up today she states she is doing well. She notes occasional increase in HR in the morning. No dyspnea or edema.  She is using CPAP.    Current Outpatient Medications on File Prior to Visit  Medication Sig Dispense Refill  . Calcium Carbonate-Vitamin D (CALCIUM 600 + D PO) Take 2 tablets by mouth daily.      . cholestyramine (QUESTRAN) 4 g packet Take 1 packet (4 g total) by mouth 3 (three) times daily with meals. 60 each 2  . co-enzyme Q-10 30 MG capsule Take 30 mg by mouth daily.      Marland Kitchen. ELIQUIS 2.5 MG TABS tablet Take 1 tablet by mouth twice daily 180 tablet 0  . fluocinonide gel (LIDEX) 0.05 % APPLY TO AFFECTED AREA 4 TIMES DAILY AS NEEDED    . fluticasone (FLONASE) 50 MCG/ACT nasal spray USE 2 SPRAY(S) IN EACH NOSTRIL ONCE DAILY 48 g 3  . ibandronate (BONIVA) 150 MG tablet TAKE 1 TABLET BY MOUTH ONCE EVERY MONTH, TAKE WITH A FULL GLASS OF WATER AND SO NOT EAT OR LAY DOWN FOR 30 MINUTES 3 tablet 3  . lactase (LACTAID) 3000 UNITS tablet Take 3,000 Units by mouth 3 (three) times daily with meals. As needed    . loratadine (CLARITIN) 10 MG tablet Take 10 mg by mouth daily.    . metoprolol tartrate (LOPRESSOR) 25 MG tablet Take 1 tablet (25 mg total) by mouth 2 (two) times daily. 180 tablet 3  . Misc Natural Products (OSTEO BI-FLEX JOINT SHIELD PO) Take 2 tablets by mouth daily.    . Multiple Vitamin (MULTI-VITAMIN) tablet Take 1 tablet by mouth  daily.      . Multiple Vitamins-Minerals (ICAPS PO) Take 1 tablet by mouth daily.    . nitrofurantoin, macrocrystal-monohydrate, (MACROBID) 100 MG capsule Take 1 capsule (100 mg total) by mouth 2 (two) times daily. 14 capsule 0  . Omega-3 Fatty Acids (FISH OIL OMEGA-3 PO) Take 3,600 mg by mouth daily.     Marland Kitchen. omeprazole (PRILOSEC) 10 MG capsule Take 10 mg by mouth 2 (two) times daily.    Marland Kitchen. PREVIDENT 5000 PLUS 1.1 % CREA dental cream See admin instructions.  3  . Probiotic Product (PROBIOTIC PO) Take 1 capsule by mouth daily.    . rosuvastatin (CRESTOR) 20 MG tablet Take 1 tablet by mouth once daily 90 tablet 0  . SYNTHROID 75 MCG tablet TAKE 1 TABLET BY MOUTH ONCE DAILY BEFORE BREAKFAST 90 tablet 1   No current facility-administered medications on file prior to visit.     No Known Allergies  Past Medical History:  Diagnosis Date  . Diverticulosis   . Dyspnea on exertion 02/18/2012  . Eye exam abnormal 05/08/15   Dr.Groat-glaucoma suspect  . Glaucoma suspect   . Hemorrhoids    internal and external  . Hemorrhoids 09/25/10   internal and external  . Hyperplastic colon polyp 09/25/10   Dr. Ewing SchleinMagod  .  Osteopenia   . Palpitations 02/18/2012  . Paroxysmal atrial fibrillation (HCC)   . Pure hypercholesterolemia   . Sleep apnea   . Unspecified hypothyroidism    s/p ablation for hyperthyroidism  . Urge urinary incontinence     Past Surgical History:  Procedure Laterality Date  . ABDOMINAL HYSTERECTOMY  age 89   with BSO and bladder tack  . COLONOSCOPY  11/06, 09/25/10, 12/2015   hyperplastic polyp 2012;   . ESOPHAGOGASTRODUODENOSCOPY  09/25/10   tortuous esophagus, intact Nissen fundoplication, dilated the spasm at esophageal GE junction  . EYE SURGERY     bilateral cataract surgery  . LAPAROSCOPIC NISSEN FUNDOPLICATION  8/07   Dr. Daphine DeutscherMartin  . LARYNGOSCOPY  11/24/2017   dx.GERD,start omeprazole    Social History   Tobacco Use  Smoking Status Never Smoker  Smokeless Tobacco Never  Used    Social History   Substance and Sexual Activity  Alcohol Use Yes  . Alcohol/week: 0.0 standard drinks   Comment: once a month or less    Family History  Problem Relation Age of Onset  . Heart disease Mother   . Heart disease Father   . Diabetes Father   . Marfan syndrome Sister   . Heart disease Sister        valve replacement  . Heart disease Brother   . Diabetes Brother   . Heart disease Brother   . Diabetes Brother   . Heart disease Brother   . Heart disease Brother   . Heart disease Brother        CABG  . Heart disease Brother        CABG  . Heart disease Sister        pacemaker  . Aortic aneurysm Sister   . Peripheral Artery Disease Sister        amputation of toes  . Breast cancer Sister 6770  . Heart disease Sister        CABG at 4380  . Colon cancer Sister 5288  . Heart disease Sister        CABG  . Heart disease Sister   . Heart disease Brother        stent  . Heart disease Sister   . Hyperlipidemia Sister   . Stroke Sister        in 1948, mild    Review of Systems: The review of systems is per the HPI.  All other systems were reviewed and are negative.  Physical Exam: BP 102/64 (BP Location: Left Arm, Patient Position: Sitting, Cuff Size: Normal)   Pulse 67   Temp 98.1 F (36.7 C)   Ht 4\' 10"  (1.473 m)   Wt 127 lb (57.6 kg)   BMI 26.54 kg/m  GENERAL:  [name] in NAD HEENT:  PERRL, EOMI, sclera are clear. Oropharynx is clear. NECK:  No jugular venous distention, carotid upstroke brisk and symmetric, no bruits, no thyromegaly or adenopathy LUNGS:  Clear to auscultation bilaterally CHEST:  Unremarkable HEART:  RRR,  PMI not displaced or sustained,S1 and S2 within normal limits, no S3, no S4: no clicks, no rubs, no murmurs ABD:  Soft, nontender. BS +, no masses or bruits. No hepatomegaly, no splenomegaly EXT:  2 + pulses throughout, no edema, no cyanosis no clubbing SKIN:  Warm and dry.  No rashes NEURO:  Alert and  oriented x 3. Cranial nerves II through XII intact. PSYCH:  Cognitively intact    LABORATORY DATA:  Lab Results  Component Value Date   WBC 4.6 08/30/2018   HGB 12.1 08/30/2018   HCT 34.5 08/30/2018   PLT 303 08/30/2018   GLUCOSE 96 08/30/2018   CHOL 110 08/30/2018   TRIG 167 (H) 08/30/2018   HDL 32 (L) 08/30/2018   LDLCALC 45 08/30/2018   ALT 62 (H) 08/30/2018   AST 31 08/30/2018   NA 142 08/30/2018   K 4.5 08/30/2018   CL 105 08/30/2018   CREATININE 0.86 08/30/2018   BUN 15 08/30/2018   CO2 20 08/30/2018   TSH 0.708 08/30/2018   HGBA1C 5.4 09/24/2015   Ecg today shows NSR rate 67. Normal Ecg. I have personally reviewed and interpreted this study.    Echo 09/23/17: Study Conclusions  - Left ventricle: The cavity size was normal. Wall thickness was   normal. Systolic function was normal. The estimated ejection   fraction was in the range of 60% to 65%. Doppler parameters are   consistent with abnormal left ventricular relaxation (grade 1   diastolic dysfunction). The Ee&' ratio is between 8-15, suggesting   indeterminate LV filling pressure. - Left atrium: The atrium was normal in size. - Tricuspid valve: There was mild regurgitation. - Pulmonary arteries: PA peak pressure: 27 mm Hg (S). - Inferior vena cava: The vessel was normal in size. The   respirophasic diameter changes were in the normal range (>= 50%),   consistent with normal central venous pressure.  Impressions:  - Compared to a prior study in 2013, the LVEF is unchanged. Left   and right atria measure normal in size.  Myoview 09/29/17: Study Highlights     Nuclear stress EF: 55%.  No T wave inversion was noted during stress.  There was no ST segment deviation noted during stress.  The study is normal.  This is a low risk study.   Normal perfusion. LVEF 55% with normal wall motion. This is a low risk study. No change compared to prior study.     Assessment / Plan:  1.  Obstructive sleep apnea. On CPAP therapy  2. PAF - treated with rate control with metoprolol and Eliquis.  Will continue with current rate control strategy.   3. Hypercholesterolemia. Most recent lipid panel showed excellent LDL of 45 on Crestor.   Follow up in 6 months.

## 2018-09-13 ENCOUNTER — Encounter: Payer: Self-pay | Admitting: Cardiology

## 2018-09-13 ENCOUNTER — Ambulatory Visit (INDEPENDENT_AMBULATORY_CARE_PROVIDER_SITE_OTHER): Payer: Medicare Other | Admitting: Cardiology

## 2018-09-13 ENCOUNTER — Other Ambulatory Visit: Payer: Self-pay

## 2018-09-13 VITALS — BP 102/64 | HR 67 | Temp 98.1°F | Ht <= 58 in | Wt 127.0 lb

## 2018-09-13 DIAGNOSIS — I480 Paroxysmal atrial fibrillation: Secondary | ICD-10-CM

## 2018-09-13 DIAGNOSIS — E782 Mixed hyperlipidemia: Secondary | ICD-10-CM | POA: Diagnosis not present

## 2018-09-13 NOTE — Patient Instructions (Signed)
Continue your current therapy  Follow up in 6 months   

## 2018-09-28 DIAGNOSIS — J383 Other diseases of vocal cords: Secondary | ICD-10-CM | POA: Diagnosis not present

## 2018-09-28 DIAGNOSIS — R490 Dysphonia: Secondary | ICD-10-CM | POA: Diagnosis not present

## 2018-09-28 DIAGNOSIS — J3801 Paralysis of vocal cords and larynx, unilateral: Secondary | ICD-10-CM | POA: Diagnosis not present

## 2018-10-11 ENCOUNTER — Other Ambulatory Visit: Payer: Self-pay | Admitting: Family Medicine

## 2018-10-11 DIAGNOSIS — E782 Mixed hyperlipidemia: Secondary | ICD-10-CM

## 2018-10-15 ENCOUNTER — Other Ambulatory Visit: Payer: Self-pay | Admitting: Cardiology

## 2018-11-22 ENCOUNTER — Ambulatory Visit
Admission: RE | Admit: 2018-11-22 | Discharge: 2018-11-22 | Disposition: A | Discharge status: Home / Self Care | Payer: Medicare Other | Source: Ambulatory Visit | Attending: Family Medicine | Admitting: Family Medicine

## 2018-11-22 ENCOUNTER — Other Ambulatory Visit: Payer: Self-pay

## 2018-11-22 DIAGNOSIS — M85852 Other specified disorders of bone density and structure, left thigh: Secondary | ICD-10-CM

## 2018-11-22 DIAGNOSIS — Z780 Asymptomatic menopausal state: Secondary | ICD-10-CM | POA: Diagnosis not present

## 2018-11-22 DIAGNOSIS — M8589 Other specified disorders of bone density and structure, multiple sites: Secondary | ICD-10-CM | POA: Diagnosis not present

## 2018-11-24 ENCOUNTER — Other Ambulatory Visit: Payer: Self-pay | Admitting: Family Medicine

## 2018-11-24 DIAGNOSIS — M8580 Other specified disorders of bone density and structure, unspecified site: Secondary | ICD-10-CM

## 2018-12-29 ENCOUNTER — Other Ambulatory Visit (INDEPENDENT_AMBULATORY_CARE_PROVIDER_SITE_OTHER): Payer: Medicare Other

## 2018-12-29 ENCOUNTER — Other Ambulatory Visit: Payer: Self-pay

## 2018-12-29 DIAGNOSIS — Z23 Encounter for immunization: Secondary | ICD-10-CM

## 2019-01-07 ENCOUNTER — Other Ambulatory Visit: Payer: Self-pay | Admitting: Family Medicine

## 2019-01-07 ENCOUNTER — Other Ambulatory Visit: Payer: Self-pay | Admitting: Cardiology

## 2019-01-07 DIAGNOSIS — E782 Mixed hyperlipidemia: Secondary | ICD-10-CM

## 2019-01-07 NOTE — Telephone Encounter (Signed)
85 F 57.6 kg, SCr 0.86 (6/20), LOV [place] 7/20

## 2019-02-01 ENCOUNTER — Encounter: Payer: Self-pay | Admitting: Family Medicine

## 2019-02-07 ENCOUNTER — Other Ambulatory Visit: Payer: Self-pay | Admitting: Family Medicine

## 2019-02-07 DIAGNOSIS — Z1231 Encounter for screening mammogram for malignant neoplasm of breast: Secondary | ICD-10-CM

## 2019-02-16 ENCOUNTER — Encounter: Payer: Self-pay | Admitting: Pulmonary Disease

## 2019-02-16 ENCOUNTER — Other Ambulatory Visit: Payer: Self-pay

## 2019-02-16 ENCOUNTER — Ambulatory Visit (INDEPENDENT_AMBULATORY_CARE_PROVIDER_SITE_OTHER): Payer: Medicare Other | Admitting: Pulmonary Disease

## 2019-02-16 VITALS — Ht <= 58 in | Wt 127.0 lb

## 2019-02-16 DIAGNOSIS — G4733 Obstructive sleep apnea (adult) (pediatric): Secondary | ICD-10-CM | POA: Diagnosis not present

## 2019-02-16 DIAGNOSIS — J3489 Other specified disorders of nose and nasal sinuses: Secondary | ICD-10-CM | POA: Diagnosis not present

## 2019-02-16 NOTE — Progress Notes (Signed)
[facility]  Chief Complaint  Patient presents with  . Follow-up    Patient feels good overall. Patient is having trouble keeping it on at night and her nose is getting irritated and dry.    Constitutional:  Ht 4\' 10"  (1.473 m)   Wt 127 lb (57.6 kg)   BMI 26.54 kg/m   Past Medical History:  Hypothyroidism, HLD, PAF, Osteopenia, Colon polyp, Diverticulosis  Brief Summary:  Abigail Day is a 85 y.o. female with obstructive sleep apnea.  Virtual Visit via Telephone Note  I connected with [name] on 02/16/19 at 10:00 AM EST by telephone and verified that I am speaking with the correct person using two identifiers.  Location: Patient: home Provider: medical office   I discussed the limitations, risks, security and privacy concerns of performing an evaluation and management service by telephone and the availability of in person appointments. I also discussed with the patient that there may be a patient responsible charge related to this service. The patient expressed understanding and agreed to proceed.  She has been getting dryness in her nose.  She will get episodes of air leak, and then the pressure on her auto CPAP feels too high.  She has been using saline gel.  Hasn't changed humidifier settings.  Was using nasal pillows, but this caused nasal irritation.  Now using nasal cushion and likes this better.    Physical Exam:   Deferred  Assessment/Plan:   Obstructive sleep apnea. - she reports compliance with CPAP - having trouble with nasal dryness and air leak - advised her to increase humidifier setting - continue nasal saline gel - will change her auto CPAP to 5 - 12 cm H2O, and get download 2 weeks after changing pressure  Hoarseness likely from reflux. - seen by Dr. 02/18/19 ENT and speech therapy at [facility]  Paroxysmal atrial fibrillation. - followed by Dr. [facility] with cardiology   Patient Instructions  Will change your auto  CPAP setting to 5 - 12 cm water  Try increasing temperature setting on CPAP humidifier  Call if your nose dryness persists  Follow up in 1 year   I discussed the assessment and treatment plan with the patient. The patient was provided an opportunity to ask questions and all were answered. The patient agreed with the plan and demonstrated an understanding of the instructions.   The patient was advised to call back or seek an in-person evaluation if the symptoms worsen or if the condition fails to improve as anticipated.  I provided 14 minutes of non-face-to-face time during this encounter.  [name], MD [facility] Pager: [phone] 02/16/2019, 10:18 AM  Flow Sheet    Sleep tests:  PSG 08/18/12 >> AHI 23, SpO2 low 81% Auto CPAP 10/19/17 to 11/17/17 >> used on 30  Of 30 nights with average 7 hrs 23 min.  Average AHI 1.9 with median CPAP 9 and 95 th percentile CPAP 12 cm H2O.  Air leak.  Cardiac tests:  Echo 09/23/17 >> EF 60 to 65%, grade 1 DD, mild TR  Medications:   Allergies as of 02/16/2019   No Known Allergies     Medication List       Accurate as of February 16, 2019 10:18 AM. If you have any questions, ask your nurse or doctor.        STOP taking these medications   nitrofurantoin (macrocrystal-monohydrate) 100 MG capsule Commonly known as: Macrobid Stopped by: February 18, 2019  Halford Chessman, MD   omeprazole 10 MG capsule Commonly known as: PRILOSEC Stopped by: Chesley Mires, MD     TAKE these medications   CALCIUM 600 + D PO Take 2 tablets by mouth daily.   cholestyramine 4 g packet Commonly known as: Questran Take 1 packet (4 g total) by mouth 3 (three) times daily with meals.   co-enzyme Q-10 30 MG capsule Take 30 mg by mouth daily.   Eliquis 2.5 MG Tabs tablet Generic drug: apixaban Take 1 tablet by mouth twice daily   FISH OIL OMEGA-3 PO Take 3,600 mg by mouth daily.   fluocinonide gel 0.05 % Commonly known as: LIDEX APPLY TO AFFECTED  AREA 4 TIMES DAILY AS NEEDED   fluticasone 50 MCG/ACT nasal spray Commonly known as: FLONASE USE 2 SPRAY(S) IN EACH NOSTRIL ONCE DAILY   ibandronate 150 MG tablet Commonly known as: BONIVA TAKE ONE TABLET BY MOUTH ONCE EVERY MONTH.  TAKE WITH A FULL GLASS OF WATER AND DO NOT EAT OR LAY DOWN FOR 30 MINUTES.   ICAPS PO Take 1 tablet by mouth daily.   lactase 3000 units tablet Commonly known as: LACTAID Take 3,000 Units by mouth 3 (three) times daily with meals. As needed   loratadine 10 MG tablet Commonly known as: CLARITIN Take 10 mg by mouth daily.   metoprolol tartrate 25 MG tablet Commonly known as: LOPRESSOR Take 1 tablet (25 mg total) by mouth 2 (two) times daily.   Multi-Vitamin tablet Take 1 tablet by mouth daily.   OSTEO BI-FLEX JOINT SHIELD PO Take 2 tablets by mouth daily.   PreviDent 5000 Plus 1.1 % Crea dental cream Generic drug: sodium fluoride See admin instructions.   PROBIOTIC PO Take 1 capsule by mouth daily.   rosuvastatin 20 MG tablet Commonly known as: CRESTOR Take 1 tablet by mouth once daily   Synthroid 75 MCG tablet Generic drug: levothyroxine TAKE 1 TABLET BY MOUTH ONCE DAILY BEFORE BREAKFAST       Past Surgical History:  She  has a past surgical history that includes Abdominal hysterectomy (age 27); Eye surgery; Colonoscopy (11/06, 09/25/10, 12/2015); Laparoscopic Nissen fundoplication (7/94); Esophagogastroduodenoscopy (09/25/10); and Laryngoscopy (11/24/2017).  Family History:  Her family history includes Aortic aneurysm in her sister; Breast cancer (age of onset: 23) in her sister; Colon cancer (age of onset: 40) in her sister; Diabetes in her brother, brother, and father; Heart disease in her brother, brother, brother, brother, brother, brother, brother, father, mother, sister, sister, sister, sister, sister, and sister; Hyperlipidemia in her sister; Marfan syndrome in her sister; Peripheral Artery Disease in her sister; Stroke in her  sister.  Social History:  She  reports that she has never smoked. She has never used smokeless tobacco. She reports current alcohol use. She reports that she does not use drugs.

## 2019-02-16 NOTE — Patient Instructions (Signed)
Will change your auto CPAP setting to 5 - 12 cm water  Try increasing temperature setting on CPAP humidifier  Call if your nose dryness persists  Follow up in 1 year

## 2019-02-23 ENCOUNTER — Telehealth: Payer: Self-pay | Admitting: Pulmonary Disease

## 2019-02-23 DIAGNOSIS — G4733 Obstructive sleep apnea (adult) (pediatric): Secondary | ICD-10-CM

## 2019-02-23 NOTE — Telephone Encounter (Signed)
I agree.  It looks like patient's DME for her CPAP Supplies.

## 2019-02-23 NOTE — Telephone Encounter (Signed)
Information in referrals tab shows orders have been sent to [facility] since 2016. Only one order has been placed to [facility] which was on 11/18/2017. However, all orders after that were to [facility].  It looks like the order sent [facility] back in 2019 was sent in error. Am I looking at that correctly? Please advise, thank you.

## 2019-02-23 NOTE — Telephone Encounter (Signed)
Left message for patient to call back  

## 2019-02-28 ENCOUNTER — Other Ambulatory Visit: Payer: Self-pay

## 2019-02-28 ENCOUNTER — Other Ambulatory Visit: Payer: Medicare Other

## 2019-02-28 DIAGNOSIS — E782 Mixed hyperlipidemia: Secondary | ICD-10-CM

## 2019-02-28 DIAGNOSIS — E890 Postprocedural hypothyroidism: Secondary | ICD-10-CM

## 2019-02-28 DIAGNOSIS — Z5181 Encounter for therapeutic drug level monitoring: Secondary | ICD-10-CM

## 2019-02-28 NOTE — Telephone Encounter (Signed)
lmtcb for pt.  

## 2019-02-28 NOTE — Telephone Encounter (Signed)
Pt returning call.  Cell:  [phone] if not available at [phone].

## 2019-02-28 NOTE — Telephone Encounter (Signed)
Left message for patient to call back  

## 2019-03-01 LAB — LIPID PANEL
Chol/HDL Ratio: 2.7 ratio (ref 0.0–4.4)
Cholesterol, Total: 127 mg/dL (ref 100–199)
HDL: 47 mg/dL (ref >39)
LDL Chol Calc (NIH): 59 mg/dL (ref 0–99)
Triglycerides: 120 mg/dL (ref 0–149)
VLDL Cholesterol Cal: 21 mg/dL (ref 5–40)

## 2019-03-01 LAB — CBC WITH DIFFERENTIAL/PLATELET
Basophils Absolute: 0 10*3/uL (ref 0.0–0.2)
Basos: 1 %
EOS (ABSOLUTE): 0.1 10*3/uL (ref 0.0–0.4)
Eos: 2 %
Hematocrit: 37.1 % (ref 34.0–46.6)
Hemoglobin: 12.6 g/dL (ref 11.1–15.9)
Immature Grans (Abs): 0 10*3/uL (ref 0.0–0.1)
Immature Granulocytes: 0 %
Lymphocytes Absolute: 1.2 10*3/uL (ref 0.7–3.1)
Lymphs: 26 %
MCH: 33.3 pg — ABNORMAL HIGH (ref 26.6–33.0)
MCHC: 34 g/dL (ref 31.5–35.7)
MCV: 98 fL — ABNORMAL HIGH (ref 79–97)
Monocytes Absolute: 0.5 10*3/uL (ref 0.1–0.9)
Monocytes: 10 %
Neutrophils Absolute: 2.7 10*3/uL (ref 1.4–7.0)
Neutrophils: 61 %
Platelets: 208 10*3/uL (ref 150–450)
RBC: 3.78 x10E6/uL (ref 3.77–5.28)
RDW: 12.2 % (ref 11.7–15.4)
WBC: 4.5 10*3/uL (ref 3.4–10.8)

## 2019-03-01 LAB — COMPREHENSIVE METABOLIC PANEL
ALT: 20 IU/L (ref 0–32)
AST: 22 IU/L (ref 0–40)
Albumin/Globulin Ratio: 2.3 — ABNORMAL HIGH (ref 1.2–2.2)
Albumin: 4.3 g/dL (ref 3.6–4.6)
Alkaline Phosphatase: 63 IU/L (ref 39–117)
BUN/Creatinine Ratio: 20 (ref 12–28)
BUN: 15 mg/dL (ref 8–27)
Bilirubin Total: 0.3 mg/dL (ref 0.0–1.2)
CO2: 22 mmol/L (ref 20–29)
Calcium: 9.2 mg/dL (ref 8.7–10.3)
Chloride: 105 mmol/L (ref 96–106)
Creatinine, Ser: 0.74 mg/dL (ref 0.57–1.00)
GFR calc Af Amer: 87 mL/min/{1.73_m2} (ref >59)
GFR calc non Af Amer: 75 mL/min/{1.73_m2} (ref >59)
Globulin, Total: 1.9 g/dL (ref 1.5–4.5)
Glucose: 93 mg/dL (ref 65–99)
Potassium: 4 mmol/L (ref 3.5–5.2)
Sodium: 140 mmol/L (ref 134–144)
Total Protein: 6.2 g/dL (ref 6.0–8.5)

## 2019-03-01 LAB — TSH: TSH: 0.296 u[IU]/mL — ABNORMAL LOW (ref 0.450–4.500)

## 2019-03-01 NOTE — Telephone Encounter (Signed)
Patient is returning phone call.  Patient phone numbers is [phone] h or [phone] c.

## 2019-03-01 NOTE — Telephone Encounter (Signed)
The order that was placed on 02/16/19 was for a download and cpap pressure change.  That is the only order placed this year.  In 01/2018 order was placed for new machine.  Will need an order for supplies to be placed.

## 2019-03-01 NOTE — Telephone Encounter (Signed)
Called and spoke to pt. Pt states she received her CPAP and supplies through [facility] not [facility]. Called [facility] and spoke with Bethanne Ginger and was advised pt did indeed receive her pap and supplies through [facility].   PCC's can you send most recent CPAP order to [facility] and not [facility]. She is in fact a [facility] pt. Thanks.

## 2019-03-01 NOTE — Telephone Encounter (Signed)
Order for CPAP supplies has been placed.   Will close this encounter.

## 2019-03-02 ENCOUNTER — Other Ambulatory Visit: Payer: Self-pay

## 2019-03-02 DIAGNOSIS — G4733 Obstructive sleep apnea (adult) (pediatric): Secondary | ICD-10-CM

## 2019-03-06 NOTE — Progress Notes (Signed)
Chief Complaint  Patient presents with  . Hypothyroidism    nonfasting 6 month med check. Still has pain in her left side. Pt got COVID vaccine today-cannot abstract yet.  . Back Pain    when she is doing work, has been bothering her off and on 2-3 months.    Patient presents for 6 month med check.   Her husband was hospitalized last month, had gall bladder removed and diagnosed with new onset atrial fibrillation.  He is feeling better.  Today she is having some low back pain, usually after a lot of cleaning, washing.  She was concerned it could be her kidneys.  Heat helps ease the pain. Denies urgency, frequency, odor to the urine, or dysuria.  Hypothyroidism: She takes her medication on an empty stomach, separate from food and other medications. She is compliant with taking Synthroid, at 63mg. She had labs rechecked prior to her visit today, see below. She denies changes in weight, hair/nail/skin or bowels.  She feels a little more tired, slightly depressed, related to being inside so much (and husband being in the hospital).  She continues to have loose stools though is much better than in the past, taking probiotic daily which helps a lot.  (10/2017 was given a trial of Questran with meals. She didn't like how she felt (stomach felt full, a little constipated, only uses it with spicy foods, or foods which would upset her stomach).  Mixed hyperlipidemia: She is compliant with taking Crestor, and denies side effects (previously took pravastatin which wasn't getting her to goal for TG; previously didn't tolerate lipitor. She continues on fish oil(2/d),and tries to follow lowfat, low cholesterol diet.She is also taking Coenzyme Q10 daily.  Hassome aching in her legs which feels better when she wears compression stockings.  Atrial fibrillation: She remains on blood thinners without any bleeding or complications. She sees Dr. JMartiniqueevery 6 months, last in 09/2018.No med changes were  made.She gets winded when she walks, and notices that her heart is beating faster, has no associated chest pain She has an upcoming appointment with Dr. JMartinique  HTN:  Occasionally checks BP, runs around 120/70-80. Pulse runs around 75-80.No headaches, dizziness, chest pain. She needs to get up slowly in the morning, to avoid dizziness.  OSA: She is using CPAP, and doing well, under the care ofDr. Sood. She saw him last month, and making some changes to help with nasal dryness. 2 weeks later she got a call [place] the wrong [facility], [place] [facility]  She thinks she got it straightened out, and nose feels less dry over the last week. She continues to feel more refreshed since being on CPAP.   Allergies--She uses claritin and flonase with fairly good results.She uses saline regularly.   Hoarseness:  This was evaluated by Dr. TBenjamine Mola and evaluation showed edema, c/w laryngopharyngeal reflux.  He increased her omeprazole to BID, and did f/u evaluation 2 months later.  She didn't notice any improvement in symptoms, he felt like there was some improvement in the edema.  [facility], which she completed She was told it was related to her thyroid treatment (from voice specialist). She was [facility], so she did, and she denies any recurrent reflux symptoms.  If she drinks a lot of water, her voice improves.  Osteopenia--She restarted Boniva after her bone density 04/2014. It showed osteopenia, but elevated FRAX score (probability of  a major osteoporotic fracture is 21%, hip fracture is 5.1%). At one point she stopped taking Boniva forabout 4 months(she didn't feel like it was helping, since thefollow-upDEXA looked the same). Not stopped due to side effects or problems.Sherestarted monthly Bonivain8/2018, but later reported going off again for about 6 months (prior to 09/2018 visit; had some trouble swallowing the pill). At  her last visit, she reported since drinking more water, she wasn't having any further difficulty swallowing pills. 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  She restarted the [facility] after her bone density test, to complete another year, if tolerated. Denies dysphagia, chest pain. Every once in a while she has trouble eating meat.  PMH, PSH, SH reviewed  Outpatient Encounter Medications as of 03/07/2019  Medication Sig Note  . Calcium Carbonate-Vitamin D (CALCIUM 600 + D PO) Take 2 tablets by mouth daily.     . cholestyramine (QUESTRAN) 4 g packet Take 1 packet (4 g total) by mouth 3 (three) times daily with meals. 09/01/2018: Only uses a few times a month  . co-enzyme Q-10 30 MG capsule Take 30 mg by mouth daily.     Marland Kitchen ELIQUIS 2.5 MG TABS tablet Take 1 tablet by mouth twice daily   . fluticasone (FLONASE) 50 MCG/ACT nasal spray USE 2 SPRAY(S) IN EACH NOSTRIL ONCE DAILY   . ibandronate (BONIVA) 150 MG tablet TAKE ONE TABLET BY MOUTH ONCE EVERY MONTH.  TAKE WITH A FULL GLASS OF WATER AND DO NOT EAT OR LAY DOWN FOR 30 MINUTES.   Marland Kitchen loratadine (CLARITIN) 10 MG tablet Take 10 mg by mouth daily.   . metoprolol tartrate (LOPRESSOR) 25 MG tablet Take 1 tablet (25 mg total) by mouth 2 (two) times daily.   . Misc Natural Products (OSTEO BI-FLEX JOINT SHIELD PO) Take 2 tablets by mouth daily.   . Multiple Vitamin (MULTI-VITAMIN) tablet Take 1 tablet by mouth daily.     . Multiple Vitamins-Minerals (ICAPS PO) Take 1 tablet by mouth daily.   . Omega-3 Fatty Acids (FISH OIL OMEGA-3 PO) Take 3,600 mg by mouth daily.  03/07/2019: Takes 2/day (got higher dose per pt, so cut back from 3)  . PREVIDENT 5000 PLUS 1.1 % CREA dental cream See admin instructions.   . Probiotic Product (PROBIOTIC PO) Take 1 capsule by mouth daily.   . rosuvastatin (CRESTOR) 20 MG tablet Take 1 tablet by mouth once daily   . SYNTHROID 75 MCG tablet TAKE 1 TABLET BY MOUTH ONCE DAILY BEFORE BREAKFAST   . [DISCONTINUED] lactase (LACTAID) 3000  UNITS tablet Take 3,000 Units by mouth 3 (three) times daily with meals. As needed   . fluocinonide gel (LIDEX) 0.05 % APPLY TO AFFECTED AREA 4 TIMES DAILY AS NEEDED    No facility-administered encounter medications on file as of 03/07/2019.   No Known Allergies   ROS: no fever, chills, URI symptoms, cough. Allergies are controlled, per HPI. No headaches, dizziness, chest pain, palpitations, syncope. Does have some heart racing and shortness of breath with activity. Has upcoming appt with Dr. Martinique. No bleeding, bruising, rash. Moods are okay, slightly down per HPI Loose stools per HPI, improved overall. No urinary complaints, just the low back discomfort, per HPI.   PHYSICAL EXAM:  BP 130/70   Pulse 60   Temp (!) 97.3 F (36.3 C) (Other (Comment))   Ht 4' 9.5" (1.461 m)   Wt 127 lb 6.4 oz (57.8 kg)   BMI 27.09 kg/m   Wt Readings from Last  3 Encounters:  03/07/19 127 lb 6.4 oz (57.8 kg)  02/16/19 127 lb (57.6 kg)  09/13/18 127 lb (57.6 kg)    Well appearing, pleasant female in no distress HEENT: conjunctiva and sclera are clear, EOMI. Wearing mask due to COVID-19 pandemic (nose/OP not examined) Neck: no lymphadenopathy or thyromegaly, no bruit Heart: regular rate and rhythm, no murmur Lungs: clear bilaterally Back: no CVA or spinal tenderness (area of discomfort is lower R back, nontender, no spasm noted) Extremities: no edema, normal pulses. Neuro: alert and oriented. Normal gait Psych: normal mood, affect, hygiene and grooming Skin: normal turgor, no rash/lesions   Lab Results  Component Value Date   CHOL 127 02/28/2019   HDL 47 02/28/2019   LDLCALC 59 02/28/2019   TRIG 120 02/28/2019   CHOLHDL 2.7 02/28/2019   Fasting glucose 93    Chemistry      Component Value Date/Time   NA 140 02/28/2019 0932   K 4.0 02/28/2019 0932   CL 105 02/28/2019 0932   CO2 22 02/28/2019 0932   BUN 15 02/28/2019 0932   CREATININE 0.74 02/28/2019 0932   CREATININE 0.83  04/21/2016 0852      Component Value Date/Time   CALCIUM 9.2 02/28/2019 0932   ALKPHOS 63 02/28/2019 0932   AST 22 02/28/2019 0932   ALT 20 02/28/2019 0932   BILITOT 0.3 02/28/2019 0932     Lab Results  Component Value Date   WBC 4.5 02/28/2019   HGB 12.6 02/28/2019   HCT 37.1 02/28/2019   MCV 98 (H) 02/28/2019   PLT 208 02/28/2019   Lab Results  Component Value Date   TSH 0.296 (L) 02/28/2019   TSH 0.708 in 08/2018 (also on Synthroid 54mg)  Urine dip: trace leuks, otherwise negative    ASSESSMENT/PLAN:  Mixed hyperlipidemia - at goal; continue current meds  Postablative hypothyroidism - over-replaced. Decrease Synthroid dose to 538m and schedule TSH check in 2 mos - Plan: SYNTHROID 50 MCG tablet, TSH  Paroxysmal atrial fibrillation (HCC) - on anticoagulants without complication. in NSR today.  Has f/u with cardiologist soon  Obstructive sleep apnea - compliant with CPAP  Osteopenia of neck of left femur - cont Boniva. Discussed Ca, D, weight-bearing exercise  Allergic rhinitis, unspecified seasonality, unspecified trigger - controlled  Long term (current) use of anticoagulants  Low back pain, unspecified back pain laterality, unspecified chronicity, unspecified whether sciatica present - reassured no e/o UTI, muscular.  Heat, stretch, massage - Plan: POCT Urinalysis DIP (Proadvantage Device)  Medication monitoring encounter - Plan: TSH   Decrease Synthroid to 5021m Recheck TSH in 2-3 months (lab only, nonfasting)  Lab visit 2-3 mos TSH ONLY F/u 6 months for AWV/med check (usually like her fasting labs prior, will need c-met, CBC, TSH--not ordered today as I don't want them released when she comes for TSH in 2 mos).  COVID vaccine--got today.

## 2019-03-07 ENCOUNTER — Other Ambulatory Visit: Payer: Self-pay

## 2019-03-07 ENCOUNTER — Encounter: Payer: Self-pay | Admitting: Family Medicine

## 2019-03-07 ENCOUNTER — Ambulatory Visit (INDEPENDENT_AMBULATORY_CARE_PROVIDER_SITE_OTHER): Payer: Medicare Other | Admitting: Family Medicine

## 2019-03-07 VITALS — BP 130/70 | HR 60 | Temp 97.3°F | Ht <= 58 in | Wt 127.4 lb

## 2019-03-07 DIAGNOSIS — M85852 Other specified disorders of bone density and structure, left thigh: Secondary | ICD-10-CM

## 2019-03-07 DIAGNOSIS — E890 Postprocedural hypothyroidism: Secondary | ICD-10-CM | POA: Diagnosis not present

## 2019-03-07 DIAGNOSIS — M545 Low back pain, unspecified: Secondary | ICD-10-CM

## 2019-03-07 DIAGNOSIS — Z7901 Long term (current) use of anticoagulants: Secondary | ICD-10-CM

## 2019-03-07 DIAGNOSIS — G4733 Obstructive sleep apnea (adult) (pediatric): Secondary | ICD-10-CM

## 2019-03-07 DIAGNOSIS — I480 Paroxysmal atrial fibrillation: Secondary | ICD-10-CM | POA: Diagnosis not present

## 2019-03-07 DIAGNOSIS — E782 Mixed hyperlipidemia: Secondary | ICD-10-CM

## 2019-03-07 DIAGNOSIS — J309 Allergic rhinitis, unspecified: Secondary | ICD-10-CM

## 2019-03-07 DIAGNOSIS — Z5181 Encounter for therapeutic drug level monitoring: Secondary | ICD-10-CM

## 2019-03-07 DIAGNOSIS — Z23 Encounter for immunization: Secondary | ICD-10-CM | POA: Diagnosis not present

## 2019-03-07 LAB — POCT URINALYSIS DIP (PROADVANTAGE DEVICE)
Bilirubin, UA: NEGATIVE
Blood, UA: NEGATIVE
Glucose, UA: NEGATIVE mg/dL
Ketones, POC UA: NEGATIVE mg/dL
Nitrite, UA: NEGATIVE
Protein Ur, POC: NEGATIVE mg/dL
Specific Gravity, Urine: 1.015
Urobilinogen, Ur: NEGATIVE
pH, UA: 6 (ref 5.0–8.0)

## 2019-03-07 MED ORDER — SYNTHROID 50 MCG PO TABS: 50.0000 ug | ORAL_TABLET | Freq: Every day | ORAL | 0 refills | Status: DC

## 2019-03-07 NOTE — Patient Instructions (Signed)
We are lowering the dose of your Synthroid to .  We sent in a new prescription to your pharmacy. Return in 2 months to recheck your thyroid test (you don't have to fast for this blood test).  Continue your other medications the same.  Be sure to mention your shortness of breath and heart racing to Dr. [name].

## 2019-03-09 ENCOUNTER — Telehealth: Payer: Self-pay | Admitting: Pulmonary Disease

## 2019-03-09 DIAGNOSIS — G4733 Obstructive sleep apnea (adult) (pediatric): Secondary | ICD-10-CM

## 2019-03-09 NOTE — Telephone Encounter (Signed)
Called the patient back, she stated she was contacted by [facility] about her CPAP and told them she did not purchase it from them but from [place].  Based on the referrals listed there was an order placed to [facility] 11/18/17:  "She needs new auto CPAP machine.  Her current device is more than 84 yrs old.  She needs auto CPAP range 5 to 15 cm H2O with heated humidity.  She will also need mask refit.  Please arrange to change DMEs to [facility] so she can continue to work with Mardelle Matte."  However on 02/17/18 an orde was set to [facility]:  "DME-[facility]; Pt is needing to be enrolled into airview, and download for cpap machine is needing to be faxed to the office at fax [phone] as soon as possible. Thank you."  Based on this, the orders regarding her CPAP were sent to [facility]  In error and should have been sent to [facility], where she has also been receiving her supplies.  I advised the patient the agencies will be called to get this clarified so the settings can be applied to the cpap machine. Had to repeat this several times to the patient until she voiced understanding.  I called [facility] and was able to confirm the patient purchased the machine from [facility], however, [facility] has taken over monitoring of the machine. Confirmed that based on this the order for the setting change needs to be sent to [facility].   I called the patient and made her aware. Patient voiced understanding.  Order resent to the attention of [facility]. Nothing further needed.

## 2019-03-15 NOTE — Progress Notes (Signed)
Abigail Day Date of Birth: [birth year] Medical Record #[id number]  History of Present Illness: Abigail Day is seen for follow up of Afib. She has a history of paroxysmal atrial fib - on Eliquis. EF is normal per echo back in December of 2013. Normal Myoview in June 2015. Other issues include HLD, hypothyroidism with prior ablation for hyperthyroidism, and diverticulosis. She is  on CPAP therapy for sleep apnea-followed by pulmonary. In 2019 she had some scapular pain and dyspnea. We repeated an Echo and Myoview study which were unremarkable.  On follow up today she states her thyroid dose has gotten out of whack. TSH was low. She has been experiencing increased episodes of tachycardia which may 15 minutes up to 2-3 hours. No dizziness. She does have dyspnea on exertion. Has been less active with Covid pandemic   Current Outpatient Medications on File Prior to Visit  Medication Sig Dispense Refill  . Calcium Carbonate-Vitamin D (CALCIUM 600 + D PO) Take 2 tablets by mouth daily.      Marland Kitchen co-enzyme Q-10 30 MG capsule Take 30 mg by mouth daily.      Marland Kitchen ELIQUIS 2.5 MG TABS tablet Take 1 tablet by mouth twice daily 180 tablet 1  . fluticasone (FLONASE) 50 MCG/ACT nasal spray USE 2 SPRAY(S) IN EACH NOSTRIL ONCE DAILY 48 g 3  . ibandronate (BONIVA) 150 MG tablet TAKE ONE TABLET BY MOUTH ONCE EVERY MONTH.  TAKE WITH A FULL GLASS OF WATER AND DO NOT EAT OR LAY DOWN FOR 30 MINUTES. 3 tablet 3  . loratadine (CLARITIN) 10 MG tablet Take 10 mg by mouth daily.    . metoprolol tartrate (LOPRESSOR) 25 MG tablet Take 1 tablet (25 mg total) by mouth 2 (two) times daily. 180 tablet 3  . Misc Natural Products (OSTEO BI-FLEX JOINT SHIELD PO) Take 2 tablets by mouth daily.    . Multiple Vitamin (MULTI-VITAMIN) tablet Take 1 tablet by mouth daily.      . Multiple Vitamins-Minerals (ICAPS PO) Take 1 tablet by mouth daily.    . Omega-3 Fatty Acids (FISH OIL OMEGA-3 PO) Take 3,600 mg by mouth daily.     Marland Kitchen PREVIDENT 5000 PLUS  1.1 % CREA dental cream See admin instructions.  3  . Probiotic Product (PROBIOTIC PO) Take 1 capsule by mouth daily.    . rosuvastatin (CRESTOR) 20 MG tablet Take 1 tablet by mouth once daily 90 tablet 0  . SYNTHROID 50 MCG tablet Take 1 tablet (50 mcg total) by mouth daily before breakfast. 90 tablet 0  . cholestyramine (QUESTRAN) 4 g packet Take 1 packet (4 g total) by mouth 3 (three) times daily with meals. 60 each 2  . fluocinonide gel (LIDEX) 0.05 % APPLY TO AFFECTED AREA 4 TIMES DAILY AS NEEDED     No current facility-administered medications on file prior to visit.    No Known Allergies  Past Medical History:  Diagnosis Date  . Atrial fibrillation ([place])   . Diverticulosis   . Dyspnea on exertion 02/18/2012  . Eye exam abnormal 05/08/15   Dr.Groat-glaucoma suspect  . Glaucoma suspect   . Hemorrhoids    internal and external  . Hemorrhoids 09/25/10   internal and external  . Hyperplastic colon polyp 09/25/10   Dr. Watt Climes  . Osteopenia   . Palpitations 02/18/2012  . Paroxysmal atrial fibrillation (HCC)   . Pure hypercholesterolemia   . Sleep apnea   . Unspecified hypothyroidism    s/p ablation for hyperthyroidism  .  Urge urinary incontinence     Past Surgical History:  Procedure Laterality Date  . ABDOMINAL HYSTERECTOMY  age 35   with BSO and bladder tack  . COLONOSCOPY  11/06, 09/25/10, 12/2015   hyperplastic polyp 2012;   . ESOPHAGOGASTRODUODENOSCOPY  09/25/10   tortuous esophagus, intact Nissen fundoplication, dilated the spasm at esophageal GE junction  . EYE SURGERY     bilateral cataract surgery  . LAPAROSCOPIC NISSEN FUNDOPLICATION  8/07   Dr. Daphine Deutscher  . LARYNGOSCOPY  11/24/2017   dx.GERD,start omeprazole    Social History   Tobacco Use  Smoking Status Never Smoker  Smokeless Tobacco Never Used    Social History   Substance and Sexual Activity  Alcohol Use Yes  . Alcohol/week: 0.0 standard drinks   Comment: once a month or less    Family History   Problem Relation Age of Onset  . Heart disease Mother   . Heart disease Father   . Diabetes Father   . Marfan syndrome Sister   . Heart disease Sister        valve replacement  . Heart disease Brother   . Diabetes Brother   . Heart disease Brother   . Diabetes Brother   . Heart disease Brother   . Heart disease Brother   . Heart disease Brother        CABG  . Heart disease Brother        CABG  . Heart disease Sister        pacemaker  . Aortic aneurysm Sister   . Peripheral Artery Disease Sister        amputation of toes  . Breast cancer Sister 60  . Heart disease Sister        CABG at 32  . Colon cancer Sister 22  . Heart disease Sister        CABG  . Heart disease Sister   . Heart disease Brother        stent  . Heart disease Sister   . Hyperlipidemia Sister   . Stroke Sister        in 1948, mild    Review of Systems: The review of systems is per the HPI.  All other systems were reviewed and are negative.  Physical Exam: BP 140/72   Pulse 64   Ht 4\' 11"  (1.499 m)   Wt 129 lb 6.4 oz (58.7 kg)   SpO2 98%   BMI 26.14 kg/m  GENERAL:  [name] in NAD HEENT:  PERRL, EOMI, sclera are clear. Oropharynx is clear. NECK:  No jugular venous distention, carotid upstroke brisk and symmetric, no bruits, no thyromegaly or adenopathy LUNGS:  Clear to auscultation bilaterally CHEST:  Unremarkable HEART:  RRR,  PMI not displaced or sustained,S1 and S2 within normal limits, no S3, no S4: no clicks, no rubs, no murmurs ABD:  Soft, nontender. BS +, no masses or bruits. No hepatomegaly, no splenomegaly EXT:  2 + pulses throughout, no edema, no cyanosis no clubbing SKIN:  Warm and dry.  No rashes NEURO:  Alert and oriented x 3. Cranial nerves II through XII intact. PSYCH:  Cognitively intact    LABORATORY DATA:     Lab Results  Component Value Date   WBC 4.5 02/28/2019   HGB 12.6 02/28/2019   HCT 37.1 02/28/2019   PLT 208 02/28/2019   GLUCOSE 93  02/28/2019   CHOL 127 02/28/2019   TRIG 120 02/28/2019   HDL 47 02/28/2019  LDLCALC 59 02/28/2019   ALT 20 02/28/2019   AST 22 02/28/2019   NA 140 02/28/2019   K 4.0 02/28/2019   CL 105 02/28/2019   CREATININE 0.74 02/28/2019   BUN 15 02/28/2019   CO2 22 02/28/2019   TSH 0.296 (L) 02/28/2019   HGBA1C 5.4 09/24/2015    Echo 09/23/17: Study Conclusions  - Left ventricle: The cavity size was normal. Wall thickness was   normal. Systolic function was normal. The estimated ejection   fraction was in the range of 60% to 65%. Doppler parameters are   consistent with abnormal left ventricular relaxation (grade 1   diastolic dysfunction). The Ee&' ratio is between 8-15, suggesting   indeterminate LV filling pressure. - Left atrium: The atrium was normal in size. - Tricuspid valve: There was mild regurgitation. - Pulmonary arteries: PA peak pressure: 27 mm Hg (S). - Inferior vena cava: The vessel was normal in size. The   respirophasic diameter changes were in the normal range (>= 50%),   consistent with normal central venous pressure.  Impressions:  - Compared to a prior study in 2013, the LVEF is unchanged. Left   and right atria measure normal in size.  Myoview 09/29/17: Study Highlights     Nuclear stress EF: 55%.  No T wave inversion was noted during stress.  There was no ST segment deviation noted during stress.  The study is normal.  This is a low risk study.   Normal perfusion. LVEF 55% with normal wall motion. This is a low risk study. No change compared to prior study.     Assessment / Plan:  1. Obstructive sleep apnea. On CPAP therapy  2. PAF - treated with rate control with metoprolol and Eliquis.  She may be having more breakthrough arrhythmia due to elevated thyroid levels. Thyroid dose just reduced last week. May take extra metoprolol as needed for racing. If tachycardia persists after thyroid levels normalize may need to have her wear an event  monitor.  3. Hypercholesterolemia. Most recent lipid panel showed excellent LDL of 59 on Crestor.   Follow up in 6 months.

## 2019-03-21 ENCOUNTER — Ambulatory Visit (INDEPENDENT_AMBULATORY_CARE_PROVIDER_SITE_OTHER): Payer: Medicare Other | Admitting: Cardiology

## 2019-03-21 ENCOUNTER — Encounter: Payer: Self-pay | Admitting: Cardiology

## 2019-03-21 ENCOUNTER — Other Ambulatory Visit: Payer: Self-pay

## 2019-03-21 VITALS — BP 140/72 | HR 64 | Ht 59.0 in | Wt 129.4 lb

## 2019-03-21 DIAGNOSIS — I480 Paroxysmal atrial fibrillation: Secondary | ICD-10-CM | POA: Diagnosis not present

## 2019-03-21 DIAGNOSIS — R0609 Other forms of dyspnea: Secondary | ICD-10-CM

## 2019-03-21 DIAGNOSIS — E782 Mixed hyperlipidemia: Secondary | ICD-10-CM | POA: Diagnosis not present

## 2019-03-21 DIAGNOSIS — R0600 Dyspnea, unspecified: Secondary | ICD-10-CM | POA: Diagnosis not present

## 2019-03-21 NOTE — Patient Instructions (Addendum)
Medication Instructions:  NO CHANGES *If you need a refill on your cardiac medications before your next appointment, please call your pharmacy*  Lab Work: NONE If you have labs (blood work) drawn today and your tests are completely normal, you will receive your results only by: Marland Kitchen MyChart Message (if you have MyChart) OR . A paper copy in the mail If you have any lab test that is abnormal or we need to change your treatment, we will call you to review the results.    Follow-Up: At [facility], you and your health needs are our priority.  As part of our continuing mission to provide you with exceptional heart care, we have created designated Provider Care Teams.  These Care Teams include your primary Cardiologist (physician) and Advanced Practice Providers (APPs -  Physician Assistants and Nurse Practitioners) who all work together to provide you with the care you need, when you need it.  Your next appointment:   6 month(s)  The format for your next appointment:   In Person  Provider:   Armonte Tortorella [name], MD  Other Instructions  If your heart is racing you can take an extra metoprolol  It heart racing persists after getting your thyroid under control let me know.

## 2019-03-30 ENCOUNTER — Ambulatory Visit
Admission: RE | Admit: 2019-03-30 | Discharge: 2019-03-30 | Disposition: A | Discharge status: Home / Self Care | Payer: Medicare Other | Source: Ambulatory Visit | Attending: Family Medicine | Admitting: Family Medicine

## 2019-03-30 ENCOUNTER — Other Ambulatory Visit: Payer: Self-pay

## 2019-03-30 DIAGNOSIS — Z1231 Encounter for screening mammogram for malignant neoplasm of breast: Secondary | ICD-10-CM | POA: Diagnosis not present

## 2019-04-04 DIAGNOSIS — Z23 Encounter for immunization: Secondary | ICD-10-CM | POA: Diagnosis not present

## 2019-04-05 ENCOUNTER — Other Ambulatory Visit: Payer: Self-pay | Admitting: Family Medicine

## 2019-04-05 DIAGNOSIS — E782 Mixed hyperlipidemia: Secondary | ICD-10-CM

## 2019-04-12 ENCOUNTER — Other Ambulatory Visit: Payer: Self-pay | Admitting: Family Medicine

## 2019-04-12 DIAGNOSIS — E782 Mixed hyperlipidemia: Secondary | ICD-10-CM

## 2019-04-26 ENCOUNTER — Other Ambulatory Visit: Payer: Self-pay | Admitting: Cardiovascular Disease

## 2019-05-11 ENCOUNTER — Other Ambulatory Visit: Payer: Medicare Other

## 2019-05-11 ENCOUNTER — Telehealth: Payer: Self-pay | Admitting: Family Medicine

## 2019-05-11 ENCOUNTER — Other Ambulatory Visit: Payer: Self-pay

## 2019-05-11 DIAGNOSIS — Z5181 Encounter for therapeutic drug level monitoring: Secondary | ICD-10-CM | POA: Diagnosis not present

## 2019-05-11 DIAGNOSIS — E890 Postprocedural hypothyroidism: Secondary | ICD-10-CM

## 2019-05-11 NOTE — Telephone Encounter (Signed)
Called and left VM for pt

## 2019-05-11 NOTE — Telephone Encounter (Signed)
Lipids were at goal on last check, so the labs that are going to be done in July do NOT need to be fasting.  We can draw them at her visit. Please advise

## 2019-05-11 NOTE — Telephone Encounter (Signed)
Pt is coming in 09/14/2019 at 1.45 for her wellness visit and she wants to know if she needs to come in sooner for her fasting labs

## 2019-05-12 ENCOUNTER — Other Ambulatory Visit: Payer: Self-pay | Admitting: Family Medicine

## 2019-05-12 DIAGNOSIS — Z7901 Long term (current) use of anticoagulants: Secondary | ICD-10-CM

## 2019-05-12 DIAGNOSIS — Z5181 Encounter for therapeutic drug level monitoring: Secondary | ICD-10-CM

## 2019-05-12 DIAGNOSIS — E890 Postprocedural hypothyroidism: Secondary | ICD-10-CM

## 2019-05-12 LAB — TSH: TSH: 5.83 u[IU]/mL — ABNORMAL HIGH (ref 0.450–4.500)

## 2019-05-24 DIAGNOSIS — H02834 Dermatochalasis of left upper eyelid: Secondary | ICD-10-CM | POA: Diagnosis not present

## 2019-05-24 DIAGNOSIS — H40012 Open angle with borderline findings, low risk, left eye: Secondary | ICD-10-CM | POA: Diagnosis not present

## 2019-05-24 DIAGNOSIS — H04123 Dry eye syndrome of bilateral lacrimal glands: Secondary | ICD-10-CM | POA: Diagnosis not present

## 2019-05-24 DIAGNOSIS — H02831 Dermatochalasis of right upper eyelid: Secondary | ICD-10-CM | POA: Diagnosis not present

## 2019-05-24 DIAGNOSIS — Z961 Presence of intraocular lens: Secondary | ICD-10-CM | POA: Diagnosis not present

## 2019-05-24 DIAGNOSIS — H401111 Primary open-angle glaucoma, right eye, mild stage: Secondary | ICD-10-CM | POA: Diagnosis not present

## 2019-05-24 DIAGNOSIS — H353131 Nonexudative age-related macular degeneration, bilateral, early dry stage: Secondary | ICD-10-CM | POA: Diagnosis not present

## 2019-06-30 ENCOUNTER — Other Ambulatory Visit: Payer: Self-pay | Admitting: Family Medicine

## 2019-06-30 DIAGNOSIS — E890 Postprocedural hypothyroidism: Secondary | ICD-10-CM

## 2019-07-06 ENCOUNTER — Encounter: Payer: Self-pay | Admitting: *Deleted

## 2019-07-15 ENCOUNTER — Other Ambulatory Visit: Payer: Self-pay | Admitting: Pharmacist Clinician (PhC)/ Clinical Pharmacy Specialist

## 2019-07-18 ENCOUNTER — Telehealth: Payer: Self-pay | Admitting: Cardiology

## 2019-07-18 MED ORDER — APIXABAN 2.5 MG PO TABS: 2.5000 mg | ORAL_TABLET | Freq: Two times a day (BID) | ORAL | 3 refills | Status: DC

## 2019-07-18 NOTE — Telephone Encounter (Signed)
Prescription refill request for Eliquis received. Indication: Last office visit: 03/21/19 Scr: 0.74  Age: 86 Weight: 58.7 kg  Refill authorized

## 2019-07-18 NOTE — Telephone Encounter (Signed)
Will forward to anticoag clinic for renewal

## 2019-07-18 NOTE — Telephone Encounter (Signed)
Pt c/o medication issue:  1. Name of Medication: ELIQUIS 2.5 MG TABS tablet  2. How are you currently taking this medication (dosage and times per day)? As directed  3. Are you having a reaction (difficulty breathing--STAT)? no  4. What is your medication issue? Patient states that she needs authorization on this medication because [facility] will not refill it for her. She states she is completely out and will need a refill sent to [facility] [address] [place], [place] - [address] once it has been authorized.

## 2019-09-09 NOTE — Progress Notes (Signed)
Abigail Day Date of Birth: 09-26-1933 Medical Record #[id number]  History of Present Illness: Abigail Day is seen for follow up of Afib. She has a history of paroxysmal atrial fib - on Eliquis. EF is normal per echo back in December of 2013. Normal Myoview in June 2015. Other issues include HLD, hypothyroidism with prior ablation for hyperthyroidism, and diverticulosis. She is  on CPAP therapy for sleep apnea-followed by pulmonary. In 2019 she had some scapular pain and dyspnea. We repeated an Echo and Myoview study which were unremarkable.  On follow up today she has rare palpitations in the morning. Notes some numbness on the lateral aspect of her right leg. Numbness in feet with walking. Notes CPAP machine has been recalled but she hasn't gotten a replacement yet. Some SOB with exertion.   Current Outpatient Medications on File Prior to Visit  Medication Sig Dispense Refill  . apixaban (ELIQUIS) 2.5 MG TABS tablet Take 1 tablet (2.5 mg total) by mouth 2 (two) times daily. 90 tablet 3  . Calcium Carbonate-Vitamin D (CALCIUM 600 + D PO) Take 2 tablets by mouth daily.      . cholestyramine (QUESTRAN) 4 g packet Take 1 packet (4 g total) by mouth 3 (three) times daily with meals. 60 each 2  . co-enzyme Q-10 30 MG capsule Take 30 mg by mouth daily.      . fluocinonide gel (LIDEX) 0.05 % APPLY TO AFFECTED AREA 4 TIMES DAILY AS NEEDED    . fluticasone (FLONASE) 50 MCG/ACT nasal spray USE 2 SPRAY(S) IN EACH NOSTRIL ONCE DAILY 48 g 3  . loratadine (CLARITIN) 10 MG tablet Take 10 mg by mouth daily.    . metoprolol tartrate (LOPRESSOR) 25 MG tablet Take 1 tablet (25 mg total) by mouth 2 (two) times daily. 180 tablet 3  . Misc Natural Products (OSTEO BI-FLEX JOINT SHIELD PO) Take 2 tablets by mouth daily.    . Multiple Vitamin (MULTI-VITAMIN) tablet Take 1 tablet by mouth daily.      . Multiple Vitamins-Minerals (ICAPS PO) Take 1 tablet by mouth daily.    . Omega-3 Fatty Acids (FISH OIL OMEGA-3 PO) Take  3,600 mg by mouth daily.     Marland Kitchen PREVIDENT 5000 PLUS 1.1 % CREA dental cream See admin instructions.  3  . Probiotic Product (PROBIOTIC PO) Take 1 capsule by mouth daily.    . rosuvastatin (CRESTOR) 20 MG tablet Take 1 tablet by mouth once daily 90 tablet 0  . SYNTHROID 50 MCG tablet TAKE 1 TABLET BY MOUTH ONCE DAILY BEFORE BREAKFAST 90 tablet 0   No current facility-administered medications on file prior to visit.    No Known Allergies  Past Medical History:  Diagnosis Date  . Atrial fibrillation (HCC)   . Diverticulosis   . Dyspnea on exertion 02/18/2012  . Eye exam abnormal 05/08/15   Dr.Groat-glaucoma suspect  . Glaucoma suspect   . Hemorrhoids    internal and external  . Hemorrhoids 09/25/10   internal and external  . Hyperplastic colon polyp 09/25/10   Dr. Ewing Schlein  . Osteopenia   . Palpitations 02/18/2012  . Paroxysmal atrial fibrillation (HCC)   . Pure hypercholesterolemia   . Sleep apnea   . Unspecified hypothyroidism    s/p ablation for hyperthyroidism  . Urge urinary incontinence     Past Surgical History:  Procedure Laterality Date  . ABDOMINAL HYSTERECTOMY  age 35   with BSO and bladder tack  . COLONOSCOPY  11/06, 09/25/10, 12/2015  hyperplastic polyp 2012;   . ESOPHAGOGASTRODUODENOSCOPY  09/25/10   tortuous esophagus, intact Nissen fundoplication, dilated the spasm at esophageal GE junction  . EYE SURGERY     bilateral cataract surgery  . LAPAROSCOPIC NISSEN FUNDOPLICATION  8/07   Dr. Daphine Deutscher  . LARYNGOSCOPY  11/24/2017   dx.GERD,start omeprazole    Social History   Tobacco Use  Smoking Status Never Smoker  Smokeless Tobacco Never Used    Social History   Substance and Sexual Activity  Alcohol Use Yes  . Alcohol/week: 0.0 standard drinks   Comment: once a month or less    Family History  Problem Relation Age of Onset  . Heart disease Mother   . Heart disease Father   . Diabetes Father   . Marfan syndrome Sister   . Heart disease Sister         valve replacement  . Heart disease Brother   . Diabetes Brother   . Heart disease Brother   . Diabetes Brother   . Heart disease Brother   . Heart disease Brother   . Heart disease Brother        CABG  . Heart disease Brother        CABG  . Heart disease Sister        pacemaker  . Aortic aneurysm Sister   . Peripheral Artery Disease Sister        amputation of toes  . Breast cancer Sister 51  . Heart disease Sister        CABG at 36  . Colon cancer Sister 57  . Heart disease Sister        CABG  . Heart disease Sister   . Heart disease Brother        stent  . Heart disease Sister   . Hyperlipidemia Sister   . Stroke Sister        in 1948, mild    Review of Systems: The review of systems is per the HPI.  All other systems were reviewed and are negative.  Physical Exam: BP 130/73   Pulse (!) 59   Temp (!) 96.8 F (36 C)   Ht 4\' 10"  (1.473 m)   Wt 131 lb 9.6 oz (59.7 kg)   SpO2 94%   BMI 27.50 kg/m  GENERAL:  [name] in NAD HEENT:  PERRL, EOMI, sclera are clear. Oropharynx is clear. NECK:  No jugular venous distention, carotid upstroke brisk and symmetric, no bruits, no thyromegaly or adenopathy LUNGS:  Clear to auscultation bilaterally CHEST:  Unremarkable HEART:  RRR,  PMI not displaced or sustained,S1 and S2 within normal limits, no S3, no S4: no clicks, no rubs, no murmurs ABD:  Soft, nontender. BS +, no masses or bruits. No hepatomegaly, no splenomegaly EXT:  2 + pulses throughout, no edema, no cyanosis no clubbing SKIN:  Warm and dry.  No rashes NEURO:  Alert and oriented x 3. Cranial nerves II through XII intact. PSYCH:  Cognitively intact    LABORATORY DATA:     Lab Results  Component Value Date   WBC 3.8 09/12/2019   HGB 12.9 09/12/2019   HCT 39.1 09/12/2019   PLT 211 09/12/2019   GLUCOSE 100 (H) 09/12/2019   CHOL 127 02/28/2019   TRIG 120 02/28/2019   HDL 47 02/28/2019   LDLCALC 59 02/28/2019   ALT 24 09/12/2019    AST 25 09/12/2019   NA 136 09/12/2019   K 4.3 09/12/2019  CL 102 09/12/2019   CREATININE 0.92 09/12/2019   BUN 18 09/12/2019   CO2 21 09/12/2019   TSH 9.990 (H) 09/12/2019   HGBA1C 5.4 09/24/2015   Ecg today shows NSR rate 59. Normal. I have personally reviewed and interpreted this study.   Echo 09/23/17: Study Conclusions  - Left ventricle: The cavity size was normal. Wall thickness was   normal. Systolic function was normal. The estimated ejection   fraction was in the range of 60% to 65%. Doppler parameters are   consistent with abnormal left ventricular relaxation (grade 1   diastolic dysfunction). The Ee&' ratio is between 8-15, suggesting   indeterminate LV filling pressure. - Left atrium: The atrium was normal in size. - Tricuspid valve: There was mild regurgitation. - Pulmonary arteries: PA peak pressure: 27 mm Hg (S). - Inferior vena cava: The vessel was normal in size. The   respirophasic diameter changes were in the normal range (>= 50%),   consistent with normal central venous pressure.  Impressions:  - Compared to a prior study in 2013, the LVEF is unchanged. Left   and right atria measure normal in size.  Myoview 09/29/17: Study Highlights     Nuclear stress EF: 55%.  No T wave inversion was noted during stress.  There was no ST segment deviation noted during stress.  The study is normal.  This is a low risk study.   Normal perfusion. LVEF 55% with normal wall motion. This is a low risk study. No change compared to prior study.     Assessment / Plan:  1. Obstructive sleep apnea. On CPAP therapy  2. PAF - treated with rate control with metoprolol and Eliquis.  Infrequent episodes. Now in NSR. Continue current therapy  3. Hypercholesterolemia. Most recent lipid panel showed excellent LDL of 59 on Crestor.  4. Hypothyroidism. TSH up to 9.9. anticipate Dr Lynelle Doctor will increase dose.    Follow up in 6 months.

## 2019-09-12 ENCOUNTER — Other Ambulatory Visit: Payer: Self-pay

## 2019-09-12 ENCOUNTER — Other Ambulatory Visit: Payer: Medicare Other

## 2019-09-12 DIAGNOSIS — D7589 Other specified diseases of blood and blood-forming organs: Secondary | ICD-10-CM | POA: Diagnosis not present

## 2019-09-12 DIAGNOSIS — Z7901 Long term (current) use of anticoagulants: Secondary | ICD-10-CM | POA: Diagnosis not present

## 2019-09-12 DIAGNOSIS — Z5181 Encounter for therapeutic drug level monitoring: Secondary | ICD-10-CM | POA: Diagnosis not present

## 2019-09-12 DIAGNOSIS — E890 Postprocedural hypothyroidism: Secondary | ICD-10-CM

## 2019-09-13 ENCOUNTER — Ambulatory Visit (INDEPENDENT_AMBULATORY_CARE_PROVIDER_SITE_OTHER): Payer: Medicare Other | Admitting: Cardiology

## 2019-09-13 ENCOUNTER — Encounter: Payer: Self-pay | Admitting: Cardiology

## 2019-09-13 VITALS — BP 130/73 | HR 59 | Temp 96.8°F | Ht <= 58 in | Wt 131.6 lb

## 2019-09-13 DIAGNOSIS — G4733 Obstructive sleep apnea (adult) (pediatric): Secondary | ICD-10-CM | POA: Diagnosis not present

## 2019-09-13 DIAGNOSIS — I480 Paroxysmal atrial fibrillation: Secondary | ICD-10-CM | POA: Diagnosis not present

## 2019-09-13 DIAGNOSIS — E782 Mixed hyperlipidemia: Secondary | ICD-10-CM

## 2019-09-13 LAB — COMPREHENSIVE METABOLIC PANEL
ALT: 24 IU/L (ref 0–32)
AST: 25 IU/L (ref 0–40)
Albumin/Globulin Ratio: 2.3 — ABNORMAL HIGH (ref 1.2–2.2)
Albumin: 4.5 g/dL (ref 3.6–4.6)
Alkaline Phosphatase: 64 IU/L (ref 48–121)
BUN/Creatinine Ratio: 20 (ref 12–28)
BUN: 18 mg/dL (ref 8–27)
Bilirubin Total: 0.4 mg/dL (ref 0.0–1.2)
CO2: 21 mmol/L (ref 20–29)
Calcium: 9.8 mg/dL (ref 8.7–10.3)
Chloride: 102 mmol/L (ref 96–106)
Creatinine, Ser: 0.92 mg/dL (ref 0.57–1.00)
GFR calc Af Amer: 66 mL/min/{1.73_m2} (ref >59)
GFR calc non Af Amer: 57 mL/min/{1.73_m2} — ABNORMAL LOW (ref >59)
Globulin, Total: 2 g/dL (ref 1.5–4.5)
Glucose: 100 mg/dL — ABNORMAL HIGH (ref 65–99)
Potassium: 4.3 mmol/L (ref 3.5–5.2)
Sodium: 136 mmol/L (ref 134–144)
Total Protein: 6.5 g/dL (ref 6.0–8.5)

## 2019-09-13 LAB — CBC WITH DIFFERENTIAL/PLATELET
Basophils Absolute: 0 10*3/uL (ref 0.0–0.2)
Basos: 1 %
EOS (ABSOLUTE): 0.1 10*3/uL (ref 0.0–0.4)
Eos: 2 %
Hematocrit: 39.1 % (ref 34.0–46.6)
Hemoglobin: 12.9 g/dL (ref 11.1–15.9)
Immature Grans (Abs): 0 10*3/uL (ref 0.0–0.1)
Immature Granulocytes: 0 %
Lymphocytes Absolute: 1.2 10*3/uL (ref 0.7–3.1)
Lymphs: 32 %
MCH: 32.9 pg (ref 26.6–33.0)
MCHC: 33 g/dL (ref 31.5–35.7)
MCV: 100 fL — ABNORMAL HIGH (ref 79–97)
Monocytes Absolute: 0.5 10*3/uL (ref 0.1–0.9)
Monocytes: 12 %
Neutrophils Absolute: 2 10*3/uL (ref 1.4–7.0)
Neutrophils: 53 %
Platelets: 211 10*3/uL (ref 150–450)
RBC: 3.92 x10E6/uL (ref 3.77–5.28)
RDW: 12.8 % (ref 11.7–15.4)
WBC: 3.8 10*3/uL (ref 3.4–10.8)

## 2019-09-13 LAB — TSH: TSH: 9.99 u[IU]/mL — ABNORMAL HIGH (ref 0.450–4.500)

## 2019-09-13 NOTE — Progress Notes (Signed)
Chief Complaint  Patient presents with  . other    CPE/ AWV    Abigail Day is a 86 y.o. female who presents for Medicare annual wellness visit and follow-up on chronic medical conditions.   She has the following concerns:  She has some pain at her R lateral hip when walking.  Heat helps. She has noticed this pain x 6 months. She also gets some intermittent numbness at her right lateral mid-calf, and along the bottom of her feet (along the metatarsals), usually with walking.  The foot numbness has been going on for years. Rubbing a salve on it helps with the numbness on the feet.  Hypothyroidism: She takes her medication on an empty stomach, separate from food and other medications. She is compliant with taking Synthroid.  Her dose of Synthroid was decreased from 65mg to 548m in 03/2019 due to TSH being suppressed (0.296). Repeat in March 2021 on 5041mdose, TSH was 5.830.  She had no symptoms at that time, and elected to continue same dose and repeat in 3 mos.  She had labs rechecked prior to her visit today, see below. She is now getting more fatigued "I give out" with activity, have to sit down and rest.  Nails have been more brittle. Denies bowel changes. No significant weight change, slight gain (2# since January per chart).  Loose stools are much less frequent, not as loose and not as frequent, since taking probiotic regularly.  Mixed hyperlipidemia: She is compliant with takingCrestor, and denies side effects(previously took pravastatin which wasn't getting her to goal for TG;previously didn't tolerate lipitor) She continues on fish oil(2/d),and tries to follow lowfat, low cholesterol diet.She is also taking Coenzyme Q10 daily.  Hassome aching in her legs which feels better when she wears compression stockings. She wears them while exercising and walking, and if up on her feet a lot. Lipids were at goal on last check: Lab Results  Component Value Date   CHOL 127 02/28/2019    HDL 47 02/28/2019   LDLCALC 59 02/28/2019   TRIG 120 02/28/2019   CHOLHDL 2.7 02/28/2019    Atrial fibrillation: She remains on blood thinners without any bleeding or complications. She sees Dr. JorMartiniqueery 6 months, seen yesterday.No med changes were made.  HTN:  Occasionally checks BP, runs around 113-120/60-70. Pulse runs around 75. No headaches, dizziness, chest pain. She needs to get up slowly in the morning, to avoid dizziness.  OSA: She is using CPAP, and doing well, under the care ofDr. Sood.  She continues to feel more refreshed since being on CPAP, though not quite as much recently. There is a recall on her CPAP; she has been in contact with the company, and states they are waiting on the FDA to approve the parts (to repair).    Allergies--She uses claritin and flonase with fairly good results.She uses saline regularly.  Hoarseness:  This remains the same, not as bad as in the past . Drinking a lot of water helps.  She previously saw Dr. TeoBenjamine Molaiagnosed with laryngopharyngeal reflux and increased her omeprazole dose to BID. He noted some improvement in edema, but there was no change in her symptoms.[facility], which she completed. She was told it was related to her thyroid treatment (from voice specialist). She [facility] and she denies any recurrent reflux symptoms.  If she drinks a lot of  water, her voice improves. She continues to do home exercises.  Osteopenia--She restarted Boniva after her bone density 04/2014.It showed osteopenia, but elevated FRAX score (probability of a major osteoporotic fracture is 21%,hip fracture is 5.1%). At one point she stopped takingBonivaforabout 4 months(she didn't feel like it was helping, since thefollow-upDEXA looked the same). Not stopped due to side effects or problems.Sherestarted monthly Bonivain8/2018, but later reported going  off again for about 6 months (prior to 09/2018 visit; had some trouble swallowing the pill). At her last visit, she reported since drinking more water, she wasn't having any further difficulty swallowing pills. 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 She restarted the [facility] after her bone density test, to complete another year, if tolerated.  She took it up until last month (took it the end of June).  She finished her last prescription.  She noticed that her bones have been hurting a little more the last few months, and doesn't want to take it anymore. Denies dysphagia, chest pain. No longer having problems swallowing meat (used to happen occasionally, better since drinking more water.   Immunization History  Administered Date(s) Administered  . Fluad Quad(high Dose 65+) 12/29/2018  . Influenza Split 01/08/2011, 01/08/2012  . Influenza, High Dose Seasonal PF 12/08/2012, 12/19/2013, 12/21/2014, 12/13/2015, 10/23/2016, 12/14/2017  . Moderna SARS-COVID-2 Vaccination 03/07/2019, 04/04/2019  . Pneumococcal Conjugate-13 03/27/2014  . Pneumococcal Polysaccharide-23 01/31/2002, 01/08/2012  . Td 10/01/2004  . Tdap 01/08/2012  . Zoster 06/01/2008  . Zoster Recombinat (Shingrix) 07/07/2017, 10/18/2017   Last Pap smear: n/a  Last mammogram: 03/2019 Last colonoscopy:12/2015, negative biopsy. No f/u needed Last DEXA:11/2018 T-1.5 at L fem neck. Ophtho: yearly Dentist: every 9 months  Exercise: walking 40-45 minutes daily. Goes to weight room (bicycle and elliptical, and some weights) x 45 minutes3 x/week  Other doctors caring for patient include: Cardiology: Dr. Martinique  Dermatologist: Dr. Pati Gallo), hasn't been recently Ophtho: Dr. Katy Fitch  Dentist: Dr. Lavone Neri  Pulmonary (for OSA): Dr. Halford Chessman GI: Dr. Watt Climes ENT: Dr. Benjamine Mola Voice (speech pathologist) Abigail Day at [facility]  Depression screen: negative Fall Screen: negative Functional Status Screen: unremarkable Mini-Cog screen: Normal 3  word recall.Marland Kitchen Spacing of numbers on clock were a little off (spread out at first, and cramped (12 and 5 were top and bottom).  See full screens in epic  End of Life Discussion: Patient hasa living will and medical power of attorney   PMH, [place], [place] and [facility] reviewed and updated  Outpatient Encounter Medications as of 09/14/2019  Medication Sig Note  . apixaban (ELIQUIS) 2.5 MG TABS tablet Take 1 tablet (2.5 mg total) by mouth 2 (two) times daily.   . Calcium Carbonate-Vitamin D (CALCIUM 600 + D PO) Take 2 tablets by mouth daily.     Marland Kitchen co-enzyme Q-10 30 MG capsule Take 30 mg by mouth daily.     . fluticasone (FLONASE) 50 MCG/ACT nasal spray USE 2 SPRAY(S) IN EACH NOSTRIL ONCE DAILY   . loratadine (CLARITIN) 10 MG tablet Take 10 mg by mouth daily.   . metoprolol tartrate (LOPRESSOR) 25 MG tablet Take 1 tablet (25 mg total) by mouth 2 (two) times daily.   . Misc Natural Products (OSTEO BI-FLEX JOINT SHIELD PO) Take 2 tablets by mouth daily.   . Multiple Vitamin (MULTI-VITAMIN) tablet Take 1 tablet by mouth daily.     . Multiple Vitamins-Minerals (ICAPS PO) Take 1 tablet by mouth daily.   . Omega-3 Fatty Acids (FISH OIL OMEGA-3 PO) Take 3,600 mg by  mouth daily.  03/07/2019: Takes 2/day (got higher dose per pt, so cut back from 3)  . PREVIDENT 5000 PLUS 1.1 % CREA dental cream See admin instructions.   . Probiotic Product (PROBIOTIC PO) Take 1 capsule by mouth daily.   . rosuvastatin (CRESTOR) 20 MG tablet Take 1 tablet by mouth once daily   . SYNTHROID 50 MCG tablet TAKE 1 TABLET BY MOUTH ONCE DAILY BEFORE BREAKFAST   . cholestyramine (QUESTRAN) 4 g packet Take 1 packet (4 g total) by mouth 3 (three) times daily with meals. (Patient not taking: Reported on 09/14/2019) 09/01/2018: Only uses a few times a month  . fluocinonide gel (LIDEX) 0.05 % APPLY TO AFFECTED AREA 4 TIMES DAILY AS NEEDED (Patient not taking: Reported on 09/14/2019)   . [DISCONTINUED] ibandronate (BONIVA) 150 MG tablet TAKE ONE  TABLET BY MOUTH ONCE EVERY MONTH.  TAKE WITH A FULL GLASS OF WATER AND DO NOT EAT OR LAY DOWN FOR 30 MINUTES.    No facility-administered encounter medications on file as of 09/14/2019.    ROS: The patient denies anorexia, fever, headaches, vision changes, decreased hearing, ear pain, sore throat, breast concerns, chest pain, syncope; denies cough, swelling, nausea, vomiting, constipation, abdominal pain, melena, hematochezia, indigestion/heartburn, dysphagia, vaginal bleeding, discharge, odor or itch, genital lesions, numbness, tingling, weakness, tremor, suspicious skin lesions, depression, anxiety, abnormal bleeding/bruising, or enlarged lymph nodes.  Some trouble remembering names, but it comes faster now than it did when she first moved. Occasional LBP, under the R shoulderblade.  She was given stretches from the therapist, which has helped. Occasional R knee pain after a lot of walking. Hoarseness per HPI, chronic/unchanged Numbness in feet with walking, and at night.  Using topical creams help. Rare palpitations in the mornings, races just slightly (mentioned to cardiologist yesterday). She states eye doctor noted retinal bubble--checking her vision frequently at home and to f/u if any worsening noted.   PHYSICAL EXAM:  BP 130/70   Pulse 62   Temp 98.2 F (36.8 C)   Ht _0  (1.448 m)   Wt 131 lb 6.4 oz (59.6 kg)   SpO2 95%   BMI 28.43 kg/m   Wt Readings from Last 3 Encounters:  09/14/19 131 lb 6.4 oz (59.6 kg)  09/13/19 131 lb 9.6 oz (59.7 kg)  03/21/19 129 lb 6.4 oz (58.7 kg)    General Appearance:  Alert, cooperative, no distress, appears stated age. Voice is hoarse, unchanged from prior visits  Head:  Normocephalic, without obvious abnormality, atraumatic   Eyes:  PERRL, conjunctiva/corneas clear, EOM's intact, fundi benign   Ears:  Normal TM's and external ear canals   Nose:  Not examined (wearing mask for COVID-19 pandemic)  Throat:  Not examined  (wearing mask for COVID-19 pandemic)  Neck:  Supple, no lymphadenopathy; thyroid: no enlargement/ tenderness/nodules; no carotid bruit or JVD   Back:  Spine nontender, no CVA tenderness. +kyphosis and scoliosis. Tender R paraspinous muscles, lumbar, and below R scapula  Lungs:  Clear to auscultation bilaterally without wheezes, rales or ronchi; respirations unlabored   Chest Wall:  No tenderness or deformity   Heart:  Regular rate and rhythm, S1 and S2 normal, no murmur, rub or gallop.  Breast Exam:  No tenderness, masses, or nipple discharge or inversion. No axillary lymphadenopathy   Abdomen:  Soft, nondistended, nontender, normoactive bowel sounds, no hepatosplenomegaly or masses.  Genitalia:  Normal external genitalia without lesions. BUS and vagina normal, atrophic changes noted; No abnormal vaginal discharge. Uterus and  adnexa surgically absent, no masses. Pap not performed   Rectal:  Normal tone, no masses or tenderness; guaiac negative stool   Extremities:  No clubbing, cyanosis or edema. 2+ pulses.Tender R trochanteric bursa. Nontender along IT band  Pulses:  2+ and symmetric all extremities   Skin:  Skin color, texture, turgor normal, no lesions or rash  Lymph nodes:  Cervical, supraclavicular, and axillary nodes normal   Neurologic:  CNII-XII intact, normal strength, sensation and gait; reflexes 2+ and symmetric throughout. Alert and oriented.   Psych: Normal mood, affect, hygiene and grooming   Lab Results  Component Value Date   TSH 9.990 (H) 09/12/2019   Lab Results  Component Value Date   WBC 3.8 09/12/2019   HGB 12.9 09/12/2019   HCT 39.1 09/12/2019   MCV 100 (H) 09/12/2019   PLT 211 09/12/2019     Chemistry      Component Value Date/Time   NA 136 09/12/2019 0931   K 4.3 09/12/2019 0931   CL 102 09/12/2019 0931   CO2 21 09/12/2019 0931   BUN 18 09/12/2019 0931   CREATININE 0.92 09/12/2019  0931   CREATININE 0.83 04/21/2016 0852      Component Value Date/Time   CALCIUM 9.8 09/12/2019 0931   ALKPHOS 64 09/12/2019 0931   AST 25 09/12/2019 0931   ALT 24 09/12/2019 0931   BILITOT 0.4 09/12/2019 0931     Fasting glu 100   ASSESSMENT/PLAN:  Medicare annual wellness visit, subsequent  Postablative hypothyroidism - not adequately replaced on 71mg daily.  Increase back to 779m and recheck in 6-8 weeks - Plan: SYNTHROID 75 MCG tablet  Long term (current) use of anticoagulants - no e/o bleeding or complications  Paroxysmal atrial fibrillation (HCC) - slight palpitations/racing in morning.  NSR now. No changes per cardiology visit yesterday  Obstructive sleep apnea - continue CPAP; using while waiting for recall/repair  Osteopenia of neck of left femur - She no longer wants to take bisphosphonate. Discussed Ca, D, wt bearing exercise. f/u DEXA in 11/2020  Allergic rhinitis, unspecified seasonality, unspecified trigger - controlled  Chronic hoarseness - stable, somewhat improved when drinking a lot of water  Mixed hyperlipidemia - Cont rosuvastatin, low cholesterol, lowfat diet. - Plan: rosuvastatin (CRESTOR) 20 MG tablet  Greater trochanteric bursitis of right hip - If not improving with heat, advised she can return for cortisone injection.  add on B12 since still tingling in feet; MCV up at 100  Discussed monthly self breast exams and yearly mammograms; at least 30 minutes of aerobic activity at least 5 days/week, weight-bearing exercise at least 2x/week; proper sunscreen use reviewed; healthy diet, including goals of calcium and vitamin D intake and alcohol recommendations (less than or equal to 1 drink/day) reviewed; regular seatbelt use; changing batteries in smoke detectors. Immunization recommendations discussed--UTD.Continue yearly high dose flu shots.Colonoscopy recommendations reviewed, UTD.  MOST form reviewed and updated, full care.  Lab visit only in 6-8 wks  for TSH  F/u 6 months Lipids, c-met, CBC, TSH  Medicare Attestation I have personally reviewed: The patient's medical and social history Their use of alcohol, tobacco or illicit drugs Their current medications and supplements The patient's functional ability including ADLs,fall risks, home safety risks, cognitive, and hearing and visual impairment Diet and physical activities Evidence for depression or mood disorders  The patient's weight, height, BMI have been recorded in the chart.  I have made referrals, counseling, and provided education to the patient based on review of the above  and I have provided the patient with a written personalized care plan for preventive services.

## 2019-09-13 NOTE — Patient Instructions (Addendum)
HEALTH MAINTENANCE RECOMMENDATIONS:  It is recommended that you get at least 30 minutes of aerobic exercise at least 5 days/week (for weight loss, you may need as much as 60-90 minutes). This can be any activity that gets your heart rate up. This can be divided in 10-15 minute intervals if needed, but try and build up your endurance at least once a week.  Weight bearing exercise is also recommended twice weekly.  Eat a healthy diet with lots of vegetables, fruits and fiber.  "Colorful" foods have a lot of vitamins (ie green vegetables, tomatoes, red peppers, etc).  Limit sweet tea, regular sodas and alcoholic beverages, all of which has a lot of calories and sugar.  Up to 1 alcoholic drink daily may be beneficial for women (unless trying to lose weight, watch sugars).  Drink a lot of water.  Calcium recommendations are 1200-1500 mg daily (1500 mg for postmenopausal women or women without ovaries), and vitamin D 1000 IU daily.  This should be obtained from diet and/or supplements (vitamins), and calcium should not be taken all at once, but in divided doses.  Monthly self breast exams and yearly mammograms for women over the age of 26 is recommended.  Sunscreen of at least SPF 30 should be used on all sun-exposed parts of the skin when outside between the hours of 10 am and 4 pm (not just when at beach or pool, but even with exercise, golf, tennis, and yard work!)  Use a sunscreen that says "broad spectrum" so it covers both UVA and UVB rays, and make sure to reapply every 1-2 hours.  Remember to change the batteries in your smoke detectors when changing your clock times in the spring and fall. Carbon monoxide detectors are recommended for your home.  Use your seat belt every time you are in a car, and please drive safely and not be distracted with cell phones and texting while driving.   Abigail Day , Thank you for taking time to come for your Medicare Wellness Visit. I appreciate your ongoing  commitment to your health goals. Please review the following plan we discussed and let me know if I can assist you in the future.   This is a list of the screening recommended for you and due dates:  Health Maintenance  Topic Date Due  . Flu Shot  10/02/2019  . Tetanus Vaccine  01/07/2022  . DEXA scan (bone density measurement)  Completed  . COVID-19 Vaccine  Completed  . Pneumonia vaccines  Completed   Next bone density test will be due 11/2020 (2 year follow-up). Continue yearly high dose flu shots in the Fall (not 10/02/2019 as stated above, can be September/October)  We are changing your Synthroid dose back up to . Return in 6-8 weeks to recheck the thyroid test (you can eat that day).  You will come for another visit in 6 months.  You will need to call when it is closer to that visit to set up a lab visit, if you want them done prior to your January appointment.  You have hip bursitis.  If it bothers you enough, you can call and schedule an appointment here for a cortisone shot.   Hip Bursitis  Hip bursitis is inflammation of a fluid-filled sac (bursa) in the hip joint. The bursa prevents the bones in the hip joint from rubbing against each other. Hip bursitis can cause mild to moderate pain, and symptoms often come and go over time. What are the causes?  This condition may be caused by:  Injury to the hip.  Overuse of the muscles that surround the hip joint.  Previous injury or surgery of the hip.  Arthritis or gout.  Diabetes.  Thyroid disease.  Infection. In some cases, the cause may not be known. What are the signs or symptoms? Symptoms of this condition include:  Mild or moderate pain in the hip area. Pain may get worse with movement.  Tenderness and swelling of the hip, especially on the outer side of the hip.  In rare cases, the bursa may become infected. This may cause a fever, as well as warmth and redness in the area. Symptoms may come and go. How is  this diagnosed? This condition may be diagnosed based on:  A physical exam.  Your medical history.  X-rays.  Removal of fluid from your inflamed bursa for testing (biopsy). You may be sent to a health care provider who specializes in bone diseases (orthopedist) or a provider who specializes in joint inflammation (rheumatologist). How is this treated? This condition is treated by resting, icing, applying pressure (compression), and raising (elevating) the injured area. This is called RICE treatment. In some cases, this may be enough to make your symptoms go away. Treatment may also include:  Using crutches.  Draining fluid out of the bursa to help relieve swelling.  Injecting medicine that helps to reduce inflammation (cortisone).  Additional medicines if the bursa is infected. Follow these instructions at home: Managing pain, stiffness, and swelling   If directed, put ice on the painful area. ? Put ice in a plastic bag. ? Place a towel between your skin and the bag. ? Leave the ice on for 20 minutes, 2-3 times a day. ? Raise (elevate) your hip above the level of your heart as much as you can without pain. To do this, try putting a pillow under your hips while you lie down. Activity  Return to your normal activities as told by your health care provider. Ask your health care provider what activities are safe for you.  Rest and protect your hip as much as possible until your pain and swelling get better. General instructions  Take over-the-counter and prescription medicines only as told by your health care provider.  Wear compression wraps only as told by your health care provider.  Do not use your hip to support your body weight until your health care provider says that you can. Use crutches as told by your health care provider.  Gently massage and stretch your injured area as often as is comfortable.  Keep all follow-up visits as told by your health care provider. This is  important. How is this prevented?  Exercise regularly, as told by your health care provider.  Warm up and stretch before being active.  Cool down and stretch after being active.  If an activity irritates your hip or causes pain, avoid the activity as much as possible.  Avoid sitting down for long periods at a time. Contact a health care provider if you:  Have a fever.  Develop new symptoms.  Have difficulty walking or doing everyday activities.  Have pain that gets worse or does not get better with medicine.  Develop red skin or a feeling of warmth in your hip area. Get help right away if you:  Cannot move your hip.  Have severe pain. Summary  Hip bursitis is inflammation of a fluid-filled sac (bursa) in the hip joint.  Hip bursitis can cause mild to  moderate pain, and symptoms often come and go over time.  This condition is treated with rest, ice, compression, elevation, and medicines. This information is not intended to replace advice given to you by your health care provider. Make sure you discuss any questions you have with your health care provider. Document Revised: 10/26/2017 Document Reviewed: 10/26/2017 Elsevier Patient Education  2020 [employer].

## 2019-09-14 ENCOUNTER — Ambulatory Visit (INDEPENDENT_AMBULATORY_CARE_PROVIDER_SITE_OTHER): Payer: Medicare Other | Admitting: Family Medicine

## 2019-09-14 ENCOUNTER — Other Ambulatory Visit: Payer: Self-pay

## 2019-09-14 ENCOUNTER — Encounter: Payer: Self-pay | Admitting: Family Medicine

## 2019-09-14 VITALS — BP 130/70 | HR 62 | Temp 98.2°F | Ht <= 58 in | Wt 131.4 lb

## 2019-09-14 DIAGNOSIS — G4733 Obstructive sleep apnea (adult) (pediatric): Secondary | ICD-10-CM | POA: Diagnosis not present

## 2019-09-14 DIAGNOSIS — M7061 Trochanteric bursitis, right hip: Secondary | ICD-10-CM | POA: Diagnosis not present

## 2019-09-14 DIAGNOSIS — M85852 Other specified disorders of bone density and structure, left thigh: Secondary | ICD-10-CM | POA: Diagnosis not present

## 2019-09-14 DIAGNOSIS — Z0000 Encounter for general adult medical examination without abnormal findings: Secondary | ICD-10-CM | POA: Diagnosis not present

## 2019-09-14 DIAGNOSIS — Z7901 Long term (current) use of anticoagulants: Secondary | ICD-10-CM

## 2019-09-14 DIAGNOSIS — I480 Paroxysmal atrial fibrillation: Secondary | ICD-10-CM

## 2019-09-14 DIAGNOSIS — E890 Postprocedural hypothyroidism: Secondary | ICD-10-CM | POA: Diagnosis not present

## 2019-09-14 DIAGNOSIS — J309 Allergic rhinitis, unspecified: Secondary | ICD-10-CM

## 2019-09-14 DIAGNOSIS — R490 Dysphonia: Secondary | ICD-10-CM

## 2019-09-14 DIAGNOSIS — E782 Mixed hyperlipidemia: Secondary | ICD-10-CM | POA: Diagnosis not present

## 2019-09-14 MED ORDER — SYNTHROID 75 MCG PO TABS: 75.0000 ug | ORAL_TABLET | Freq: Every day | ORAL | 0 refills | Status: DC

## 2019-09-14 MED ORDER — ROSUVASTATIN CALCIUM 20 MG PO TABS: 20.0000 mg | ORAL_TABLET | Freq: Every day | ORAL | 1 refills | Status: DC

## 2019-09-15 ENCOUNTER — Telehealth: Payer: Self-pay

## 2019-09-15 NOTE — Telephone Encounter (Signed)
B 12 added today . KH

## 2019-09-16 LAB — VITAMIN B12: Vitamin B-12: 474 pg/mL (ref 232–1245)

## 2019-09-16 LAB — SPECIMEN STATUS REPORT

## 2019-09-19 ENCOUNTER — Ambulatory Visit: Payer: Medicare Other | Admitting: Cardiology

## 2019-10-26 ENCOUNTER — Other Ambulatory Visit: Payer: Medicare Other

## 2019-10-26 ENCOUNTER — Encounter: Payer: Self-pay | Admitting: Family Medicine

## 2019-10-26 ENCOUNTER — Ambulatory Visit (INDEPENDENT_AMBULATORY_CARE_PROVIDER_SITE_OTHER): Payer: Medicare Other | Admitting: Family Medicine

## 2019-10-26 ENCOUNTER — Other Ambulatory Visit: Payer: Self-pay

## 2019-10-26 VITALS — BP 140/70 | HR 72 | Ht <= 58 in | Wt 129.0 lb

## 2019-10-26 DIAGNOSIS — E039 Hypothyroidism, unspecified: Secondary | ICD-10-CM | POA: Diagnosis not present

## 2019-10-26 DIAGNOSIS — M25551 Pain in right hip: Secondary | ICD-10-CM

## 2019-10-26 DIAGNOSIS — M7061 Trochanteric bursitis, right hip: Secondary | ICD-10-CM | POA: Diagnosis not present

## 2019-10-26 MED ORDER — LIDOCAINE-EPINEPHRINE 1 %-1:100000 IJ SOLN
1.5000 mL | Freq: Once | INTRAMUSCULAR | Status: AC
  Administered 2019-10-26: 1.5 mL via INTRADERMAL

## 2019-10-26 MED ORDER — TRIAMCINOLONE ACETONIDE 40 MG/ML IJ SUSP
20.0000 mg | Freq: Once | INTRAMUSCULAR | Status: AC
  Administered 2019-10-26: 20 mg via INTRAMUSCULAR

## 2019-10-26 MED ORDER — BUPIVACAINE-EPINEPHRINE 0.5% -1:200000 IJ SOLN
1.5000 mL | Freq: Once | INTRAMUSCULAR | Status: AC
  Administered 2019-10-26: 7.5 mg

## 2019-10-26 NOTE — Progress Notes (Signed)
Chief Complaint  Patient presents with  . Hip Pain    per note from [facility] 09/14/19-patient has greater trochanteric bursitis of right hip - If not improving with heat, advised she can return for cortisone injection.   Patient presents for evaluation/treatment of R hip bursitis.  This was noted at her [facility], and persists.  She is ready for injection today. She states that now the pain sometimes radiates down to her R knee.  PMH, PSH, SH reviewed  Outpatient Encounter Medications as of 10/26/2019  Medication Sig Note  . apixaban (ELIQUIS) 2.5 MG TABS tablet Take 1 tablet (2.5 mg total) by mouth 2 (two) times daily.   . Calcium Carbonate-Vitamin D (CALCIUM 600 + D PO) Take 2 tablets by mouth daily.     Marland Kitchen co-enzyme Q-10 30 MG capsule Take 30 mg by mouth daily.     . fluticasone (FLONASE) 50 MCG/ACT nasal spray USE 2 SPRAY(S) IN EACH NOSTRIL ONCE DAILY   . loratadine (CLARITIN) 10 MG tablet Take 10 mg by mouth daily.   . metoprolol tartrate (LOPRESSOR) 25 MG tablet Take 1 tablet (25 mg total) by mouth 2 (two) times daily.   . Misc Natural Products (OSTEO BI-FLEX JOINT SHIELD PO) Take 2 tablets by mouth daily.   . Multiple Vitamin (MULTI-VITAMIN) tablet Take 1 tablet by mouth daily.     . Multiple Vitamins-Minerals (ICAPS PO) Take 1 tablet by mouth daily.   . Omega-3 Fatty Acids (FISH OIL OMEGA-3 PO) Take 3,600 mg by mouth daily.  03/07/2019: Takes 2/day (got higher dose per pt, so cut back from 3)  . PREVIDENT 5000 PLUS 1.1 % CREA dental cream See admin instructions.   . Probiotic Product (PROBIOTIC PO) Take 1 capsule by mouth daily.   . rosuvastatin (CRESTOR) 20 MG tablet Take 1 tablet (20 mg total) by mouth daily.   Marland Kitchen SYNTHROID 75 MCG tablet Take 1 tablet (75 mcg total) by mouth daily before breakfast.   . cholestyramine (QUESTRAN) 4 g packet Take 1 packet (4 g total) by mouth 3 (three) times daily with meals. (Patient not taking: Reported on 10/26/2019) 09/01/2018: Only uses a few times a month  .  fluocinonide gel (LIDEX) 0.05 % APPLY TO AFFECTED AREA 4 TIMES DAILY AS NEEDED (Patient not taking: Reported on 09/14/2019)   . [EXPIRED] bupivacaine-EPINEPHrine (MARCAINE W/ EPI) 0.5% -1:200000 (with pres) injection 7.5 mg    . [EXPIRED] lidocaine-EPINEPHrine (XYLOCAINE W/EPI) 1 %-1:100000 (with pres) injection 1.5 mL    . [EXPIRED] triamcinolone acetonide (KENALOG-40) injection 20 mg     No facility-administered encounter medications on file as of 10/26/2019.   No Known Allergies  ROS: no fever, chills, URI symptoms, headaches, or other concerns. See HPI   PHYSICAL EXAM:  BP 140/70   Pulse 72   Ht 4\' 9"  (1.448 m)   Wt 129 lb (58.5 kg)   BMI 27.92 kg/m   Pleasant, elderly female in no distress HEENT: conjunctiva and sclera are clear, EOMI, wearing mask Extremities:  Tender at R trochanteric bursa. Also has some tenderness along the R IT band.  Procedure:  Informed consent obtained verbally, understands risks and alternatives, wishes to proceed. Ethyl chloride spray was applied, and then 20mg  of kenalog, along with marcaine and lidocaine were injected into the R trochanteric bursa. She tolerated the procedure well. She had good results, with resolution in pain.  ASSESSMENT/PLAN:  Trochanteric bursitis, right hip - tolerated injection today.  also has component of IT band syndrome. Stretches shown.  Rec PT if ongoing pain - Plan: lidocaine-EPINEPHrine (XYLOCAINE W/EPI) 1 %-1:100000 (with pres) injection 1.5 mL, bupivacaine-EPINEPHrine (MARCAINE W/ EPI) 0.5% -1:200000 (with pres) injection 7.5 mg, triamcinolone acetonide (KENALOG-40) injection 20 mg  Hypothyroidism, unspecified type - due for recheck of TSH today (was coming for lab visit, changed to OV for her hip injection) - Plan: TSH   TSH done today Needs future orders for January visit. Will need lab visit scheduled (and orders).

## 2019-10-26 NOTE — Patient Instructions (Signed)
Do the stretches as shown for the IT band (touching toes with your feet crossed, and hanging the upper leg over the side of the bed, laying on your side)  The injection today will not help with IT band pain (which goes down the side of the leg to the knee), it will only help with the pain over the bone at the hip.  If your pain isn't improving over the next few weeks, we can refer you for physical therapy, just let us know.   Iliotibial Band Syndrome  Iliotibial band syndrome (ITBS) is a condition that often causes knee pain. It can also cause pain in the outside of your hip, thigh, and knee. The iliotibial band is a strip of tissue that runs from the outside of your hip and down your thigh to the outside of your knee. Repeatedly bending and straightening your knee can irritate the iliotibial band. What are the causes? This condition is caused by inflammation and irritation from the friction of the iliotibial band moving over the thigh bone (femur) when you repeatedly bend and straighten your knee. What increases the risk? This condition is more likely to develop in people who:  Frequently change elevation during their workouts.  Run very long distances.  Recently increased the length or intensity of their workouts.  Run downhill often, or just started running downhill.  Ride a bike very far or often. You may also be at greater risk if you start a new workout routine without first warming up or if you have a job that requires you to bend, squat, or climb frequently. What are the signs or symptoms? Symptoms of this condition include:  Pain along the outside of your knee that may be worse with activity, especially running or going up and down stairs.  A "snapping" sensation over your knee.  Swelling on the outside of your knee.  Pain or a feeling of tightness in your hip. How is this diagnosed? This condition is diagnosed based on your symptoms, medical history, and physical exam.  You may also see a health care provider who specializes in reducing pain and increasing mobility (physical therapist). A physical therapist may do an exam to check your balance, movement, and way of walking or running (gait) to see whether the way you move could contribute to your injury. You may also have tests to measure your strength, flexibility, and range of motion. How is this treated? Treatment for this condition includes:  Resting and limiting exercise.  Returning to activities gradually.  Doing range-of-motion and strengthening exercises (physical therapy) as told by your health care provider.  Including low-impact activities, such as swimming, in your exercise routine. Follow these instructions at home:  If directed, apply ice to the injured area. ? Put ice in a plastic bag. ? Place a towel between your skin and the bag. ? Leave the ice on for 20 minutes, 2-3 times per day.  Return to your normal activities as told by your health care provider. Ask your health care provider what activities are safe for you.  Keep all follow-up visits with your health care provider. This is important. Contact a health care provider if:  Your pain does not improve or gets worse despite treatment. This information is not intended to replace advice given to you by your health care provider. Make sure you discuss any questions you have with your health care provider. Document Revised: 01/30/2017 Document Reviewed: 03/21/2016 Elsevier Patient Education  2020 [employer].  Hip  Bursitis  Hip bursitis is inflammation of a fluid-filled sac (bursa) in the hip joint. The bursa prevents the bones in the hip joint from rubbing against each other. Hip bursitis can cause mild to moderate pain, and symptoms often come and go over time. What are the causes? This condition may be caused by:  Injury to the hip.  Overuse of the muscles that surround the hip joint.  Previous injury or surgery of the  hip.  Arthritis or gout.  Diabetes.  Thyroid disease.  Infection. In some cases, the cause may not be known. What are the signs or symptoms? Symptoms of this condition include:  Mild or moderate pain in the hip area. Pain may get worse with movement.  Tenderness and swelling of the hip, especially on the outer side of the hip.  In rare cases, the bursa may become infected. This may cause a fever, as well as warmth and redness in the area. Symptoms may come and go. How is this diagnosed? This condition may be diagnosed based on:  A physical exam.  Your medical history.  X-rays.  Removal of fluid from your inflamed bursa for testing (biopsy). You may be sent to a health care provider who specializes in bone diseases (orthopedist) or a provider who specializes in joint inflammation (rheumatologist). How is this treated? This condition is treated by resting, icing, applying pressure (compression), and raising (elevating) the injured area. This is called RICE treatment. In some cases, this may be enough to make your symptoms go away. Treatment may also include:  Using crutches.  Draining fluid out of the bursa to help relieve swelling.  Injecting medicine that helps to reduce inflammation (cortisone).  Additional medicines if the bursa is infected. Follow these instructions at home: Managing pain, stiffness, and swelling   If directed, put ice on the painful area. ? Put ice in a plastic bag. ? Place a towel between your skin and the bag. ? Leave the ice on for 20 minutes, 2-3 times a day. ? Raise (elevate) your hip above the level of your heart as much as you can without pain. To do this, try putting a pillow under your hips while you lie down. Activity  Return to your normal activities as told by your health care provider. Ask your health care provider what activities are safe for you.  Rest and protect your hip as much as possible until your pain and swelling get  better. General instructions  Take over-the-counter and prescription medicines only as told by your health care provider.  Wear compression wraps only as told by your health care provider.  Do not use your hip to support your body weight until your health care provider says that you can. Use crutches as told by your health care provider.  Gently massage and stretch your injured area as often as is comfortable.  Keep all follow-up visits as told by your health care provider. This is important. How is this prevented?  Exercise regularly, as told by your health care provider.  Warm up and stretch before being active.  Cool down and stretch after being active.  If an activity irritates your hip or causes pain, avoid the activity as much as possible.  Avoid sitting down for long periods at a time. Contact a health care provider if you:  Have a fever.  Develop new symptoms.  Have difficulty walking or doing everyday activities.  Have pain that gets worse or does not get better with medicine.  Develop red skin or a feeling of warmth in your hip area. Get help right away if you:  Cannot move your hip.  Have severe pain. Summary  Hip bursitis is inflammation of a fluid-filled sac (bursa) in the hip joint.  Hip bursitis can cause mild to moderate pain, and symptoms often come and go over time.  This condition is treated with rest, ice, compression, elevation, and medicines. This information is not intended to replace advice given to you by your health care provider. Make sure you discuss any questions you have with your health care provider. Document Revised: 10/26/2017 Document Reviewed: 10/26/2017 Elsevier Patient Education  2020 [employer].

## 2019-10-27 LAB — TSH: TSH: 1.08 u[IU]/mL (ref 0.450–4.500)

## 2019-11-24 ENCOUNTER — Other Ambulatory Visit: Payer: Self-pay

## 2019-11-24 ENCOUNTER — Other Ambulatory Visit (INDEPENDENT_AMBULATORY_CARE_PROVIDER_SITE_OTHER): Payer: Medicare Other

## 2019-11-24 DIAGNOSIS — Z23 Encounter for immunization: Secondary | ICD-10-CM | POA: Diagnosis not present

## 2019-12-20 ENCOUNTER — Other Ambulatory Visit: Payer: Self-pay | Admitting: Family Medicine

## 2019-12-20 DIAGNOSIS — E890 Postprocedural hypothyroidism: Secondary | ICD-10-CM

## 2020-01-09 ENCOUNTER — Other Ambulatory Visit: Payer: Self-pay | Admitting: Family Medicine

## 2020-01-09 DIAGNOSIS — J309 Allergic rhinitis, unspecified: Secondary | ICD-10-CM

## 2020-01-10 DIAGNOSIS — Z23 Encounter for immunization: Secondary | ICD-10-CM | POA: Diagnosis not present

## 2020-02-17 ENCOUNTER — Other Ambulatory Visit: Payer: Self-pay

## 2020-02-17 ENCOUNTER — Ambulatory Visit (INDEPENDENT_AMBULATORY_CARE_PROVIDER_SITE_OTHER): Payer: Medicare Other | Admitting: Pulmonary Disease

## 2020-02-17 ENCOUNTER — Encounter: Payer: Self-pay | Admitting: Pulmonary Disease

## 2020-02-17 VITALS — BP 118/70 | HR 68 | Temp 98.3°F | Ht <= 58 in | Wt 129.2 lb

## 2020-02-17 DIAGNOSIS — J3489 Other specified disorders of nose and nasal sinuses: Secondary | ICD-10-CM | POA: Diagnosis not present

## 2020-02-17 DIAGNOSIS — Z9989 Dependence on other enabling machines and devices: Secondary | ICD-10-CM | POA: Diagnosis not present

## 2020-02-17 DIAGNOSIS — G4733 Obstructive sleep apnea (adult) (pediatric): Secondary | ICD-10-CM

## 2020-02-17 DIAGNOSIS — R040 Epistaxis: Secondary | ICD-10-CM

## 2020-02-17 NOTE — Patient Instructions (Signed)
Will have [facility] send a copy of your CPAP download  Can try increasing temperature setting on your CPAP humidifier  Call if you are still having trouble with nasal congestion and nose bleeds  Follow up in 1 year

## 2020-02-17 NOTE — Progress Notes (Signed)
[facility]  Chief Complaint  Patient presents with   Follow-up    31yr f/u for OSA. States her machine is one of the recalled machines. Increased dry nose.     Constitutional:  BP 118/70    Pulse 68    Temp 98.3 F (36.8 C) (Temporal)    Ht 4\' 9"  (1.448 m)    Wt 129 lb 3.2 oz (58.6 kg)    SpO2 97% Comment: on RA   BMI 27.96 kg/m   Past Medical History:  Hypothyroidism, HLD, PAF, Osteopenia, Colon polyp, Diverticulosis  Past Surgical History:  Her  has a past surgical history that includes Abdominal hysterectomy (age 62); Eye surgery; Colonoscopy (11/06, 09/25/10, 12/2015); Laparoscopic Nissen fundoplication (8/07); Esophagogastroduodenoscopy (09/25/10); and Laryngoscopy (11/24/2017).  Brief Summary:  Abigail Day is a 86 y.o. female with obstructive sleep apnea.      Subjective:   She has Respironics device.  Got replacement machine about 6 months ago.  She isn't sure if humidifier setting was adjusted.  She has nasal mask.  Has been getting nasal dryness and intermittent nose bleeds.  Has been using Ayr.  Physical Exam:   Appearance - well kempt   ENMT - no sinus tenderness, no oral exudate, no LAN, Mallampati 3 airway, no stridor, wears dentures, raspy voice  Respiratory - equal breath sounds bilaterally, no wheezing or rales  CV - s1s2 regular rate and rhythm, no murmurs  Ext - no clubbing, no edema  Skin - no rashes  Psych - normal mood and affect   Sleep Tests:   PSG 08/18/12 >> AHI 23, SpO2 low 81%  Auto CPAP8/19/19 to 11/17/17 >>used on 30 Of 30 nights with average 7 hrs 23 min. Average AHI 1.9 with median CPAP 9 and 95 th percentile CPAP 12 cm H2O. Air leak.  Cardiac Tests:   Echo 09/23/17 >> EF 60 to 65%, grade 1 DD, mild TR  Social History:  She  reports that she has never smoked. She has never used smokeless tobacco. She reports current alcohol use. She reports that she does not use drugs.  Family  History:  Her family history includes Aortic aneurysm in her sister; Breast cancer (age of onset: 59) in her sister; Colon cancer (age of onset: 26) in her sister; Diabetes in her brother, brother, and father; Heart disease in her brother, brother, brother, brother, brother, brother, brother, father, mother, sister, sister, sister, sister, sister, and sister; Hyperlipidemia in her sister; Marfan syndrome in her sister; Peripheral Artery Disease in her sister; Stroke in her sister.     Assessment/Plan:   Obstructive sleep apnea. - she reports compliance with CPAP and benefit from therapy - she uses [facility] for her DME - she received replacement Respironics machine - continue auto CPAP 5 to 12 cm H2O - will call her with download   CPAP rhinitis with dry nose and intermittent episodes of epistaxis. - continue Ayr, flonase - adjust humidifier on CPAP machine - if persists, then can try adding azelastine - advised her to not pick at scab in her nose after nose bleed  Hoarseness likely from reflux. - seen by Dr. 98 ENT and speech therapy at [facility]  Paroxysmal atrial fibrillation. - followed by Dr. [facility] with cardiology  Time Spent Involved in Patient Care on Day of Examination:  23 minutes  Follow up:  Patient Instructions  Will have [facility] send a copy of your CPAP download  Can try increasing temperature  setting on your CPAP humidifier  Call if you are still having trouble with nasal congestion and nose bleeds  Follow up in 1 year   Medication List:   Allergies as of 02/17/2020   No Known Allergies     Medication List       Accurate as of February 17, 2020 11:15 AM. If you have any questions, ask your nurse or doctor.        STOP taking these medications   fluocinonide gel 0.05 % Commonly known as: LIDEX Stopped by: Coralyn Helling, MD     TAKE these medications   apixaban 2.5 MG Tabs tablet Commonly known as: ELIQUIS Take 1 tablet (2.5 mg total) by mouth 2  (two) times daily.   CALCIUM 600 + D PO Take 2 tablets by mouth daily.   cholestyramine 4 g packet Commonly known as: Questran Take 1 packet (4 g total) by mouth 3 (three) times daily with meals.   co-enzyme Q-10 30 MG capsule Take 30 mg by mouth daily.   FISH OIL OMEGA-3 PO Take 3,600 mg by mouth daily.   fluticasone 50 MCG/ACT nasal spray Commonly known as: FLONASE Use 2 spray(s) in each nostril once daily   ICAPS PO Take 1 tablet by mouth daily.   loratadine 10 MG tablet Commonly known as: CLARITIN Take 10 mg by mouth daily.   metoprolol tartrate 25 MG tablet Commonly known as: LOPRESSOR Take 1 tablet (25 mg total) by mouth 2 (two) times daily.   Multi-Vitamin tablet Take 1 tablet by mouth daily.   OSTEO BI-FLEX JOINT SHIELD PO Take 2 tablets by mouth daily.   PreviDent 5000 Plus 1.1 % Crea dental cream Generic drug: sodium fluoride See admin instructions.   PROBIOTIC PO Take 1 capsule by mouth daily.   rosuvastatin 20 MG tablet Commonly known as: CRESTOR Take 1 tablet (20 mg total) by mouth daily.   Synthroid 75 MCG tablet Generic drug: levothyroxine TAKE 1 TABLET BY MOUTH ONCE DAILY BEFORE BREAKFAST       Signature:  Coralyn Helling, MD [facility] Pager - [phone] 02/17/2020, 11:15 AM

## 2020-02-29 ENCOUNTER — Telehealth: Payer: Self-pay | Admitting: Family Medicine

## 2020-02-29 DIAGNOSIS — E782 Mixed hyperlipidemia: Secondary | ICD-10-CM

## 2020-02-29 DIAGNOSIS — E039 Hypothyroidism, unspecified: Secondary | ICD-10-CM

## 2020-02-29 DIAGNOSIS — Z5181 Encounter for therapeutic drug level monitoring: Secondary | ICD-10-CM

## 2020-02-29 NOTE — Telephone Encounter (Signed)
Pt coming in on 03/19/2020 for medcheck , she called today and scheduled appt for her fasting labs on 03/14/20  Her 7/14 notes show F/u 6 months Lipids, c-met, CBC, TSH  Can you put orders in for her labs on 03/14/20 

## 2020-03-01 ENCOUNTER — Other Ambulatory Visit: Payer: Self-pay | Admitting: Family Medicine

## 2020-03-01 DIAGNOSIS — Z1231 Encounter for screening mammogram for malignant neoplasm of breast: Secondary | ICD-10-CM

## 2020-03-13 ENCOUNTER — Telehealth: Payer: Self-pay | Admitting: Pulmonary Disease

## 2020-03-13 NOTE — Telephone Encounter (Signed)
Auto CPAP 02/08/20 to 03/08/20 >> used on 29 of 30 nights with average 6 hrs 54 min.  Average AHI 3.2 with mean CPAP 7 and 90 th percentile CPAP 9 cm H2O.   Please let her know CPAP report shows good control of sleep apnea with current settings.

## 2020-03-14 ENCOUNTER — Other Ambulatory Visit: Payer: Self-pay

## 2020-03-14 ENCOUNTER — Other Ambulatory Visit: Payer: Medicare Other

## 2020-03-14 DIAGNOSIS — E039 Hypothyroidism, unspecified: Secondary | ICD-10-CM | POA: Diagnosis not present

## 2020-03-14 DIAGNOSIS — Z5181 Encounter for therapeutic drug level monitoring: Secondary | ICD-10-CM

## 2020-03-14 DIAGNOSIS — E782 Mixed hyperlipidemia: Secondary | ICD-10-CM

## 2020-03-14 NOTE — Telephone Encounter (Signed)
Per DPR left detailed message of CPAP download results per Dr Craige Cotta. I left our return number for pt to call back if she has any further questions. Nothing further needed at this time.

## 2020-03-14 NOTE — Telephone Encounter (Signed)
Attempted to call pt but unable to reach. Left message for her to return call. 

## 2020-03-15 LAB — CBC WITH DIFFERENTIAL/PLATELET
Basophils Absolute: 0 10*3/uL (ref 0.0–0.2)
Basos: 1 %
EOS (ABSOLUTE): 0.1 10*3/uL (ref 0.0–0.4)
Eos: 3 %
Hematocrit: 38.3 % (ref 34.0–46.6)
Hemoglobin: 13.3 g/dL (ref 11.1–15.9)
Immature Grans (Abs): 0 10*3/uL (ref 0.0–0.1)
Immature Granulocytes: 0 %
Lymphocytes Absolute: 1.2 10*3/uL (ref 0.7–3.1)
Lymphs: 32 %
MCH: 34.3 pg — ABNORMAL HIGH (ref 26.6–33.0)
MCHC: 34.7 g/dL (ref 31.5–35.7)
MCV: 99 fL — ABNORMAL HIGH (ref 79–97)
Monocytes Absolute: 0.5 10*3/uL (ref 0.1–0.9)
Monocytes: 14 %
Neutrophils Absolute: 1.9 10*3/uL (ref 1.4–7.0)
Neutrophils: 50 %
Platelets: 202 10*3/uL (ref 150–450)
RBC: 3.88 x10E6/uL (ref 3.77–5.28)
RDW: 11.7 % (ref 11.7–15.4)
WBC: 3.8 10*3/uL (ref 3.4–10.8)

## 2020-03-15 LAB — COMPREHENSIVE METABOLIC PANEL
ALT: 21 IU/L (ref 0–32)
AST: 23 IU/L (ref 0–40)
Albumin/Globulin Ratio: 2.3 — ABNORMAL HIGH (ref 1.2–2.2)
Albumin: 4.4 g/dL (ref 3.6–4.6)
Alkaline Phosphatase: 73 IU/L (ref 44–121)
BUN/Creatinine Ratio: 16 (ref 12–28)
BUN: 15 mg/dL (ref 8–27)
Bilirubin Total: 0.3 mg/dL (ref 0.0–1.2)
CO2: 23 mmol/L (ref 20–29)
Calcium: 9.5 mg/dL (ref 8.7–10.3)
Chloride: 103 mmol/L (ref 96–106)
Creatinine, Ser: 0.92 mg/dL (ref 0.57–1.00)
GFR calc Af Amer: 66 mL/min/{1.73_m2} (ref >59)
GFR calc non Af Amer: 57 mL/min/{1.73_m2} — ABNORMAL LOW (ref >59)
Globulin, Total: 1.9 g/dL (ref 1.5–4.5)
Glucose: 98 mg/dL (ref 65–99)
Potassium: 4.2 mmol/L (ref 3.5–5.2)
Sodium: 140 mmol/L (ref 134–144)
Total Protein: 6.3 g/dL (ref 6.0–8.5)

## 2020-03-15 LAB — LIPID PANEL
Chol/HDL Ratio: 2.8 ratio (ref 0.0–4.4)
Cholesterol, Total: 144 mg/dL (ref 100–199)
HDL: 52 mg/dL (ref >39)
LDL Chol Calc (NIH): 67 mg/dL (ref 0–99)
Triglycerides: 148 mg/dL (ref 0–149)
VLDL Cholesterol Cal: 25 mg/dL (ref 5–40)

## 2020-03-15 LAB — TSH: TSH: 0.206 u[IU]/mL — ABNORMAL LOW (ref 0.450–4.500)

## 2020-03-16 ENCOUNTER — Ambulatory Visit: Payer: Medicare Other | Admitting: Cardiology

## 2020-03-18 NOTE — Progress Notes (Signed)
Start time: 10:33 End time: 11:07  Changed to telephone visit, as she was unable to access video (error messages)  Virtual Visit via Telephone Note  I connected with Abigail Day on 03/19/20 by telephone and verified that I am speaking with the correct person using two identifiers.  Location: Patient: home Provider: home office (inclement weather day)   I discussed the limitations of evaluation and management by telemedicine and the availability of in person appointments. The patient expressed understanding and agreed to proceed.  History of Present Illness:  Chief Complaint  Patient presents with   Hypothyroidism    VIRTUAL med check, no new concerns. Labs already done. Has had some heart racing, palpitations-she is unsure if it is a thyroid symptom vs Afib. Also has had some fatigue recently. Skin has been dry, hair is dry but not falling out. No constipation. Wants to know if she specifically has Graves disease?   Patient presents for 6 months med check.  She had labs done prior to visit, see below.  She had R trochanteric bursa injected in 10/2019 for treatment of bursitis. She now only has mild discomfort when it is damp. It is "a little sore". No longer hurting with walking.  Hypothyroidism: She takes her medication on an empty stomach, separate from food and other medications. She is compliant with takingSynthroid.  Her dose of Synthroid was decreased from to in 03/2019 due to TSH being suppressed (0.296), ultimately needed to go back up to in 09/2019 (TSH was 9.990). TSH was 1.080 on 10/26/19. She is noticing some palpitations, like when she was diagnosed with hyperthyroidism, vs if it is related to her atrial fibrillation. She is also more fatigued, noticing in the last 2-3 months.  She needs to rest more, rests prior to going to the gym, but still exercising 30 minutes at the gym every day. Nails are brittle.  Denies hair loss. Most recent TSH: Lab Results   Component Value Date   TSH 0.206 (L) 03/14/2020   Mixed hyperlipidemia: She is compliant with takingCrestor, and denies side effects(previously tookpravastatin which wasn't getting her to goal for TG;previously didn't tolerate lipitor) She continues on fish oil(2/d),and tries to follow lowfat, low cholesterol diet.She is also taking Coenzyme Q10 daily.She wears compression stockings and no longer has much aching in her legs (unless she forgets to wear them).   Atrial fibrillation: She remains on blood thinners without any bleeding or complications. She sees Dr. [name] every 6 months, last seen in July, scheduled for later this month. As noted above, she has had some more frequent tachycardia.  HTN: Occasionally checks BP, runs 113-128 (higher today)/60's, pulse in the 60's (not checked when is feeling fast). She didn't sleep too well last night, thinks that why BP is up (didn't wear compression stockings, had more aching in her legs). No headaches, dizziness, chest pain, shortness of breath. She needs to get up slowly in the morning, to avoid dizziness.  OSA: She is using CPAP, and doing well, under the care ofDr. Sood. Shecontinues to feelmore refreshed since being on CPAP.  Allergies--She uses claritin and flonase with good results.  Hoarseness: This remains the same, not as bad as in the past. She previously saw Dr. Suszanne Conners, diagnosed with laryngopharyngeal reflux, and PPI was increased, but she didn't see any benefit to her symptoms. She has undergone rehab through [facility] lab.  She was told it was related to her thyroid treatment (from voice specialist). She was told  to stop the omeprazole entirely (by the folks at [facility]), has been off for quite a while and denies any recurrent reflux symptoms.If she drinks a lot of water, her voice improves. She continues to do home exercises.  Osteopenia--She restarted Boniva after her bone density 04/2014.It showed osteopenia,  but elevated FRAX score (probability of a major osteoporotic fracture is 21%,hip fracture is 5.1%). At one point she stopped takingBonivaforabout 4 months(she didn't feel like it was helping, since thefollow-upDEXA looked the same). Not stopped due to side effects or problems.Sherestarted monthly Bonivain8/2018, but later reported going off again for about 6 months (prior to 09/2018 visit; had some trouble swallowing the pill). At her last visit, she reported since drinking more water, she wasn't having any further difficulty swallowing pills. 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 She restarted the [facility] after her bone density test, to complete another year, if tolerated.  She took it up until the end of June, 2021.  DEXA will be due 11/2020.  PMH, PSH, SH reviewed  Outpatient Encounter Medications as of 03/19/2020  Medication Sig Note   apixaban (ELIQUIS) 2.5 MG TABS tablet Take 1 tablet (2.5 mg total) by mouth 2 (two) times daily.    Calcium Carbonate-Vitamin D (CALCIUM 600 + D PO) Take 2 tablets by mouth daily.    co-enzyme Q-10 30 MG capsule Take 30 mg by mouth daily.    fluticasone (FLONASE) 50 MCG/ACT nasal spray Use 2 spray(s) in each nostril once daily    loratadine (CLARITIN) 10 MG tablet Take 10 mg by mouth daily.    metoprolol tartrate (LOPRESSOR) 25 MG tablet Take 1 tablet (25 mg total) by mouth 2 (two) times daily.    Misc Natural Products (OSTEO BI-FLEX JOINT SHIELD PO) Take 2 tablets by mouth daily.    Multiple Vitamin (MULTI-VITAMIN) tablet Take 1 tablet by mouth daily.    Multiple Vitamins-Minerals (ICAPS PO) Take 1 tablet by mouth daily.    Omega-3 Fatty Acids (FISH OIL OMEGA-3 PO) Take 3,600 mg by mouth daily.  03/07/2019: Takes 2/day (got higher dose per pt, so cut back from 3)   PREVIDENT 5000 PLUS 1.1 % CREA dental cream See admin instructions.    Probiotic Product (PROBIOTIC PO) Take 1 capsule by mouth daily.    rosuvastatin (CRESTOR) 20 MG tablet Take  1 tablet (20 mg total) by mouth daily.    SYNTHROID 75 MCG tablet TAKE 1 TABLET BY MOUTH ONCE DAILY BEFORE BREAKFAST    cholestyramine (QUESTRAN) 4 g packet Take 1 packet (4 g total) by mouth 3 (three) times daily with meals. (Patient not taking: Reported on 03/19/2020)    No facility-administered encounter medications on file as of 03/19/2020.   No Known Allergies  ROS: No fever, chills, URI symptoms, cough, shortness of breath. Increased fatigue, tachycardia and brittle nails per HPI. Hoarseness is stable/improved. No leg pain unless she doesn't wear the compression stockings. Denies any heartburn, dysphagia.  Bowels are normal (occasional diarrhea depending on what she eats).  No dysuria or hematuria. Occasional trouble emptying.    Observations/Objective:  BP (!) 142/64    Pulse 65    Ht 4\' 9"  (1.448 m)    Wt 127 lb (57.6 kg)    BMI 27.48 kg/m   Patient is alert, oriented, in good spirits, in no distress. Exam is limited due to virtual nature of the visit.  Lab Results  Component Value Date   TSH 0.206 (L) 03/14/2020   Lab Results  Component  Value Date   CHOL 144 03/14/2020   HDL 52 03/14/2020   LDLCALC 67 03/14/2020   TRIG 148 03/14/2020   CHOLHDL 2.8 03/14/2020     Chemistry      Component Value Date/Time   NA 140 03/14/2020 0901   K 4.2 03/14/2020 0901   CL 103 03/14/2020 0901   CO2 23 03/14/2020 0901   BUN 15 03/14/2020 0901   CREATININE 0.92 03/14/2020 0901   CREATININE 0.83 04/21/2016 0852      Component Value Date/Time   CALCIUM 9.5 03/14/2020 0901   ALKPHOS 73 03/14/2020 0901   AST 23 03/14/2020 0901   ALT 21 03/14/2020 0901   BILITOT 0.3 03/14/2020 0901     Fasting glucose 98  Lab Results  Component Value Date   WBC 3.8 03/14/2020   HGB 13.3 03/14/2020   HCT 38.3 03/14/2020   MCV 99 (H) 03/14/2020   PLT 202 03/14/2020     Assessment and Plan:  Postablative hypothyroidism - over-replaced on ; prev under-replaced on 50mg . Change to  Mon through Sat, Mon on Sunday. Recheck 6-8 wks - Plan: SYNTHROID 50 MCG tablet  Obstructive sleep apnea - compliant with CPAP  Paroxysmal atrial fibrillation (HCC) - will see cardiologist soon. Cont anticoag. Overreplaced thyroid may be contributing to increased symptoms  Osteopenia of neck of left femur - completed course of meds; DEXA due 11/2020. Cont Ca, D, weight-bearing exercise - Plan: DG Bone Density  LPRD (laryngopharyngeal reflux disease) - unclear how much this is contributing; had no benefit from PPI.  Cont home exercise from voice specialist  Chronic hoarseness - stable/improved. Cont home exercises  Mixed hyperlipidemia - Cont rosuvastatin, low cholesterol, lowfat diet. - Plan: rosuvastatin (CRESTOR) 20 MG tablet   Change to 12/2020 daily except on Sunday Stop the 75 New Rx Synthroid (brand)--[employer] 50mg   F/u lab visit in 6-8 wks, AWV/med check 08/2020. Print/mail AVS.    Follow Up Instructions:    I discussed the assessment and treatment plan with the patient. The patient was provided an opportunity to ask questions and all were answered. The patient agreed with the plan and demonstrated an understanding of the instructions.   The patient was advised to call back or seek an in-person evaluation if the symptoms worsen or if the condition fails to improve as anticipated.  I spent 38 minutes dedicated to the care of this patient, including pre-visit review of records, face to face time, post-visit ordering of testing and documentation.    , MD

## 2020-03-19 ENCOUNTER — Telehealth (INDEPENDENT_AMBULATORY_CARE_PROVIDER_SITE_OTHER): Payer: Medicare Other | Admitting: Family Medicine

## 2020-03-19 ENCOUNTER — Encounter: Payer: Self-pay | Admitting: Family Medicine

## 2020-03-19 ENCOUNTER — Other Ambulatory Visit: Payer: Self-pay

## 2020-03-19 VITALS — BP 142/64 | HR 65 | Ht <= 58 in | Wt 127.0 lb

## 2020-03-19 DIAGNOSIS — K219 Gastro-esophageal reflux disease without esophagitis: Secondary | ICD-10-CM

## 2020-03-19 DIAGNOSIS — E782 Mixed hyperlipidemia: Secondary | ICD-10-CM | POA: Diagnosis not present

## 2020-03-19 DIAGNOSIS — M85852 Other specified disorders of bone density and structure, left thigh: Secondary | ICD-10-CM | POA: Diagnosis not present

## 2020-03-19 DIAGNOSIS — G4733 Obstructive sleep apnea (adult) (pediatric): Secondary | ICD-10-CM | POA: Diagnosis not present

## 2020-03-19 DIAGNOSIS — R490 Dysphonia: Secondary | ICD-10-CM

## 2020-03-19 DIAGNOSIS — E890 Postprocedural hypothyroidism: Secondary | ICD-10-CM

## 2020-03-19 DIAGNOSIS — I480 Paroxysmal atrial fibrillation: Secondary | ICD-10-CM | POA: Diagnosis not present

## 2020-03-19 MED ORDER — ROSUVASTATIN CALCIUM 20 MG PO TABS
20.0000 mg | ORAL_TABLET | Freq: Every day | ORAL | 1 refills | Status: DC
Start: 2020-03-19 — End: 2020-09-19

## 2020-03-20 MED ORDER — SYNTHROID 50 MCG PO TABS: ORAL_TABLET | ORAL | 1 refills | Status: DC

## 2020-03-20 NOTE — Patient Instructions (Addendum)
Stop taking Synthroid . We are changing your dose back to the 50 mcg tablet.  Take it once daily on Monday through Saturday, and double up (take 2 of the 50 mcg tablets) on Sundays.  Someone will contact you to schedule a follow-up lab visit (nonfasting--you can eat) in 6-8 weeks, and also to schedule your wellness visit in July.  Your cholesterol is well controlled. Your home blood pressure values seem very good (other than today, after not sleeping well last night).

## 2020-03-25 NOTE — Progress Notes (Signed)
Abigail Day Date of Birth: [birth year] Medical Record #[id number]  History of Present Illness: Abigail Day is seen for follow up of Afib. She has a history of paroxysmal atrial fib - on Eliquis. EF is normal per echo back in December of 2013. Normal Myoview in June 2015. Other issues include HLD, hypothyroidism with prior ablation for hyperthyroidism, and diverticulosis. She is  on CPAP therapy for sleep apnea-followed by pulmonary. In 2019 she had some scapular pain and dyspnea. We repeated an Echo and Myoview study which were unremarkable.  On follow up today she notes her thyroid is out of whack. Labs indicate high thyroid levels. She did feel jittery and notes some more heart racing. Thyroid dose has been reduced.  She is on CPAP and doing well. Does get SOB with walking or going up stairs.    Current Outpatient Medications on File Prior to Visit  Medication Sig Dispense Refill  . apixaban (ELIQUIS) 2.5 MG TABS tablet Take 1 tablet (2.5 mg total) by mouth 2 (two) times daily. 90 tablet 3  . Calcium Carbonate-Vitamin D (CALCIUM 600 + D PO) Take 2 tablets by mouth daily.    . cholestyramine (QUESTRAN) 4 g packet Take 1 packet (4 g total) by mouth 3 (three) times daily with meals. (Patient taking differently: Take 4 g by mouth daily as needed.) 60 each 2  . co-enzyme Q-10 30 MG capsule Take 30 mg by mouth daily.    . fluticasone (FLONASE) 50 MCG/ACT nasal spray Use 2 spray(s) in each nostril once daily 48 g 0  . loratadine (CLARITIN) 10 MG tablet Take 10 mg by mouth daily.    . metoprolol tartrate (LOPRESSOR) 25 MG tablet Take 1 tablet (25 mg total) by mouth 2 (two) times daily. 180 tablet 3  . Misc Natural Products (OSTEO BI-FLEX JOINT SHIELD PO) Take 2 tablets by mouth daily.    . Multiple Vitamin (MULTI-VITAMIN) tablet Take 1 tablet by mouth daily.    . Multiple Vitamins-Minerals (ICAPS PO) Take 1 tablet by mouth daily.    . Omega-3 Fatty Acids (FISH OIL OMEGA-3 PO) Take 3,600 mg by mouth  daily.     Marland Kitchen PREVIDENT 5000 PLUS 1.1 % CREA dental cream See admin instructions.  3  . Probiotic Product (PROBIOTIC PO) Take 1 capsule by mouth daily.    . rosuvastatin (CRESTOR) 20 MG tablet Take 1 tablet (20 mg total) by mouth daily. 90 tablet 1  . SYNTHROID 50 MCG tablet Take 1 tablet daily before breakfast 6 days/week; take 2 tablets on Sundays 100 tablet 1   No current facility-administered medications on file prior to visit.    No Known Allergies  Past Medical History:  Diagnosis Date  . Atrial fibrillation (HCC)   . Diverticulosis   . Dyspnea on exertion 02/18/2012  . Eye exam abnormal 05/08/15   Dr.Groat-glaucoma suspect  . Glaucoma suspect   . Hemorrhoids    internal and external  . Hemorrhoids 09/25/10   internal and external  . Hyperplastic colon polyp 09/25/10   Dr. Ewing Schlein  . Osteopenia   . Palpitations 02/18/2012  . Paroxysmal atrial fibrillation (HCC)   . Pure hypercholesterolemia   . Sleep apnea   . Unspecified hypothyroidism    s/p ablation for hyperthyroidism  . Urge urinary incontinence     Past Surgical History:  Procedure Laterality Date  . ABDOMINAL HYSTERECTOMY  age 74   with BSO and bladder tack  . COLONOSCOPY  11/06, 09/25/10, 12/2015  hyperplastic polyp 2012;   . ESOPHAGOGASTRODUODENOSCOPY  09/25/10   tortuous esophagus, intact Nissen fundoplication, dilated the spasm at esophageal GE junction  . EYE SURGERY     bilateral cataract surgery  . LAPAROSCOPIC NISSEN FUNDOPLICATION  8/07   Dr. Daphine Deutscher  . LARYNGOSCOPY  11/24/2017   dx.GERD,start omeprazole    Social History   Tobacco Use  Smoking Status Never Smoker  Smokeless Tobacco Never Used    Social History   Substance and Sexual Activity  Alcohol Use Yes  . Alcohol/week: 0.0 standard drinks   Comment: once a month or less    Family History  Problem Relation Age of Onset  . Heart disease Mother   . Heart disease Father   . Diabetes Father   . Marfan syndrome Sister   . Heart  disease Sister        valve replacement  . Heart disease Brother   . Diabetes Brother   . Heart disease Brother   . Diabetes Brother   . Heart disease Brother   . Heart disease Brother   . Heart disease Brother        CABG  . Heart disease Brother        CABG  . Heart disease Sister        pacemaker  . Aortic aneurysm Sister   . Peripheral Artery Disease Sister        amputation of toes  . Breast cancer Sister 38  . Heart disease Sister        CABG at 34  . Colon cancer Sister 49  . Heart disease Sister        CABG  . Heart disease Sister   . Heart disease Brother        stent  . Heart disease Sister   . Hyperlipidemia Sister   . Stroke Sister        in 1948, mild    Review of Systems: The review of systems is per the HPI.  All other systems were reviewed and are negative.  Physical Exam: BP 110/62   Pulse 62   Ht 4\' 11"  (1.499 m)   Wt 129 lb 12.8 oz (58.9 kg)   SpO2 97%   BMI 26.22 kg/m  GENERAL:  [name] in NAD HEENT:  PERRL, EOMI, sclera are clear. Oropharynx is clear. NECK:  No jugular venous distention, carotid upstroke brisk and symmetric, no bruits, no thyromegaly or adenopathy LUNGS:  Clear to auscultation bilaterally CHEST:  Unremarkable HEART:  RRR,  PMI not displaced or sustained,S1 and S2 within normal limits, no S3, no S4: no clicks, no rubs, no murmurs ABD:  Soft, nontender. BS +, no masses or bruits. No hepatomegaly, no splenomegaly EXT:  2 + pulses throughout, no edema, no cyanosis no clubbing SKIN:  Warm and dry.  No rashes NEURO:  Alert and oriented x 3. Cranial nerves II through XII intact. PSYCH:  Cognitively intact    LABORATORY DATA:     Lab Results  Component Value Date   WBC 3.8 03/14/2020   HGB 13.3 03/14/2020   HCT 38.3 03/14/2020   PLT 202 03/14/2020   GLUCOSE 98 03/14/2020   CHOL 144 03/14/2020   TRIG 148 03/14/2020   HDL 52 03/14/2020   LDLCALC 67 03/14/2020   ALT 21 03/14/2020   AST 23  03/14/2020   NA 140 03/14/2020   K 4.2 03/14/2020   CL 103 03/14/2020   CREATININE 0.92 03/14/2020  BUN 15 03/14/2020   CO2 23 03/14/2020   TSH 0.206 (L) 03/14/2020   HGBA1C 5.4 09/24/2015    Echo 09/23/17: Study Conclusions  - Left ventricle: The cavity size was normal. Wall thickness was   normal. Systolic function was normal. The estimated ejection   fraction was in the range of 60% to 65%. Doppler parameters are   consistent with abnormal left ventricular relaxation (grade 1   diastolic dysfunction). The Ee&' ratio is between 8-15, suggesting   indeterminate LV filling pressure. - Left atrium: The atrium was normal in size. - Tricuspid valve: There was mild regurgitation. - Pulmonary arteries: PA peak pressure: 27 mm Hg (S). - Inferior vena cava: The vessel was normal in size. The   respirophasic diameter changes were in the normal range (>= 50%),   consistent with normal central venous pressure.  Impressions:  - Compared to a prior study in 2013, the LVEF is unchanged. Left   and right atria measure normal in size.  Myoview 09/29/17: Study Highlights     Nuclear stress EF: 55%.  No T wave inversion was noted during stress.  There was no ST segment deviation noted during stress.  The study is normal.  This is a low risk study.   Normal perfusion. LVEF 55% with normal wall motion. This is a low risk study. No change compared to prior study.     Assessment / Plan:  1. Obstructive sleep apnea. On CPAP therapy  2. PAF - treated with rate control with metoprolol and Eliquis.  Infrequent episodes. Now in NSR. Continue current therapy  3. Hypercholesterolemia. Most recent lipid panel showed excellent LDL of 67 on Crestor.  4. Hypothyroidism. Recent reduction in thyroid dose. Followed closely by Dr Lynelle Doctor.    Follow up in 6 months.

## 2020-03-28 ENCOUNTER — Other Ambulatory Visit: Payer: Self-pay

## 2020-03-28 ENCOUNTER — Ambulatory Visit (INDEPENDENT_AMBULATORY_CARE_PROVIDER_SITE_OTHER): Payer: Medicare Other | Admitting: Cardiology

## 2020-03-28 ENCOUNTER — Encounter: Payer: Self-pay | Admitting: Cardiology

## 2020-03-28 VITALS — BP 110/62 | HR 62 | Ht 59.0 in | Wt 129.8 lb

## 2020-03-28 DIAGNOSIS — R0600 Dyspnea, unspecified: Secondary | ICD-10-CM | POA: Diagnosis not present

## 2020-03-28 DIAGNOSIS — R0609 Other forms of dyspnea: Secondary | ICD-10-CM

## 2020-03-28 DIAGNOSIS — I480 Paroxysmal atrial fibrillation: Secondary | ICD-10-CM

## 2020-03-28 DIAGNOSIS — G4733 Obstructive sleep apnea (adult) (pediatric): Secondary | ICD-10-CM | POA: Diagnosis not present

## 2020-03-28 MED ORDER — METOPROLOL TARTRATE 25 MG PO TABS: 25.0000 mg | ORAL_TABLET | Freq: Two times a day (BID) | ORAL | 3 refills | Status: DC

## 2020-03-30 ENCOUNTER — Other Ambulatory Visit: Payer: Self-pay | Admitting: Family Medicine

## 2020-03-30 DIAGNOSIS — M85852 Other specified disorders of bone density and structure, left thigh: Secondary | ICD-10-CM

## 2020-04-10 ENCOUNTER — Ambulatory Visit
Admission: RE | Admit: 2020-04-10 | Discharge: 2020-04-10 | Disposition: A | Discharge status: Home / Self Care | Payer: Medicare Other | Source: Ambulatory Visit | Attending: Family Medicine | Admitting: Family Medicine

## 2020-04-10 ENCOUNTER — Ambulatory Visit: Payer: Medicare Other

## 2020-04-10 ENCOUNTER — Other Ambulatory Visit: Payer: Self-pay

## 2020-04-10 DIAGNOSIS — Z1231 Encounter for screening mammogram for malignant neoplasm of breast: Secondary | ICD-10-CM

## 2020-04-12 ENCOUNTER — Telehealth: Payer: Self-pay | Admitting: *Deleted

## 2020-04-12 NOTE — Telephone Encounter (Signed)
Was looking at the lab schedule for Monday, she was supposed to be 6-8 weeks lab visit from 03/19/20-is this okay to come earlier?

## 2020-04-13 NOTE — Telephone Encounter (Signed)
You likely did this, but wanted to confirm--too soon.  R/s out for 6 weeks from dose change as requested

## 2020-04-16 ENCOUNTER — Other Ambulatory Visit: Payer: Medicare Other

## 2020-04-16 NOTE — Telephone Encounter (Signed)
Rescheduled for 3/7

## 2020-05-07 ENCOUNTER — Other Ambulatory Visit: Payer: Medicare Other

## 2020-05-07 ENCOUNTER — Other Ambulatory Visit: Payer: Self-pay

## 2020-05-07 DIAGNOSIS — E890 Postprocedural hypothyroidism: Secondary | ICD-10-CM | POA: Diagnosis not present

## 2020-05-08 LAB — TSH: TSH: 2.53 u[IU]/mL (ref 0.450–4.500)

## 2020-05-21 ENCOUNTER — Other Ambulatory Visit: Payer: Self-pay | Admitting: Family Medicine

## 2020-05-21 DIAGNOSIS — J309 Allergic rhinitis, unspecified: Secondary | ICD-10-CM

## 2020-05-23 DIAGNOSIS — H401111 Primary open-angle glaucoma, right eye, mild stage: Secondary | ICD-10-CM | POA: Diagnosis not present

## 2020-05-23 DIAGNOSIS — H02831 Dermatochalasis of right upper eyelid: Secondary | ICD-10-CM | POA: Diagnosis not present

## 2020-05-23 DIAGNOSIS — H43811 Vitreous degeneration, right eye: Secondary | ICD-10-CM | POA: Diagnosis not present

## 2020-05-23 DIAGNOSIS — H31091 Other chorioretinal scars, right eye: Secondary | ICD-10-CM | POA: Diagnosis not present

## 2020-05-23 DIAGNOSIS — Z961 Presence of intraocular lens: Secondary | ICD-10-CM | POA: Diagnosis not present

## 2020-05-23 DIAGNOSIS — H02834 Dermatochalasis of left upper eyelid: Secondary | ICD-10-CM | POA: Diagnosis not present

## 2020-05-23 DIAGNOSIS — H3561 Retinal hemorrhage, right eye: Secondary | ICD-10-CM | POA: Diagnosis not present

## 2020-05-23 DIAGNOSIS — H40012 Open angle with borderline findings, low risk, left eye: Secondary | ICD-10-CM | POA: Diagnosis not present

## 2020-05-23 DIAGNOSIS — H04123 Dry eye syndrome of bilateral lacrimal glands: Secondary | ICD-10-CM | POA: Diagnosis not present

## 2020-05-23 DIAGNOSIS — H35371 Puckering of macula, right eye: Secondary | ICD-10-CM | POA: Diagnosis not present

## 2020-05-23 DIAGNOSIS — H353131 Nonexudative age-related macular degeneration, bilateral, early dry stage: Secondary | ICD-10-CM | POA: Diagnosis not present

## 2020-07-07 ENCOUNTER — Other Ambulatory Visit: Payer: Self-pay | Admitting: Cardiology

## 2020-07-09 NOTE — Telephone Encounter (Signed)
60f, 58.9kg, scr 0.92 03/14/20, lovw/jordan 03/28/20

## 2020-07-16 ENCOUNTER — Ambulatory Visit (INDEPENDENT_AMBULATORY_CARE_PROVIDER_SITE_OTHER): Payer: Medicare Other

## 2020-07-16 DIAGNOSIS — Z23 Encounter for immunization: Secondary | ICD-10-CM | POA: Diagnosis not present

## 2020-09-06 ENCOUNTER — Telehealth: Payer: Self-pay | Admitting: Family Medicine

## 2020-09-06 NOTE — Telephone Encounter (Signed)
Pt has her Wellness visit on 7/20 @ 1:45, does she needs to have any labs prior to her visit and if so does she need to be fasting  Please call

## 2020-09-06 NOTE — Telephone Encounter (Signed)
Informed pt per Dr. Lynelle Doctor, we can do her labs at her appt on 09/19/20

## 2020-09-06 NOTE — Telephone Encounter (Signed)
She only needs CBC and TSH.  Doesn't need to be fasting, can do at her visit (doesn't need to come prior).

## 2020-09-11 DIAGNOSIS — M546 Pain in thoracic spine: Secondary | ICD-10-CM | POA: Diagnosis not present

## 2020-09-11 DIAGNOSIS — M7061 Trochanteric bursitis, right hip: Secondary | ICD-10-CM | POA: Diagnosis not present

## 2020-09-18 NOTE — Patient Instructions (Addendum)
  HEALTH MAINTENANCE RECOMMENDATIONS:  It is recommended that you get at least 30 minutes of aerobic exercise at least 5 days/week (for weight loss, you may need as much as 60-90 minutes). This can be any activity that gets your heart rate up. This can be divided in 10-15 minute intervals if needed, but try and build up your endurance at least once a week.  Weight bearing exercise is also recommended twice weekly.  Eat a healthy diet with lots of vegetables, fruits and fiber.  "Colorful" foods have a lot of vitamins (ie green vegetables, tomatoes, red peppers, etc).  Limit sweet tea, regular sodas and alcoholic beverages, all of which has a lot of calories and sugar.  Up to 1 alcoholic drink daily may be beneficial for women (unless trying to lose weight, watch sugars).  Drink a lot of water.  Calcium recommendations are 1200-1500 mg daily (1500 mg for postmenopausal women or women without ovaries), and vitamin D 1000 IU daily.  This should be obtained from diet and/or supplements (vitamins), and calcium should not be taken all at once, but in divided doses.  Monthly self breast exams and yearly mammograms for women over the age of 46 is recommended.  Sunscreen of at least SPF 30 should be used on all sun-exposed parts of the skin when outside between the hours of 10 am and 4 pm (not just when at beach or pool, but even with exercise, golf, tennis, and yard work!)  Use a sunscreen that says "broad spectrum" so it covers both UVA and UVB rays, and make sure to reapply every 1-2 hours.  Remember to change the batteries in your smoke detectors when changing your clock times in the spring and fall. Carbon monoxide detectors are recommended for your home.  Use your seat belt every time you are in a car, and please drive safely and not be distracted with cell phones and texting while driving.   Abigail Day , Thank you for taking time to come for your Medicare Wellness Visit. I appreciate your ongoing  commitment to your health goals. Please review the following plan we discussed and let me know if I can assist you in the future.    This is a list of the screening recommended for you and due dates:  Health Maintenance  Topic Date Due   Flu Shot  10/01/2020   COVID-19 Vaccine (4 - Booster for Moderna series) 11/16/2020   Tetanus Vaccine  01/07/2022   DEXA scan (bone density measurement)  Completed   Pneumonia vaccines  Completed   Zoster (Shingles) Vaccine  Completed   HPV Vaccine  Aged Out   Continue yearly mammograms (due again 04/2021). Get your bone density test as scheduled for 11/2020.  COVID boosters are all up to date--we entered the last of your boosters today.

## 2020-09-18 NOTE — Progress Notes (Signed)
Chief Complaint  Patient presents with   Medicare Wellness    Nonfasting AWV visit. Does complain of some issues emptying her bladder.(Could not give UA-will give on way out if needed) She also wakes about 2-3 times per night. She is also having low back pain. Saw Dr. Rolena Infante at [facility] for lbp and right shoulder pain-she says he told her she has osteoporosis and sent her for PT. Also referred her to Dr. Nelva Bush.     Abigail Day is a 87 y.o. female who presents for Medicare annual wellness visit and follow-up on chronic medical conditions.   She has the following concerns:  She notices a knot on her stomach, and still has a lot of gas.  She gets occasional pain on L side with certain foods (corn, nuts).  Currently denies pain, no blood in stool.  The knot is a little tender, recently noticed.  After exam, she recalled bumping up against the washing machine on that side recently.  She has been having LBP x 2-3 years.  It started getting worse, radiates up under the shoulder blade on the R side.  Saw Dr. Rolena Infante last week for this, and she was referred for PT, and to Dr. Nelva Bush. Had PT today. Appt with Dr. Nelva Bush is 8/30. She also mentioned her hip bursitis last week to ortho, an got another cortisone injection (last injection was here 10/2019).   Hypothyroidism:  She takes her medication on an empty stomach, separate from food and other medications.  She is compliant with taking Synthroid.  In 03/2020 she was taking 14mg, and TSH was suppressed at 0.206.  She had been under-replaced on 540m dose prior to that. In January 2022 her dose was changed to 5074mdaily on Monday through Saturday, and 100m45mn Sundays.  Last TSH was normal on this regimen. Due for recheck today. Lab Results  Component Value Date   TSH 2.530 05/07/2020   Denies changes to hair/skin/energy/bowels/weight/moods. Possibly a little decrease in energy, short naps help.  Occasionally feels jittery.  Mixed hyperlipidemia:  She is  compliant with taking Crestor, and denies side effects (previously took pravastatin which wasn't getting her to goal for TG; previously didn't tolerate lipitor) She continues on fish oil (2/d),  and tries to follow lowfat, low cholesterol diet. She is also taking Coenzyme Q10 daily.  She wears compression stockings and no longer has much aching in her legs..  Lipids were at goal on last check: Lab Results  Component Value Date   CHOL 144 03/14/2020   HDL 52 03/14/2020   LDLCALC 67 03/14/2020   TRIG 148 03/14/2020   CHOLHDL 2.8 03/14/2020    Atrial fibrillation:  She remains on blood thinners without any bleeding or complications, and is on BB for rate control.  She sees Dr. JordMartiniquery 6 months, last seen in January, scheduled for next week. She denies any palpitations or problems.  HTN:  Occasionally checks BP, runs  120's/60's, pulse 70's per pt's recall. No headaches, dizziness, chest pain, shortness of breath.    OSA:  She is using CPAP, and doing well, under the care of Dr. SoodHalford Chessmane continues to feel more refreshed since being on CPAP. She reports in the last month, it hasn't stayed on her head--stretches, and has air leaks. She adjusts it and gets back to sleep.  Still using CPAP every night.   Allergies--She uses claritin and flonase with good results.    Hoarseness:  This is stable. She previously saw  Dr. Benjamine Mola, diagnosed with laryngopharyngeal reflux, and PPI was increased, but she didn't see any benefit to her symptoms. She has undergone rehab through [facility] lab.  She was told it was related to her thyroid treatment (from voice specialist), and was advised to stop the omeprazole entirely.  Since being off PPI, denies any recurrent reflux symptoms.  If she drinks a lot of water, her voice improves. She continues to do home exercises.   Osteopenia--She restarted Boniva after her bone density 04/2014. It showed osteopenia, but elevated FRAX score (probability of a major  osteoporotic fracture is 21%, hip fracture is 5.1%). At one point she stopped taking Boniva for about 4 months (no side effects--she didn't feel like it was helping, since the follow-up DEXA looked the same). She restarted monthly Boniva in 10/2016, but later reported going off again for about 6 months (prior to 09/2018 visit; had some trouble swallowing the pill). She was able to restart it, drinking more water along with the medication, after her last bone density test, and took it through the end of 08/2019. DEXA is scheduled for 11/2020. She takes Calcium and D, uses weights at the gym 2x/week.   Immunization History  Administered Date(s) Administered   Fluad Quad(high Dose 65+) 12/29/2018, 11/24/2019   Influenza Split 01/08/2011, 01/08/2012   Influenza, High Dose Seasonal PF 12/08/2012, 12/19/2013, 12/21/2014, 12/13/2015, 10/23/2016, 12/14/2017   Moderna SARS-COV2 Booster Vaccination 07/16/2020   Moderna Sars-Covid-2 Vaccination 03/07/2019, 04/04/2019, 01/10/2020   Pneumococcal Conjugate-13 03/27/2014   Pneumococcal Polysaccharide-23 01/31/2002, 01/08/2012   Td 10/01/2004   Tdap 01/08/2012   Zoster Recombinat (Shingrix) 07/07/2017, 10/18/2017   Zoster, Live 06/01/2008   Last Pap smear: n/a   Last mammogram: 04/2020 Last colonoscopy: 12/2015, negative biopsy.  No f/u needed Last DEXA:  11/2018 T-1.5 at L fem neck. Ophtho: yearly Dentist: every 9 months   Exercise: walking 30 minutes daily. Goes to weight room (bicycle and elliptical, and some weights) x 45 minutes 2-3x/week (more when too hot to walk).   Other doctors caring for patient include: Cardiology: Dr. Martinique  Dermatologist: Dr. Allyson Sabal (retired), hasn't been recently Ophtho: Dr. Katy Fitch  Dentist: Dr. Marshia Ly Pulmonary (for OSA): Dr. Halford Chessman GI: Dr. Watt Climes ENT: Dr. Benjamine Mola Voice (speech pathologist) Madie Reno at [facility] Ortho: Dr. Rolena Infante Pain: Dr. Nelva Bush (not yet seen)   Depression screen: negative Fall Screen:  negative Functional Status Screen: somedays has trouble with names (unchanged); bowels improved with probiotic (prev leakage of stool) Mini-Cog screen: 3/3 recall.  Incorrect time on clock (only 1 hand pointing to 2 for 2 o'clock, then added a second hand pointing to the 2 when asked about the other hand). Spacing is more spread out at first (1-5) and clumped some at end (6-12). Last year--Spacing of numbers on clock were a little off (spread out at first, and cramped (12 and 5 were top and bottom).  See full screens in epic   End of Life Discussion:  Patient has a living will and medical power of attorney   PMH, PSH, [place] and FH reviewed and updated  Outpatient Encounter Medications as of 09/19/2020  Medication Sig Note   Calcium Carbonate-Vitamin D (CALCIUM 600 + D PO) Take 2 tablets by mouth daily.    co-enzyme Q-10 30 MG capsule Take 30 mg by mouth daily.    ELIQUIS 2.5 MG TABS tablet Take 1 tablet by mouth twice daily    loratadine (CLARITIN) 10 MG tablet Take 10 mg by mouth daily.  metoprolol tartrate (LOPRESSOR) 25 MG tablet Take 1 tablet (25 mg total) by mouth 2 (two) times daily.    Misc Natural Products (OSTEO BI-FLEX JOINT SHIELD PO) Take 2 tablets by mouth daily.    Multiple Vitamin (MULTI-VITAMIN) tablet Take 1 tablet by mouth daily.    Multiple Vitamins-Minerals (ICAPS PO) Take 1 tablet by mouth daily.    Omega-3 Fatty Acids (FISH OIL OMEGA-3 PO) Take 3,600 mg by mouth daily.  03/07/2019: Takes 2/day (got higher dose per pt, so cut back from 3)   PREVIDENT 5000 PLUS 1.1 % CREA dental cream See admin instructions.    Probiotic Product (PROBIOTIC PO) Take 1 capsule by mouth daily.    rosuvastatin (CRESTOR) 20 MG tablet Take 1 tablet (20 mg total) by mouth daily.    SYNTHROID 50 MCG tablet Take 1 tablet daily before breakfast 6 days/week; take 2 tablets on Sundays    cholestyramine (QUESTRAN) 4 g packet Take 1 packet (4 g total) by mouth 3 (three) times daily with meals. (Patient  not taking: Reported on 09/19/2020) 09/19/2020: Rarely needs, probiotic helps   fluticasone (FLONASE) 50 MCG/ACT nasal spray Use 2 spray(s) in each nostril once daily (Patient not taking: Reported on 09/19/2020)    [DISCONTINUED] fluocinonide gel (LIDEX) 0.05 % Apply topically 4 (four) times daily as needed.    No facility-administered encounter medications on file as of 09/19/2020.   No Known Allergies   ROS: The patient denies anorexia, fever, headaches, vision changes, decreased hearing, ear pain, sore throat, breast concerns, chest pain, syncope; denies cough, swelling, nausea, vomiting, constipation, abdominal pain, melena, hematochezia, indigestion/heartburn, dysphagia, vaginal bleeding, discharge, odor or itch, genital lesions, numbness, tingling, weakness, tremor, suspicious skin lesions, depression, anxiety, abnormal bleeding/bruising, or enlarged lymph nodes.   Some trouble remembering names, no changes. Hoarseness per HPI, chronic/unchanged Numbness in feet at night.  Using topical creams help. Denies any numbness with walking (prev reported). Doesn't always feel like she empties her bladder well.  Some leakage of urine, worse at night.  Denies dysuria, odor.   PHYSICAL EXAM:  BP 112/68   Pulse 64   Ht _0  (1.473 m)   Wt 127 lb 12.8 oz (58 kg)   BMI 26.71 kg/m   Wt Readings from Last 3 Encounters:  09/19/20 127 lb 12.8 oz (58 kg)  03/28/20 129 lb 12.8 oz (58.9 kg)  03/19/20 127 lb (57.6 kg)    General Appearance:    Alert, cooperative, no distress, appears stated age.  Voice is hoarse, unchanged from prior visits  Head:    Normocephalic, without obvious abnormality, atraumatic     Eyes:    PERRL, conjunctiva/corneas clear, EOM's intact, fundi benign     Ears:    Normal TM's and external ear canals     Nose:    Not examined (wearing mask for COVID-19 pandemic)  Throat:    Not examined (wearing mask for COVID-19 pandemic)    Neck:    Supple, no lymphadenopathy; thyroid:  no enlargement/ tenderness/nodules; no carotid bruit or JVD     Back:    Spine nontender, no CVA tenderness. +kyphosis. Mildly tender at lumbar paraspinous muscles on the left. Has large patch in place at R scapular area, nontender currently  Lungs:    Clear to auscultation bilaterally without wheezes, rales or ronchi; respirations unlabored     Chest Wall:    No tenderness or deformity     Heart:    Regular rate and rhythm, S1 and S2  normal, no murmur, rub or gallop.   Breast Exam:    No tenderness, masses, or nipple discharge or inversion. No axillary lymphadenopathy     Abdomen:    Soft, nondistended, nontender, normoactive bowel sounds, no hepatosplenomegaly or masses. Slightly tener at her area of concern, which is the inferior-most floating rib on the left. Symmetric with that on R, which was nontender.    Genitalia:    Normal external genitalia without lesions. BUS and vagina normal, atrophic changes noted; No abnormal vaginal discharge. Uterus and adnexa surgically absent, no masses. Pap not performed     Rectal:    Normal tone, no masses or tenderness; guaiac negative stool     Extremities:    No clubbing, cyanosis or edema. 2+ pulses. Nontender at R trochanteric bursa.  Small ecchymosis noted from recent injection  Pulses:    2+ and symmetric all extremities     Skin:    Skin color, texture, turgor normal, no lesions or rash  Lymph nodes:    Cervical, supraclavicular, and axillary nodes normal     Neurologic:    CNII-XII intact, normal strength, sensation and gait; reflexes 2+ and symmetric throughout.  Alert and oriented.                      Psych:    Normal mood, affect, hygiene and grooming   ASSESSMENT/PLAN:  Medicare annual wellness visit, subsequent  Postablative hypothyroidism - no thyroid-related symptoms, due for recheck.  Adjust dose per TSH. Has been over-replaced on 75, under- on 50 mcg in the past - Plan: TSH  Obstructive sleep apnea  Paroxysmal atrial fibrillation  (HCC) - NSR today, on anticoag and BB for rate control  Osteopenia of neck of left femur - completed course of Boniva, scheduled for f/u DEXA in September. Cont Ca, D, weight-bearing exercise  Long term (current) use of anticoagulants - Plan: CBC with Differential/Platelet  Mixed hyperlipidemia - Cont rosuvastatin, low cholesterol, lowfat diet. - Plan: rosuvastatin (CRESTOR) 20 MG tablet  Left sided abdominal pain is likely a small contusion (where she recalled later bumping into washing machine) at the floating rib on the left . Reassured.  Patient never supplied urine sample for evaluation of her urinary complaints.  Discussed monthly self breast exams and yearly mammograms; at least 30 minutes of aerobic activity at least 5 days/week, weight-bearing exercise at least 2x/week; proper sunscreen use reviewed; healthy diet, including goals of calcium and vitamin D intake and alcohol recommendations (less than or equal to 1 drink/day) reviewed; regular seatbelt use; changing batteries in smoke detectors. Immunization recommendations discussed--UTD. Continue yearly high dose flu shots. Colonoscopy recommendations reviewed, UTD. DEXA scheduled for 11/2020.   MOST form reviewed and updated, full care.  F/u 6 months for AWV/med check.  Fasting labs prior CBC, TSH, c-met, lipids  Medicare Attestation I have personally reviewed: The patient's medical and social history Their use of alcohol, tobacco or illicit drugs Their current medications and supplements The patient's functional ability including ADLs,fall risks, home safety risks, cognitive, and hearing and visual impairment Diet and physical activities Evidence for depression or mood disorders  The patient's weight, height, BMI have been recorded in the chart.  I have made referrals, counseling, and provided education to the patient based on review of the above and I have provided the patient with a written personalized care plan for  preventive services.

## 2020-09-19 ENCOUNTER — Encounter: Payer: Self-pay | Admitting: Family Medicine

## 2020-09-19 ENCOUNTER — Ambulatory Visit (INDEPENDENT_AMBULATORY_CARE_PROVIDER_SITE_OTHER): Payer: Medicare Other | Admitting: Family Medicine

## 2020-09-19 ENCOUNTER — Other Ambulatory Visit: Payer: Self-pay

## 2020-09-19 VITALS — BP 112/68 | HR 64 | Ht <= 58 in | Wt 127.8 lb

## 2020-09-19 DIAGNOSIS — I480 Paroxysmal atrial fibrillation: Secondary | ICD-10-CM | POA: Diagnosis not present

## 2020-09-19 DIAGNOSIS — G4733 Obstructive sleep apnea (adult) (pediatric): Secondary | ICD-10-CM

## 2020-09-19 DIAGNOSIS — R29898 Other symptoms and signs involving the musculoskeletal system: Secondary | ICD-10-CM | POA: Diagnosis not present

## 2020-09-19 DIAGNOSIS — E782 Mixed hyperlipidemia: Secondary | ICD-10-CM | POA: Diagnosis not present

## 2020-09-19 DIAGNOSIS — M5489 Other dorsalgia: Secondary | ICD-10-CM | POA: Diagnosis not present

## 2020-09-19 DIAGNOSIS — Z5181 Encounter for therapeutic drug level monitoring: Secondary | ICD-10-CM | POA: Diagnosis not present

## 2020-09-19 DIAGNOSIS — Z0000 Encounter for general adult medical examination without abnormal findings: Secondary | ICD-10-CM

## 2020-09-19 DIAGNOSIS — M85852 Other specified disorders of bone density and structure, left thigh: Secondary | ICD-10-CM

## 2020-09-19 DIAGNOSIS — Z7901 Long term (current) use of anticoagulants: Secondary | ICD-10-CM | POA: Diagnosis not present

## 2020-09-19 DIAGNOSIS — M546 Pain in thoracic spine: Secondary | ICD-10-CM | POA: Diagnosis not present

## 2020-09-19 DIAGNOSIS — E890 Postprocedural hypothyroidism: Secondary | ICD-10-CM

## 2020-09-19 MED ORDER — ROSUVASTATIN CALCIUM 20 MG PO TABS: 20.0000 mg | ORAL_TABLET | Freq: Every day | ORAL | 1 refills | Status: DC

## 2020-09-20 DIAGNOSIS — R29898 Other symptoms and signs involving the musculoskeletal system: Secondary | ICD-10-CM | POA: Diagnosis not present

## 2020-09-20 DIAGNOSIS — M546 Pain in thoracic spine: Secondary | ICD-10-CM | POA: Diagnosis not present

## 2020-09-20 DIAGNOSIS — M5489 Other dorsalgia: Secondary | ICD-10-CM | POA: Diagnosis not present

## 2020-09-20 LAB — CBC WITH DIFFERENTIAL/PLATELET
Basophils Absolute: 0 10*3/uL (ref 0.0–0.2)
Basos: 1 %
EOS (ABSOLUTE): 0.1 10*3/uL (ref 0.0–0.4)
Eos: 2 %
Hematocrit: 39.8 % (ref 34.0–46.6)
Hemoglobin: 13.8 g/dL (ref 11.1–15.9)
Immature Grans (Abs): 0 10*3/uL (ref 0.0–0.1)
Immature Granulocytes: 0 %
Lymphocytes Absolute: 1 10*3/uL (ref 0.7–3.1)
Lymphs: 19 %
MCH: 34.3 pg — ABNORMAL HIGH (ref 26.6–33.0)
MCHC: 34.7 g/dL (ref 31.5–35.7)
MCV: 99 fL — ABNORMAL HIGH (ref 79–97)
Monocytes Absolute: 0.6 10*3/uL (ref 0.1–0.9)
Monocytes: 11 %
Neutrophils Absolute: 3.4 10*3/uL (ref 1.4–7.0)
Neutrophils: 67 %
Platelets: 250 10*3/uL (ref 150–450)
RBC: 4.02 x10E6/uL (ref 3.77–5.28)
RDW: 12.1 % (ref 11.7–15.4)
WBC: 5 10*3/uL (ref 3.4–10.8)

## 2020-09-20 LAB — TSH: TSH: 6.25 u[IU]/mL — ABNORMAL HIGH (ref 0.450–4.500)

## 2020-09-20 MED ORDER — SYNTHROID 50 MCG PO TABS
ORAL_TABLET | ORAL | 0 refills | Status: DC
Start: 2020-09-20 — End: 2020-12-31

## 2020-09-20 NOTE — Progress Notes (Signed)
Please advise pt that her TSH was showing that thyroid is a little underactive still.  Current dose is daily except on Sundays.  I'm going to increase to have her take 2 pills on Saturdays and Sundays, and stay on 1 pill Monday through Friday.  I'm sending in a new prescription for her.  Please set up a lab visit for 6 weeks. I'll enter order and send her prescription now.  Rest of labs were fine.

## 2020-09-24 DIAGNOSIS — M546 Pain in thoracic spine: Secondary | ICD-10-CM | POA: Diagnosis not present

## 2020-09-24 DIAGNOSIS — M5489 Other dorsalgia: Secondary | ICD-10-CM | POA: Diagnosis not present

## 2020-09-24 DIAGNOSIS — R29898 Other symptoms and signs involving the musculoskeletal system: Secondary | ICD-10-CM | POA: Diagnosis not present

## 2020-09-24 NOTE — Progress Notes (Signed)
Abigail Day Date of Birth: 1934/02/28 Medical Record #[id number]  History of Present Illness: Abigail Day is seen for follow up of Afib. She has a history of paroxysmal atrial fib - on Eliquis. EF is normal per echo back in December of 2013. Normal Myoview in June 2015. Other issues include HLD, hypothyroidism with prior ablation for hyperthyroidism, and diverticulosis. She is  on CPAP therapy for sleep apnea-followed by pulmonary. In 2019 she had some scapular pain and dyspnea. We repeated an Echo and Myoview study which were unremarkable.  Previously she had change in thyroid dose. This is getting back to acceptable range. No palpitations now. No dyspnea. She is getting massage therapy for back spasms.  She is on CPAP and doing well. Stays active.    Current Outpatient Medications on File Prior to Visit  Medication Sig Dispense Refill   Calcium Carbonate-Vitamin D (CALCIUM 600 + D PO) Take 2 tablets by mouth daily.     cholestyramine (QUESTRAN) 4 g packet Take 1 packet (4 g total) by mouth 3 (three) times daily with meals. 60 each 2   co-enzyme Q-10 30 MG capsule Take 30 mg by mouth daily.     ELIQUIS 2.5 MG TABS tablet Take 1 tablet by mouth twice daily 180 tablet 1   fluticasone (FLONASE) 50 MCG/ACT nasal spray Use 2 spray(s) in each nostril once daily 48 g 0   loratadine (CLARITIN) 10 MG tablet Take 10 mg by mouth daily.     metoprolol tartrate (LOPRESSOR) 25 MG tablet Take 1 tablet (25 mg total) by mouth 2 (two) times daily. 180 tablet 3   Misc Natural Products (OSTEO BI-FLEX JOINT SHIELD PO) Take 2 tablets by mouth daily.     Multiple Vitamin (MULTI-VITAMIN) tablet Take 1 tablet by mouth daily.     Multiple Vitamins-Minerals (ICAPS PO) Take 1 tablet by mouth daily.     Omega-3 Fatty Acids (FISH OIL OMEGA-3 PO) Take 3,600 mg by mouth daily.      PREVIDENT 5000 PLUS 1.1 % CREA dental cream See admin instructions.  3   Probiotic Product (PROBIOTIC PO) Take 1 capsule by mouth daily.      rosuvastatin (CRESTOR) 20 MG tablet Take 1 tablet (20 mg total) by mouth daily. 90 tablet 1   SYNTHROID 50 MCG tablet Take 1 tablet daily before breakfast 5 days/week; take 2 tablets on Saturdays and Sundays 145 tablet 0   No current facility-administered medications on file prior to visit.    No Known Allergies  Past Medical History:  Diagnosis Date   Atrial fibrillation (HCC)    Diverticulosis    Dyspnea on exertion 02/18/2012   Eye exam abnormal 05/08/15   Dr.Groat-glaucoma suspect   Glaucoma suspect    Hemorrhoids    internal and external   Hemorrhoids 09/25/10   internal and external   Hyperplastic colon polyp 09/25/10   Dr. Ewing Schlein   Osteopenia    Palpitations 02/18/2012   Paroxysmal atrial fibrillation (HCC)    Pure hypercholesterolemia    Sleep apnea    Unspecified hypothyroidism    s/p ablation for hyperthyroidism   Urge urinary incontinence     Past Surgical History:  Procedure Laterality Date   ABDOMINAL HYSTERECTOMY  age 79   with BSO and bladder tack   COLONOSCOPY  11/06, 09/25/10, 12/2015   hyperplastic polyp 2012;    ESOPHAGOGASTRODUODENOSCOPY  09/25/10   tortuous esophagus, intact Nissen fundoplication, dilated the spasm at esophageal GE junction   EYE SURGERY  bilateral cataract surgery   LAPAROSCOPIC NISSEN FUNDOPLICATION  8/07   Dr. Daphine Deutscher   LARYNGOSCOPY  11/24/2017   dx.GERD,start omeprazole    Social History   Tobacco Use  Smoking Status Never  Smokeless Tobacco Never    Social History   Substance and Sexual Activity  Alcohol Use Yes   Alcohol/week: 0.0 standard drinks   Comment: once a month or less    Family History  Problem Relation Age of Onset   Heart disease Mother    Heart disease Father    Diabetes Father    Marfan syndrome Sister    Heart disease Sister        valve replacement   Heart disease Sister        pacemaker   Aortic aneurysm Sister    Peripheral Artery Disease Sister        amputation of toes   Breast  cancer Sister 53   Heart disease Sister        CABG at 56   Colon cancer Sister 47   Heart disease Sister        CABG   Heart disease Sister    Heart disease Sister    Hyperlipidemia Sister    Stroke Sister        in 1948, mild   Heart disease Brother    Diabetes Brother    Heart disease Brother    Diabetes Brother    Heart disease Brother    Heart disease Brother    Heart disease Brother        CABG   Heart disease Brother        CABG   Heart disease Brother        stent    Review of Systems: The review of systems is per the HPI.  All other systems were reviewed and are negative.  Physical Exam: BP 130/70 (BP Location: Right Arm)   Pulse (!) 59   Ht 4\' 10"  (1.473 m)   Wt 126 lb 9.6 oz (57.4 kg)   BMI 26.46 kg/m  GENERAL:  [name] in NAD HEENT:  PERRL, EOMI, sclera are clear. Oropharynx is clear. NECK:  No jugular venous distention, carotid upstroke brisk and symmetric, no bruits, no thyromegaly or adenopathy LUNGS:  Clear to auscultation bilaterally CHEST:  Unremarkable HEART:  RRR,  PMI not displaced or sustained,S1 and S2 within normal limits, no S3, no S4: no clicks, no rubs, no murmurs ABD:  Soft, nontender. BS +, no masses or bruits. No hepatomegaly, no splenomegaly EXT:  2 + pulses throughout, no edema, no cyanosis no clubbing SKIN:  Warm and dry.  No rashes NEURO:  Alert and oriented x 3. Cranial nerves II through XII intact. PSYCH:  Cognitively intact    LABORATORY DATA:     Lab Results  Component Value Date   WBC 5.0 09/19/2020   HGB 13.8 09/19/2020   HCT 39.8 09/19/2020   PLT 250 09/19/2020   GLUCOSE 98 03/14/2020   CHOL 144 03/14/2020   TRIG 148 03/14/2020   HDL 52 03/14/2020   LDLCALC 67 03/14/2020   ALT 21 03/14/2020   AST 23 03/14/2020   NA 140 03/14/2020   K 4.2 03/14/2020   CL 103 03/14/2020   CREATININE 0.92 03/14/2020   BUN 15 03/14/2020   CO2 23 03/14/2020   TSH 6.250 (H) 09/19/2020   HGBA1C 5.4  09/24/2015   Ecg today shows NSR rate 59. Normal Ecg I have personally  reviewed and interpreted this study.  Echo 09/23/17: Study Conclusions   - Left ventricle: The cavity size was normal. Wall thickness was   normal. Systolic function was normal. The estimated ejection   fraction was in the range of 60% to 65%. Doppler parameters are   consistent with abnormal left ventricular relaxation (grade 1   diastolic dysfunction). The Ee&' ratio is between 8-15, suggesting   indeterminate LV filling pressure. - Left atrium: The atrium was normal in size. - Tricuspid valve: There was mild regurgitation. - Pulmonary arteries: PA peak pressure: 27 mm Hg (S). - Inferior vena cava: The vessel was normal in size. The   respirophasic diameter changes were in the normal range (>= 50%),   consistent with normal central venous pressure.   Impressions:   - Compared to a prior study in 2013, the LVEF is unchanged. Left   and right atria measure normal in size.  Myoview 09/29/17: Study Highlights     Nuclear stress EF: 55%. No T wave inversion was noted during stress. There was no ST segment deviation noted during stress. The study is normal. This is a low risk study.   Normal perfusion. LVEF 55% with normal wall motion. This is a low risk study. No change compared to prior study.      Assessment / Plan:  1. Obstructive sleep apnea. On CPAP therapy  2. PAF - treated with rate control with metoprolol and Eliquis.  Now in NSR. Continue current therapy  3. Hypercholesterolemia. Most recent lipid panel showed excellent LDL of 67 on Crestor.  4. Hypothyroidism. Followed closely by Dr Lynelle Doctor.    Follow up in 6 months.

## 2020-09-26 ENCOUNTER — Encounter: Payer: Self-pay | Admitting: Cardiology

## 2020-09-26 ENCOUNTER — Ambulatory Visit (INDEPENDENT_AMBULATORY_CARE_PROVIDER_SITE_OTHER): Payer: Medicare Other | Admitting: Cardiology

## 2020-09-26 ENCOUNTER — Other Ambulatory Visit: Payer: Self-pay

## 2020-09-26 VITALS — BP 130/70 | HR 59 | Ht <= 58 in | Wt 126.6 lb

## 2020-09-26 DIAGNOSIS — G4733 Obstructive sleep apnea (adult) (pediatric): Secondary | ICD-10-CM | POA: Diagnosis not present

## 2020-09-26 DIAGNOSIS — E782 Mixed hyperlipidemia: Secondary | ICD-10-CM

## 2020-09-26 DIAGNOSIS — I480 Paroxysmal atrial fibrillation: Secondary | ICD-10-CM | POA: Diagnosis not present

## 2020-09-27 DIAGNOSIS — M546 Pain in thoracic spine: Secondary | ICD-10-CM | POA: Diagnosis not present

## 2020-09-27 DIAGNOSIS — R29898 Other symptoms and signs involving the musculoskeletal system: Secondary | ICD-10-CM | POA: Diagnosis not present

## 2020-09-27 DIAGNOSIS — M5489 Other dorsalgia: Secondary | ICD-10-CM | POA: Diagnosis not present

## 2020-09-28 DIAGNOSIS — R29898 Other symptoms and signs involving the musculoskeletal system: Secondary | ICD-10-CM | POA: Diagnosis not present

## 2020-09-28 DIAGNOSIS — M5489 Other dorsalgia: Secondary | ICD-10-CM | POA: Diagnosis not present

## 2020-09-28 DIAGNOSIS — M546 Pain in thoracic spine: Secondary | ICD-10-CM | POA: Diagnosis not present

## 2020-10-01 DIAGNOSIS — M546 Pain in thoracic spine: Secondary | ICD-10-CM | POA: Diagnosis not present

## 2020-10-01 DIAGNOSIS — R29898 Other symptoms and signs involving the musculoskeletal system: Secondary | ICD-10-CM | POA: Diagnosis not present

## 2020-10-01 DIAGNOSIS — M5489 Other dorsalgia: Secondary | ICD-10-CM | POA: Diagnosis not present

## 2020-10-03 DIAGNOSIS — R29898 Other symptoms and signs involving the musculoskeletal system: Secondary | ICD-10-CM | POA: Diagnosis not present

## 2020-10-03 DIAGNOSIS — M5489 Other dorsalgia: Secondary | ICD-10-CM | POA: Diagnosis not present

## 2020-10-03 DIAGNOSIS — M546 Pain in thoracic spine: Secondary | ICD-10-CM | POA: Diagnosis not present

## 2020-10-05 DIAGNOSIS — R29898 Other symptoms and signs involving the musculoskeletal system: Secondary | ICD-10-CM | POA: Diagnosis not present

## 2020-10-05 DIAGNOSIS — M546 Pain in thoracic spine: Secondary | ICD-10-CM | POA: Diagnosis not present

## 2020-10-05 DIAGNOSIS — M5489 Other dorsalgia: Secondary | ICD-10-CM | POA: Diagnosis not present

## 2020-10-09 DIAGNOSIS — R29898 Other symptoms and signs involving the musculoskeletal system: Secondary | ICD-10-CM | POA: Diagnosis not present

## 2020-10-09 DIAGNOSIS — M546 Pain in thoracic spine: Secondary | ICD-10-CM | POA: Diagnosis not present

## 2020-10-09 DIAGNOSIS — M5489 Other dorsalgia: Secondary | ICD-10-CM | POA: Diagnosis not present

## 2020-10-10 DIAGNOSIS — R29898 Other symptoms and signs involving the musculoskeletal system: Secondary | ICD-10-CM | POA: Diagnosis not present

## 2020-10-10 DIAGNOSIS — M546 Pain in thoracic spine: Secondary | ICD-10-CM | POA: Diagnosis not present

## 2020-10-10 DIAGNOSIS — M5489 Other dorsalgia: Secondary | ICD-10-CM | POA: Diagnosis not present

## 2020-10-12 DIAGNOSIS — R29898 Other symptoms and signs involving the musculoskeletal system: Secondary | ICD-10-CM | POA: Diagnosis not present

## 2020-10-12 DIAGNOSIS — M546 Pain in thoracic spine: Secondary | ICD-10-CM | POA: Diagnosis not present

## 2020-10-12 DIAGNOSIS — M5489 Other dorsalgia: Secondary | ICD-10-CM | POA: Diagnosis not present

## 2020-10-15 DIAGNOSIS — M5489 Other dorsalgia: Secondary | ICD-10-CM | POA: Diagnosis not present

## 2020-10-15 DIAGNOSIS — R29898 Other symptoms and signs involving the musculoskeletal system: Secondary | ICD-10-CM | POA: Diagnosis not present

## 2020-10-15 DIAGNOSIS — M546 Pain in thoracic spine: Secondary | ICD-10-CM | POA: Diagnosis not present

## 2020-10-17 DIAGNOSIS — R29898 Other symptoms and signs involving the musculoskeletal system: Secondary | ICD-10-CM | POA: Diagnosis not present

## 2020-10-17 DIAGNOSIS — M5489 Other dorsalgia: Secondary | ICD-10-CM | POA: Diagnosis not present

## 2020-10-17 DIAGNOSIS — M546 Pain in thoracic spine: Secondary | ICD-10-CM | POA: Diagnosis not present

## 2020-10-19 DIAGNOSIS — M546 Pain in thoracic spine: Secondary | ICD-10-CM | POA: Diagnosis not present

## 2020-10-19 DIAGNOSIS — R29898 Other symptoms and signs involving the musculoskeletal system: Secondary | ICD-10-CM | POA: Diagnosis not present

## 2020-10-19 DIAGNOSIS — M5489 Other dorsalgia: Secondary | ICD-10-CM | POA: Diagnosis not present

## 2020-10-22 DIAGNOSIS — M546 Pain in thoracic spine: Secondary | ICD-10-CM | POA: Diagnosis not present

## 2020-10-22 DIAGNOSIS — R29898 Other symptoms and signs involving the musculoskeletal system: Secondary | ICD-10-CM | POA: Diagnosis not present

## 2020-10-22 DIAGNOSIS — M5489 Other dorsalgia: Secondary | ICD-10-CM | POA: Diagnosis not present

## 2020-10-24 DIAGNOSIS — M5489 Other dorsalgia: Secondary | ICD-10-CM | POA: Diagnosis not present

## 2020-10-24 DIAGNOSIS — M546 Pain in thoracic spine: Secondary | ICD-10-CM | POA: Diagnosis not present

## 2020-10-24 DIAGNOSIS — R29898 Other symptoms and signs involving the musculoskeletal system: Secondary | ICD-10-CM | POA: Diagnosis not present

## 2020-10-26 DIAGNOSIS — M5489 Other dorsalgia: Secondary | ICD-10-CM | POA: Diagnosis not present

## 2020-10-26 DIAGNOSIS — M546 Pain in thoracic spine: Secondary | ICD-10-CM | POA: Diagnosis not present

## 2020-10-26 DIAGNOSIS — R29898 Other symptoms and signs involving the musculoskeletal system: Secondary | ICD-10-CM | POA: Diagnosis not present

## 2020-10-29 DIAGNOSIS — M5489 Other dorsalgia: Secondary | ICD-10-CM | POA: Diagnosis not present

## 2020-10-29 DIAGNOSIS — R29898 Other symptoms and signs involving the musculoskeletal system: Secondary | ICD-10-CM | POA: Diagnosis not present

## 2020-10-29 DIAGNOSIS — M546 Pain in thoracic spine: Secondary | ICD-10-CM | POA: Diagnosis not present

## 2020-10-30 DIAGNOSIS — M5134 Other intervertebral disc degeneration, thoracic region: Secondary | ICD-10-CM | POA: Diagnosis not present

## 2020-10-30 DIAGNOSIS — M5416 Radiculopathy, lumbar region: Secondary | ICD-10-CM | POA: Diagnosis not present

## 2020-11-01 ENCOUNTER — Other Ambulatory Visit: Payer: Medicare Other

## 2020-11-01 ENCOUNTER — Other Ambulatory Visit: Payer: Self-pay

## 2020-11-01 DIAGNOSIS — E890 Postprocedural hypothyroidism: Secondary | ICD-10-CM | POA: Diagnosis not present

## 2020-11-02 DIAGNOSIS — R29898 Other symptoms and signs involving the musculoskeletal system: Secondary | ICD-10-CM | POA: Diagnosis not present

## 2020-11-02 DIAGNOSIS — M546 Pain in thoracic spine: Secondary | ICD-10-CM | POA: Diagnosis not present

## 2020-11-02 DIAGNOSIS — M5489 Other dorsalgia: Secondary | ICD-10-CM | POA: Diagnosis not present

## 2020-11-02 LAB — TSH: TSH: 1.97 u[IU]/mL (ref 0.450–4.500)

## 2020-11-05 DIAGNOSIS — M546 Pain in thoracic spine: Secondary | ICD-10-CM | POA: Diagnosis not present

## 2020-11-05 DIAGNOSIS — R29898 Other symptoms and signs involving the musculoskeletal system: Secondary | ICD-10-CM | POA: Diagnosis not present

## 2020-11-05 DIAGNOSIS — M5489 Other dorsalgia: Secondary | ICD-10-CM | POA: Diagnosis not present

## 2020-11-06 ENCOUNTER — Other Ambulatory Visit: Payer: Self-pay

## 2020-11-06 ENCOUNTER — Ambulatory Visit
Admission: RE | Admit: 2020-11-06 | Discharge: 2020-11-06 | Disposition: A | Discharge status: Home / Self Care | Payer: Medicare Other | Source: Ambulatory Visit | Attending: Family Medicine | Admitting: Family Medicine

## 2020-11-06 DIAGNOSIS — M85852 Other specified disorders of bone density and structure, left thigh: Secondary | ICD-10-CM

## 2020-11-07 DIAGNOSIS — M5489 Other dorsalgia: Secondary | ICD-10-CM | POA: Diagnosis not present

## 2020-11-07 DIAGNOSIS — M546 Pain in thoracic spine: Secondary | ICD-10-CM | POA: Diagnosis not present

## 2020-11-07 DIAGNOSIS — R29898 Other symptoms and signs involving the musculoskeletal system: Secondary | ICD-10-CM | POA: Diagnosis not present

## 2020-11-09 DIAGNOSIS — M546 Pain in thoracic spine: Secondary | ICD-10-CM | POA: Diagnosis not present

## 2020-11-09 DIAGNOSIS — R29898 Other symptoms and signs involving the musculoskeletal system: Secondary | ICD-10-CM | POA: Diagnosis not present

## 2020-11-09 DIAGNOSIS — M5489 Other dorsalgia: Secondary | ICD-10-CM | POA: Diagnosis not present

## 2020-11-12 DIAGNOSIS — M546 Pain in thoracic spine: Secondary | ICD-10-CM | POA: Diagnosis not present

## 2020-11-12 DIAGNOSIS — M5489 Other dorsalgia: Secondary | ICD-10-CM | POA: Diagnosis not present

## 2020-11-12 DIAGNOSIS — R29898 Other symptoms and signs involving the musculoskeletal system: Secondary | ICD-10-CM | POA: Diagnosis not present

## 2020-11-14 ENCOUNTER — Telehealth: Payer: Self-pay | Admitting: Cardiology

## 2020-11-14 DIAGNOSIS — M5489 Other dorsalgia: Secondary | ICD-10-CM | POA: Diagnosis not present

## 2020-11-14 DIAGNOSIS — R29898 Other symptoms and signs involving the musculoskeletal system: Secondary | ICD-10-CM | POA: Diagnosis not present

## 2020-11-14 DIAGNOSIS — M546 Pain in thoracic spine: Secondary | ICD-10-CM | POA: Diagnosis not present

## 2020-11-14 NOTE — Telephone Encounter (Signed)
  Pt is wanting to speak w/ nurse Elnita Maxwell about an appt. pt didnt want to provide details.. please advise

## 2020-11-14 NOTE — Telephone Encounter (Signed)
Spoke to patient stated she needed a follow up appointment with Dr.Jordan in January.Appointment scheduled 03/05/21 at 9:40 am.

## 2020-11-14 NOTE — Telephone Encounter (Signed)
Pt states that she will be going to dinner at a quarter til 5... If you're not able to reach her you can call  her tomorrow

## 2020-11-16 DIAGNOSIS — R29898 Other symptoms and signs involving the musculoskeletal system: Secondary | ICD-10-CM | POA: Diagnosis not present

## 2020-11-16 DIAGNOSIS — M5489 Other dorsalgia: Secondary | ICD-10-CM | POA: Diagnosis not present

## 2020-11-16 DIAGNOSIS — M546 Pain in thoracic spine: Secondary | ICD-10-CM | POA: Diagnosis not present

## 2020-11-20 DIAGNOSIS — R29898 Other symptoms and signs involving the musculoskeletal system: Secondary | ICD-10-CM | POA: Diagnosis not present

## 2020-11-20 DIAGNOSIS — M5489 Other dorsalgia: Secondary | ICD-10-CM | POA: Diagnosis not present

## 2020-11-20 DIAGNOSIS — M546 Pain in thoracic spine: Secondary | ICD-10-CM | POA: Diagnosis not present

## 2020-11-21 DIAGNOSIS — R29898 Other symptoms and signs involving the musculoskeletal system: Secondary | ICD-10-CM | POA: Diagnosis not present

## 2020-11-21 DIAGNOSIS — M546 Pain in thoracic spine: Secondary | ICD-10-CM | POA: Diagnosis not present

## 2020-11-21 DIAGNOSIS — M5489 Other dorsalgia: Secondary | ICD-10-CM | POA: Diagnosis not present

## 2020-11-23 DIAGNOSIS — M546 Pain in thoracic spine: Secondary | ICD-10-CM | POA: Diagnosis not present

## 2020-11-23 DIAGNOSIS — R29898 Other symptoms and signs involving the musculoskeletal system: Secondary | ICD-10-CM | POA: Diagnosis not present

## 2020-11-23 DIAGNOSIS — M5489 Other dorsalgia: Secondary | ICD-10-CM | POA: Diagnosis not present

## 2020-11-26 DIAGNOSIS — R29898 Other symptoms and signs involving the musculoskeletal system: Secondary | ICD-10-CM | POA: Diagnosis not present

## 2020-11-26 DIAGNOSIS — M546 Pain in thoracic spine: Secondary | ICD-10-CM | POA: Diagnosis not present

## 2020-11-26 DIAGNOSIS — M5489 Other dorsalgia: Secondary | ICD-10-CM | POA: Diagnosis not present

## 2020-11-28 DIAGNOSIS — M546 Pain in thoracic spine: Secondary | ICD-10-CM | POA: Diagnosis not present

## 2020-11-28 DIAGNOSIS — R29898 Other symptoms and signs involving the musculoskeletal system: Secondary | ICD-10-CM | POA: Diagnosis not present

## 2020-11-28 DIAGNOSIS — M5489 Other dorsalgia: Secondary | ICD-10-CM | POA: Diagnosis not present

## 2020-11-30 DIAGNOSIS — M546 Pain in thoracic spine: Secondary | ICD-10-CM | POA: Diagnosis not present

## 2020-11-30 DIAGNOSIS — M5489 Other dorsalgia: Secondary | ICD-10-CM | POA: Diagnosis not present

## 2020-11-30 DIAGNOSIS — R29898 Other symptoms and signs involving the musculoskeletal system: Secondary | ICD-10-CM | POA: Diagnosis not present

## 2020-12-03 DIAGNOSIS — M5489 Other dorsalgia: Secondary | ICD-10-CM | POA: Diagnosis not present

## 2020-12-03 DIAGNOSIS — M546 Pain in thoracic spine: Secondary | ICD-10-CM | POA: Diagnosis not present

## 2020-12-03 DIAGNOSIS — R29898 Other symptoms and signs involving the musculoskeletal system: Secondary | ICD-10-CM | POA: Diagnosis not present

## 2020-12-05 DIAGNOSIS — M546 Pain in thoracic spine: Secondary | ICD-10-CM | POA: Diagnosis not present

## 2020-12-05 DIAGNOSIS — R29898 Other symptoms and signs involving the musculoskeletal system: Secondary | ICD-10-CM | POA: Diagnosis not present

## 2020-12-05 DIAGNOSIS — M5489 Other dorsalgia: Secondary | ICD-10-CM | POA: Diagnosis not present

## 2020-12-07 DIAGNOSIS — M546 Pain in thoracic spine: Secondary | ICD-10-CM | POA: Diagnosis not present

## 2020-12-07 DIAGNOSIS — M5489 Other dorsalgia: Secondary | ICD-10-CM | POA: Diagnosis not present

## 2020-12-07 DIAGNOSIS — R29898 Other symptoms and signs involving the musculoskeletal system: Secondary | ICD-10-CM | POA: Diagnosis not present

## 2020-12-10 DIAGNOSIS — M546 Pain in thoracic spine: Secondary | ICD-10-CM | POA: Diagnosis not present

## 2020-12-10 DIAGNOSIS — M5489 Other dorsalgia: Secondary | ICD-10-CM | POA: Diagnosis not present

## 2020-12-10 DIAGNOSIS — R29898 Other symptoms and signs involving the musculoskeletal system: Secondary | ICD-10-CM | POA: Diagnosis not present

## 2020-12-12 DIAGNOSIS — M5489 Other dorsalgia: Secondary | ICD-10-CM | POA: Diagnosis not present

## 2020-12-12 DIAGNOSIS — R29898 Other symptoms and signs involving the musculoskeletal system: Secondary | ICD-10-CM | POA: Diagnosis not present

## 2020-12-12 DIAGNOSIS — M546 Pain in thoracic spine: Secondary | ICD-10-CM | POA: Diagnosis not present

## 2020-12-14 DIAGNOSIS — M546 Pain in thoracic spine: Secondary | ICD-10-CM | POA: Diagnosis not present

## 2020-12-14 DIAGNOSIS — M5489 Other dorsalgia: Secondary | ICD-10-CM | POA: Diagnosis not present

## 2020-12-14 DIAGNOSIS — R29898 Other symptoms and signs involving the musculoskeletal system: Secondary | ICD-10-CM | POA: Diagnosis not present

## 2020-12-18 ENCOUNTER — Other Ambulatory Visit: Payer: Self-pay

## 2020-12-18 ENCOUNTER — Other Ambulatory Visit (INDEPENDENT_AMBULATORY_CARE_PROVIDER_SITE_OTHER): Payer: Medicare Other

## 2020-12-18 DIAGNOSIS — Z23 Encounter for immunization: Secondary | ICD-10-CM

## 2020-12-28 ENCOUNTER — Other Ambulatory Visit: Payer: Self-pay | Admitting: Cardiology

## 2020-12-28 ENCOUNTER — Ambulatory Visit
Admission: RE | Admit: 2020-12-28 | Discharge: 2020-12-28 | Disposition: A | Discharge status: Home / Self Care | Payer: Medicare Other | Source: Ambulatory Visit | Attending: Family Medicine | Admitting: Family Medicine

## 2020-12-28 ENCOUNTER — Other Ambulatory Visit: Payer: Self-pay

## 2020-12-28 DIAGNOSIS — M8589 Other specified disorders of bone density and structure, multiple sites: Secondary | ICD-10-CM | POA: Diagnosis not present

## 2020-12-28 NOTE — Telephone Encounter (Signed)
Prescription refill request for Eliquis received. Indication:afib Last office visit:jordan 09/26/20 Scr:0.92 03/14/20 Age: 73f Weight:57.4kg

## 2020-12-31 ENCOUNTER — Other Ambulatory Visit: Payer: Self-pay | Admitting: Family Medicine

## 2020-12-31 DIAGNOSIS — E890 Postprocedural hypothyroidism: Secondary | ICD-10-CM

## 2021-01-07 DIAGNOSIS — Z20822 Contact with and (suspected) exposure to covid-19: Secondary | ICD-10-CM | POA: Diagnosis not present

## 2021-01-14 ENCOUNTER — Encounter: Payer: Self-pay | Admitting: Family Medicine

## 2021-01-14 ENCOUNTER — Ambulatory Visit (INDEPENDENT_AMBULATORY_CARE_PROVIDER_SITE_OTHER): Payer: Medicare Other | Admitting: Family Medicine

## 2021-01-14 ENCOUNTER — Other Ambulatory Visit: Payer: Self-pay

## 2021-01-14 VITALS — BP 128/68 | HR 60 | Temp 98.4°F | Ht <= 58 in | Wt 125.8 lb

## 2021-01-14 DIAGNOSIS — R197 Diarrhea, unspecified: Secondary | ICD-10-CM

## 2021-01-14 DIAGNOSIS — R1084 Generalized abdominal pain: Secondary | ICD-10-CM | POA: Diagnosis not present

## 2021-01-14 NOTE — Progress Notes (Signed)
Chief Complaint  Patient presents with   Diarrhea    Diarrhea x 8 days. Took imodium on Monday of last week and it helped for two days, stools were solid after that and then diarrhea came back this morning.  Cramping and chills. Stomach fills up when she tries to eat. Has had a scratchy throat off and on for a couple of weeks.    11/6 she started with diarrhea.   She took 2 imodium the next morning.  Diarrhea stopped for 3 days, but was still having some gas and bloating.  Then she started having small amounts of stool passing.  She laid around most of the day yesterday, she felt weak, tired.  At 4 this morning she had recurrent diarrhea. She had 2 episodes of watery stool. Denies blood or mucus. She noticed slight blood on the toilet paper, but hemorrhoids are flaring. Denies abdominal pain.  Last night she had potato soup (creamy soup).  The rest of the week she had more broth-based soups.  She has h/o issues with diarrhea.  At one point she was prescribed Questran, but reports not taking it, that it made her stomach feel like it was swelling up. She is taking Probiotic once daily, which she has found helpful. She has known issues with lactose intolerance.  No recent antibiotics. No camping or travel outside of the country.  No foods that were undercooked, raw, spoiled. No sick contacts.  Last TSH was good Lab Results  Component Value Date   TSH 1.970 11/01/2020   PMH, PSH, SH reviewed  Outpatient Encounter Medications as of 01/14/2021  Medication Sig Note   apixaban (ELIQUIS) 2.5 MG TABS tablet Take 1 tablet by mouth twice daily    Calcium Carbonate-Vitamin D (CALCIUM 600 + D PO) Take 2 tablets by mouth daily.    co-enzyme Q-10 30 MG capsule Take 30 mg by mouth daily.    loratadine (CLARITIN) 10 MG tablet Take 10 mg by mouth daily.    metoprolol tartrate (LOPRESSOR) 25 MG tablet Take 1 tablet (25 mg total) by mouth 2 (two) times daily.    Misc Natural Products (OSTEO BI-FLEX JOINT  SHIELD PO) Take 2 tablets by mouth daily.    Multiple Vitamin (MULTI-VITAMIN) tablet Take 1 tablet by mouth daily.    Multiple Vitamins-Minerals (ICAPS PO) Take 1 tablet by mouth daily.    Omega-3 Fatty Acids (FISH OIL OMEGA-3 PO) Take 3,600 mg by mouth daily.  03/07/2019: Takes 2/day (got higher dose per pt, so cut back from 3)   PREVIDENT 5000 PLUS 1.1 % CREA dental cream See admin instructions.    Probiotic Product (PROBIOTIC PO) Take 1 capsule by mouth daily.    rosuvastatin (CRESTOR) 20 MG tablet Take 1 tablet (20 mg total) by mouth daily.    SYNTHROID 50 MCG tablet TAKE 1 TABLET BY MOUTH BEFORE BREAKFAST FOR  5  DAYS  PER  WEEK.  TAKE  2  TABLETS  ON  SATURDAY  AND  SUNDAY    cholestyramine (QUESTRAN) 4 g packet Take 1 packet (4 g total) by mouth 3 (three) times daily with meals. (Patient not taking: Reported on 01/14/2021) 09/19/2020: Rarely needs, probiotic helps   fluticasone (FLONASE) 50 MCG/ACT nasal spray Use 2 spray(s) in each nostril once daily (Patient not taking: Reported on 01/14/2021)    loperamide (IMODIUM A-D) 2 MG tablet Take 2 mg by mouth 4 (four) times daily as needed for diarrhea or loose stools. (Patient not taking: Reported on  01/14/2021) 01/14/2021: Took on Monday-helped for two days   [DISCONTINUED] fluocinonide gel (LIDEX) 0.05 % Apply topically 4 (four) times daily as needed.    No facility-administered encounter medications on file as of 01/14/2021.   No Known Allergies  ROS:  diarrhea per HPI. Denies nausea or vomiting. +decreased appetite.  She has pain L upper abdomen, but radiates all over, cramping. Denies dysuria, hematuria.  Denies any vaginal discharge. Denies chest pain, shortness of breath.  Feels weak, needs to rest when walking. Denies headaches or dizziness. Rare cough, some runny nose when eating. No bleeding (just slight BRB on toilet paper, hemorrhoids flaring), bruising. Moods are good.   PHYSICAL EXAM:  BP 128/68   Pulse 60   Temp 98.4 F  (36.9 C) (Tympanic)   Ht 4\' 10"  (1.473 m)   Wt 125 lb 12.8 oz (57.1 kg)   BMI 26.29 kg/m   Wt Readings from Last 3 Encounters:  01/14/21 125 lb 12.8 oz (57.1 kg)  09/26/20 126 lb 9.6 oz (57.4 kg)  09/19/20 127 lb 12.8 oz (58 kg)   Well-appearing, pleasant elderly female in no distress HEENT: conjunctiva and sclera are clear, EOMI, wearing mask Neck: no lymphadenopathy or mass Heart: regular rate and rhythm, no murmur Lungs: clear bilaterally Abdomen: Hyperactive bowel sounds throughout. Mild epigastric tenderness, slight RUQ and LUQ. No rebound tenderness or guarding. She is tender at LUQ, but on palpation there was a lot of gas moving around. No masses. She was tender over the L inferior floating rib. Back: no spinal or CVA tenderness Neuro: alert and oriented. Normal strength, gait Psych: normal mood, affect, hygiene and grooming   ASSESSMENT/PLAN:  Diarrhea, unspecified type - BRAT diet, avoid dairy. imodium prn.  f/u if sx persist/worsen. May need stool studies. Will start with labs - Plan: CBC with Differential/Platelet, Comprehensive metabolic panel  Generalized abdominal pain - has gas, bloating, hyperactive BS.  Tender mainly on L, so diverticulitis is in Ddx. Check labs. f/u if persists/worsens - Plan: CBC with Differential/Platelet, Comprehensive metabolic panel  BRAT diet Avoid dairy Imodium today. F/u if increasing pain, diarrhea, fever, or other concerns.  I spent 32 minutes dedicated to the care of this patient, including pre-visit review of records, face to face time, post-visit ordering of testing and documentation.

## 2021-01-14 NOTE — Patient Instructions (Signed)
Stay well hydrated--drink plenty of water (to keep the urine clear).  BRAT diet--bananas, rice, applesauce and toast. You may advance to other bland foods, such as grilled chicken. Avoid all spicy, greasy or fried foods.  You need to avoid all dairy for 5-7 days. The cream in the potato soup last night might have triggered the recurrent diarrhea.  You may use another dose of imodium today. If you develop more pain from trapped gas, you can try simethicone (Gas-X). If diarrhea persists, we may want to send off stool studies to look for infections or parasites.  If you develop fever, if you have worsening abdominal pain, then we may need to do further evaluation (imaging studies)

## 2021-01-15 LAB — CBC WITH DIFFERENTIAL/PLATELET
Basophils Absolute: 0 10*3/uL (ref 0.0–0.2)
Basos: 1 %
EOS (ABSOLUTE): 0.1 10*3/uL (ref 0.0–0.4)
Eos: 3 %
Hematocrit: 34.6 % (ref 34.0–46.6)
Hemoglobin: 12 g/dL (ref 11.1–15.9)
Immature Grans (Abs): 0 10*3/uL (ref 0.0–0.1)
Immature Granulocytes: 0 %
Lymphocytes Absolute: 0.9 10*3/uL (ref 0.7–3.1)
Lymphs: 26 %
MCH: 34.2 pg — ABNORMAL HIGH (ref 26.6–33.0)
MCHC: 34.7 g/dL (ref 31.5–35.7)
MCV: 99 fL — ABNORMAL HIGH (ref 79–97)
Monocytes Absolute: 0.5 10*3/uL (ref 0.1–0.9)
Monocytes: 13 %
Neutrophils Absolute: 2 10*3/uL (ref 1.4–7.0)
Neutrophils: 57 %
Platelets: 215 10*3/uL (ref 150–450)
RBC: 3.51 x10E6/uL — ABNORMAL LOW (ref 3.77–5.28)
RDW: 12 % (ref 11.7–15.4)
WBC: 3.4 10*3/uL (ref 3.4–10.8)

## 2021-01-15 LAB — COMPREHENSIVE METABOLIC PANEL
ALT: 16 IU/L (ref 0–32)
AST: 22 IU/L (ref 0–40)
Albumin/Globulin Ratio: 2.3 — ABNORMAL HIGH (ref 1.2–2.2)
Albumin: 4.5 g/dL (ref 3.6–4.6)
Alkaline Phosphatase: 78 IU/L (ref 44–121)
BUN/Creatinine Ratio: 14 (ref 12–28)
BUN: 11 mg/dL (ref 8–27)
Bilirubin Total: 0.3 mg/dL (ref 0.0–1.2)
CO2: 23 mmol/L (ref 20–29)
Calcium: 9.4 mg/dL (ref 8.7–10.3)
Chloride: 96 mmol/L (ref 96–106)
Creatinine, Ser: 0.76 mg/dL (ref 0.57–1.00)
Globulin, Total: 2 g/dL (ref 1.5–4.5)
Glucose: 110 mg/dL — ABNORMAL HIGH (ref 70–99)
Potassium: 4.2 mmol/L (ref 3.5–5.2)
Sodium: 134 mmol/L (ref 134–144)
Total Protein: 6.5 g/dL (ref 6.0–8.5)
eGFR: 77 mL/min/{1.73_m2} (ref >59)

## 2021-02-15 NOTE — Progress Notes (Signed)
Abigail Day Date of Birth: 06/07/1933 Medical Record #[id number]  History of Present Illness: Abigail Day is seen for follow up of Afib. She has a history of paroxysmal atrial fib - on Eliquis. EF is normal per echo back in December of 2013. Normal Myoview in June 2015. Other issues include HLD, hypothyroidism with prior ablation for hyperthyroidism, and diverticulosis. She is  on CPAP therapy for sleep apnea-followed by pulmonary. In 2019 she had some scapular pain and dyspnea. We repeated an Echo and Myoview study which were unremarkable.  She is doing well in follow up. Notes some fatigue at times and improves with a nap. No palpitations now. No dyspnea.  She is on CPAP and doing well. Last TSH in normal range.     Current Outpatient Medications on File Prior to Visit  Medication Sig Dispense Refill   apixaban (ELIQUIS) 2.5 MG TABS tablet Take 1 tablet by mouth twice daily 180 tablet 1   Calcium Carbonate-Vitamin D (CALCIUM 600 + D PO) Take 2 tablets by mouth daily.     cholestyramine (QUESTRAN) 4 g packet Take 1 packet (4 g total) by mouth 3 (three) times daily with meals. 60 each 2   co-enzyme Q-10 30 MG capsule Take 30 mg by mouth daily.     loperamide (IMODIUM A-D) 2 MG tablet Take 2 mg by mouth 4 (four) times daily as needed for diarrhea or loose stools.     loratadine (CLARITIN) 10 MG tablet Take 10 mg by mouth daily.     metoprolol tartrate (LOPRESSOR) 25 MG tablet Take 1 tablet (25 mg total) by mouth 2 (two) times daily. 180 tablet 3   Misc Natural Products (OSTEO BI-FLEX JOINT SHIELD PO) Take 2 tablets by mouth daily.     Multiple Vitamin (MULTI-VITAMIN) tablet Take 1 tablet by mouth daily.     Multiple Vitamins-Minerals (ICAPS PO) Take 1 tablet by mouth daily.     Omega-3 Fatty Acids (FISH OIL OMEGA-3 PO) Take 3,600 mg by mouth daily.      PREVIDENT 5000 PLUS 1.1 % CREA dental cream See admin instructions.  3   Probiotic Product (PROBIOTIC PO) Take 1 capsule by mouth daily.      rosuvastatin (CRESTOR) 20 MG tablet Take 1 tablet (20 mg total) by mouth daily. 90 tablet 1   SYNTHROID 50 MCG tablet TAKE 1 TABLET BY MOUTH BEFORE BREAKFAST FOR  5  DAYS  PER  WEEK.  TAKE  2  TABLETS  ON  SATURDAY  AND  SUNDAY 145 tablet 0   No current facility-administered medications on file prior to visit.    No Known Allergies  Past Medical History:  Diagnosis Date   Atrial fibrillation (HCC)    Diverticulosis    Dyspnea on exertion 02/18/2012   Eye exam abnormal 05/08/15   Dr.Groat-glaucoma suspect   Glaucoma suspect    Hemorrhoids    internal and external   Hemorrhoids 09/25/10   internal and external   Hyperplastic colon polyp 09/25/10   Dr. Ewing Schlein   Osteopenia    Palpitations 02/18/2012   Paroxysmal atrial fibrillation (HCC)    Pure hypercholesterolemia    Sleep apnea    Unspecified hypothyroidism    s/p ablation for hyperthyroidism   Urge urinary incontinence     Past Surgical History:  Procedure Laterality Date   ABDOMINAL HYSTERECTOMY  age 62   with BSO and bladder tack   COLONOSCOPY  11/06, 09/25/10, 12/2015   hyperplastic polyp 2012;  ESOPHAGOGASTRODUODENOSCOPY  09/25/10   tortuous esophagus, intact Nissen fundoplication, dilated the spasm at esophageal GE junction   EYE SURGERY     bilateral cataract surgery   LAPAROSCOPIC NISSEN FUNDOPLICATION  8/07   Dr. Daphine Deutscher   LARYNGOSCOPY  11/24/2017   dx.GERD,start omeprazole    Social History   Tobacco Use  Smoking Status Never  Smokeless Tobacco Never    Social History   Substance and Sexual Activity  Alcohol Use Yes   Alcohol/week: 0.0 standard drinks   Comment: once a month or less    Family History  Problem Relation Age of Onset   Heart disease Mother    Heart disease Father    Diabetes Father    Marfan syndrome Sister    Heart disease Sister        valve replacement   Heart disease Sister        pacemaker   Aortic aneurysm Sister    Peripheral Artery Disease Sister        amputation of  toes   Breast cancer Sister 76   Heart disease Sister        CABG at 35   Colon cancer Sister 63   Heart disease Sister        CABG   Heart disease Sister    Heart disease Sister    Hyperlipidemia Sister    Stroke Sister        in 1948, mild   Heart disease Brother    Diabetes Brother    Heart disease Brother    Diabetes Brother    Heart disease Brother    Heart disease Brother    Heart disease Brother        CABG   Heart disease Brother        CABG   Heart disease Brother        stent    Review of Systems: The review of systems is per the HPI.  All other systems were reviewed and are negative.  Physical Exam: BP 118/69    Pulse (!) 55    Ht 4\' 10"  (1.473 m)    Wt 125 lb (56.7 kg)    SpO2 98%    BMI 26.13 kg/m  GENERAL:  [name] in NAD HEENT:  PERRL, EOMI, sclera are clear. Oropharynx is clear. NECK:  No jugular venous distention, carotid upstroke brisk and symmetric, no bruits, no thyromegaly or adenopathy LUNGS:  Clear to auscultation bilaterally CHEST:  Unremarkable HEART:  RRR,  PMI not displaced or sustained,S1 and S2 within normal limits, no S3, no S4: no clicks, no rubs, no murmurs ABD:  Soft, nontender. BS +, no masses or bruits. No hepatomegaly, no splenomegaly EXT:  2 + pulses throughout, no edema, no cyanosis no clubbing SKIN:  Warm and dry.  No rashes NEURO:  Alert and oriented x 3. Cranial nerves II through XII intact. PSYCH:  Cognitively intact    LABORATORY DATA:     Lab Results  Component Value Date   WBC 3.4 01/14/2021   HGB 12.0 01/14/2021   HCT 34.6 01/14/2021   PLT 215 01/14/2021   GLUCOSE 110 (H) 01/14/2021   CHOL 144 03/14/2020   TRIG 148 03/14/2020   HDL 52 03/14/2020   LDLCALC 67 03/14/2020   ALT 16 01/14/2021   AST 22 01/14/2021   NA 134 01/14/2021   K 4.2 01/14/2021   CL 96 01/14/2021   CREATININE 0.76 01/14/2021   BUN 11 01/14/2021  CO2 23 01/14/2021   TSH 1.970 11/01/2020   HGBA1C 5.4 09/24/2015     Echo 09/23/17: Study Conclusions   - Left ventricle: The cavity size was normal. Wall thickness was   normal. Systolic function was normal. The estimated ejection   fraction was in the range of 60% to 65%. Doppler parameters are   consistent with abnormal left ventricular relaxation (grade 1   diastolic dysfunction). The Ee&' ratio is between 8-15, suggesting   indeterminate LV filling pressure. - Left atrium: The atrium was normal in size. - Tricuspid valve: There was mild regurgitation. - Pulmonary arteries: PA peak pressure: 27 mm Hg (S). - Inferior vena cava: The vessel was normal in size. The   respirophasic diameter changes were in the normal range (>= 50%),   consistent with normal central venous pressure.   Impressions:   - Compared to a prior study in 2013, the LVEF is unchanged. Left   and right atria measure normal in size.  Myoview 09/29/17: Study Highlights     Nuclear stress EF: 55%. No T wave inversion was noted during stress. There was no ST segment deviation noted during stress. The study is normal. This is a low risk study.   Normal perfusion. LVEF 55% with normal wall motion. This is a low risk study. No change compared to prior study.      Assessment / Plan:  1. Obstructive sleep apnea. On CPAP therapy  2. PAF - treated with rate control with metoprolol and Eliquis.  Now in NSR. Continue current therapy  3. Hypercholesterolemia. Most recent lipid panel showed excellent LDL of 67 on Crestor.  4. Hypothyroidism. TSH normal.   Follow up in 6 months.

## 2021-02-19 ENCOUNTER — Ambulatory Visit: Payer: Medicare Other | Admitting: Cardiology

## 2021-02-19 ENCOUNTER — Ambulatory Visit (INDEPENDENT_AMBULATORY_CARE_PROVIDER_SITE_OTHER): Payer: Medicare Other | Admitting: Cardiology

## 2021-02-19 ENCOUNTER — Other Ambulatory Visit: Payer: Self-pay

## 2021-02-19 ENCOUNTER — Encounter: Payer: Self-pay | Admitting: Cardiology

## 2021-02-19 VITALS — BP 118/69 | HR 55 | Ht <= 58 in | Wt 125.0 lb

## 2021-02-19 DIAGNOSIS — E782 Mixed hyperlipidemia: Secondary | ICD-10-CM

## 2021-02-19 DIAGNOSIS — I480 Paroxysmal atrial fibrillation: Secondary | ICD-10-CM

## 2021-02-19 DIAGNOSIS — G4733 Obstructive sleep apnea (adult) (pediatric): Secondary | ICD-10-CM

## 2021-02-19 NOTE — Patient Instructions (Signed)
Medication Instructions:  °No changes ° ° °*If you need a refill on your cardiac medications before your next appointment, please call your pharmacy* ° ° °Lab Work: ° °Not needed ° ° °Testing/Procedures: °Not needed ° ° °Follow-Up: °At [facility], you and your health needs are our priority.  As part of our continuing mission to provide you with exceptional heart care, we have created designated Provider Care Teams.  These Care Teams include your primary Cardiologist (physician) and Advanced Practice Providers (APPs -  Physician Assistants and Nurse Practitioners) who all work together to provide you with the care you need, when you need it. ° °  ° °Your next appointment:   °6 month(s) ° °The format for your next appointment:   °In Person ° °Provider:   °Peter Jordan, MD  ° ° ° °

## 2021-02-20 ENCOUNTER — Telehealth: Payer: Self-pay | Admitting: Family Medicine

## 2021-02-20 NOTE — Telephone Encounter (Signed)
Pt called and states that she is negative for COVID. Pt can be reached at [phone].

## 2021-03-05 ENCOUNTER — Ambulatory Visit: Payer: Medicare Other | Admitting: Cardiology

## 2021-03-05 ENCOUNTER — Other Ambulatory Visit: Payer: Self-pay | Admitting: Family Medicine

## 2021-03-05 DIAGNOSIS — Z1231 Encounter for screening mammogram for malignant neoplasm of breast: Secondary | ICD-10-CM

## 2021-03-16 ENCOUNTER — Other Ambulatory Visit: Payer: Self-pay | Admitting: Family Medicine

## 2021-03-16 DIAGNOSIS — E782 Mixed hyperlipidemia: Secondary | ICD-10-CM

## 2021-03-16 DIAGNOSIS — E890 Postprocedural hypothyroidism: Secondary | ICD-10-CM

## 2021-03-18 ENCOUNTER — Ambulatory Visit (INDEPENDENT_AMBULATORY_CARE_PROVIDER_SITE_OTHER): Payer: Medicare Other | Admitting: Pulmonary Disease

## 2021-03-18 ENCOUNTER — Encounter: Payer: Self-pay | Admitting: Pulmonary Disease

## 2021-03-18 ENCOUNTER — Other Ambulatory Visit: Payer: Self-pay

## 2021-03-18 VITALS — BP 118/68 | HR 60 | Temp 97.8°F | Ht <= 58 in | Wt 122.2 lb

## 2021-03-18 DIAGNOSIS — Z9989 Dependence on other enabling machines and devices: Secondary | ICD-10-CM | POA: Diagnosis not present

## 2021-03-18 DIAGNOSIS — J3489 Other specified disorders of nose and nasal sinuses: Secondary | ICD-10-CM

## 2021-03-18 DIAGNOSIS — G4733 Obstructive sleep apnea (adult) (pediatric): Secondary | ICD-10-CM

## 2021-03-18 NOTE — Progress Notes (Signed)
Abigail Day, Abigail Day, Abigail Day  Chief Complaint  Patient presents with   Follow-up    CPAP follow up. Pt states her nose gets cold is the only thing she is having issues with her cpap.     Past Surgical History:  She  has a past surgical history that includes Abdominal hysterectomy (age 88); Eye surgery; Colonoscopy (11/06, 09/25/10, 12/2015); Laparoscopic Nissen fundoplication ([id number]); Esophagogastroduodenoscopy (09/25/10); Abigail Laryngoscopy (11/24/2017).  Past Medical History:  Hypothyroidism, HLD, PAF, Osteopenia, Colon polyp, Diverticulosis  Constitutional:  BP 118/68 (BP Location: Left Arm, Patient Position: Sitting, Cuff Size: Normal)    Pulse 60    Temp 97.8 F (36.6 C) (Oral)    Ht 4\' 10"  (1.473 m)    Wt 122 lb 3.2 oz (55.4 kg)    SpO2 97%    BMI 25.54 kg/m   Brief Summary:  Abigail Day is a 88 y.o. female with obstructive sleep apnea.      Subjective:   I last saw her in December 2021.  She is here with her husband.  She got a new CPAP machine.  Using nasal pillows. Mask leak when straps start to wear out Abigail get stretchy.  Has nasal congestion Abigail nose feels cold in the morning.  Her humidifier runs out during the night.  Physical Exam:   Appearance - well kempt   ENMT - no sinus tenderness, no oral exudate, no LAN, Mallampati 3 airway, no stridor, raspy voice, wears dentures  Respiratory - equal breath sounds bilaterally, no wheezing or rales  CV - s1s2 regular rate Abigail rhythm, no murmurs  Ext - no clubbing, no edema  Skin - no rashes  Psych - normal mood Abigail affect   Sleep Tests:  PSG 08/18/12 >> AHI 23, SpO2 low 81% Auto CPAP 02/08/20 to 03/08/20 >> used on 29 of 30 nights with average 6 hrs 54 min.  Average AHI 3.2 with mean CPAP 7 Abigail 90 th percentile CPAP 9 cm H2O.  Cardiac Tests:  Echo 09/23/17 >> EF 60 to 65%, grade 1 DD, mild TR  Social History:  She  reports that she has never smoked. She has never used smokeless tobacco.  She reports current alcohol use. She reports that she does not use drugs.  Family History:  Her family history includes Aortic aneurysm in her sister; Breast cancer (age of onset: 52) in her sister; Colon cancer (age of onset: 46) in her sister; Diabetes in her brother, brother, Abigail father; Heart disease in her brother, brother, brother, brother, brother, brother, brother, father, mother, sister, sister, sister, sister, sister, Abigail sister; Hyperlipidemia in her sister; Marfan syndrome in her sister; Peripheral Artery Disease in her sister; Stroke in her sister.     Assessment/Plan:   Obstructive sleep apnea. - she reports compliance with CPAP Abigail benefit from therapy - she uses [employer] for her DME - continue auto CPAP 5 to 12 cm H2O - will call her with download results   CPAP rhinitis with dry nose. - continue Ayr - flonase caused nose bleeds - she can try adjusting temperature setting down on CPAP humidifier    Hoarseness likely from reflux. - seen by Dr. Benjamine Mola ENT Abigail speech therapy at [facility]   Paroxysmal atrial fibrillation. - followed by Dr. [place] with cardiology  Time Spent Involved in Patient Day on Day of Examination:  36 minutes  Follow up:   Patient Instructions  Will call you with results of your CPAP download  You can try adjusting the humidifier setting down on your CPAP machine  Follow up in 1 year  Medication List:   Allergies as of 03/18/2021   No Known Allergies      Medication List        Accurate as of March 18, 2021  9:43 AM. If you have any questions, ask your nurse or doctor.          CALCIUM 600 + D PO Take 2 tablets by mouth daily.   cholestyramine 4 g packet Commonly known as: Questran Take 1 packet (4 g total) by mouth 3 (three) times daily with meals.   co-enzyme Q-10 30 MG capsule Take 30 mg by mouth daily.   Eliquis 2.5 MG Tabs tablet Generic drug: apixaban Take 1 tablet by mouth twice daily   FISH OIL OMEGA-3 PO Take  3,600 mg by mouth daily.   ICAPS PO Take 1 tablet by mouth daily.   loperamide 2 MG tablet Commonly known as: IMODIUM A-D Take 2 mg by mouth 4 (four) times daily as needed for diarrhea or loose stools.   loratadine 10 MG tablet Commonly known as: CLARITIN Take 10 mg by mouth daily.   metoprolol tartrate 25 MG tablet Commonly known as: LOPRESSOR Take 1 tablet (25 mg total) by mouth 2 (two) times daily.   Multi-Vitamin tablet Take 1 tablet by mouth daily.   OSTEO BI-FLEX JOINT SHIELD PO Take 2 tablets by mouth daily.   PreviDent 5000 Plus 1.1 % Crea dental cream Generic drug: sodium fluoride See admin instructions.   PROBIOTIC PO Take 1 capsule by mouth daily.   rosuvastatin 20 MG tablet Commonly known as: CRESTOR Take 1 tablet (20 mg total) by mouth daily.   Synthroid 50 MCG tablet Generic drug: levothyroxine TAKE 1 TABLET BY MOUTH BEFORE BREAKFAST FOR  5  DAYS  PER  WEEK.  TAKE  2  TABLETS  ON  SATURDAY  Abigail  SUNDAY        Signature:  Chesley Mires, MD [place] Pager - [phone] 03/18/2021, 9:43 AM

## 2021-03-18 NOTE — Patient Instructions (Signed)
Will call you with results of your CPAP download  You can try adjusting the humidifier setting down on your CPAP machine  Follow up in 1 year

## 2021-03-21 ENCOUNTER — Telehealth: Payer: Self-pay | Admitting: Pulmonary Disease

## 2021-03-21 NOTE — Telephone Encounter (Signed)
Found a copy of her download in VS' review folder. Called patient but she did not answer. Left message to let her know that we have received the download but VS has been unable to review it. We will call her once he has.   Will close encounter.

## 2021-03-21 NOTE — Telephone Encounter (Signed)
Called patient but she did not answer. Left message for her to call back. I checked [employer] to see if her cpap information had been updated, it has not. Will check Dr. Evlyn Courier papers to see if it has been faxed to our office.

## 2021-03-25 ENCOUNTER — Other Ambulatory Visit: Payer: Self-pay

## 2021-03-25 ENCOUNTER — Other Ambulatory Visit: Payer: Medicare Other

## 2021-03-25 ENCOUNTER — Telehealth: Payer: Self-pay | Admitting: Pulmonary Disease

## 2021-03-25 DIAGNOSIS — Z7901 Long term (current) use of anticoagulants: Secondary | ICD-10-CM | POA: Diagnosis not present

## 2021-03-25 DIAGNOSIS — E890 Postprocedural hypothyroidism: Secondary | ICD-10-CM

## 2021-03-25 DIAGNOSIS — E782 Mixed hyperlipidemia: Secondary | ICD-10-CM | POA: Diagnosis not present

## 2021-03-25 DIAGNOSIS — Z5181 Encounter for therapeutic drug level monitoring: Secondary | ICD-10-CM | POA: Diagnosis not present

## 2021-03-25 NOTE — Telephone Encounter (Signed)
CPAP 12/18/20 to 03/17/21 >> used on 85 of 90 nights with average 6 hrs 54 min.  Average AHI 3.4 with average CPAP 6 and 90% CPAP 9 cm H2O.   Please let her know her sleep apnea is well controlled with current CPAP settings.

## 2021-03-25 NOTE — Telephone Encounter (Signed)
Spoke with patient regarding CPAP DL report.   They verbalized understanding. No further questions.  Nothing further needed at this time.

## 2021-03-26 LAB — CBC WITH DIFFERENTIAL/PLATELET
Basophils Absolute: 0 10*3/uL (ref 0.0–0.2)
Basos: 1 %
EOS (ABSOLUTE): 0.1 10*3/uL (ref 0.0–0.4)
Eos: 3 %
Hematocrit: 36.4 % (ref 34.0–46.6)
Hemoglobin: 12.7 g/dL (ref 11.1–15.9)
Immature Grans (Abs): 0 10*3/uL (ref 0.0–0.1)
Immature Granulocytes: 0 %
Lymphocytes Absolute: 0.9 10*3/uL (ref 0.7–3.1)
Lymphs: 23 %
MCH: 33.9 pg — ABNORMAL HIGH (ref 26.6–33.0)
MCHC: 34.9 g/dL (ref 31.5–35.7)
MCV: 97 fL (ref 79–97)
Monocytes Absolute: 0.5 10*3/uL (ref 0.1–0.9)
Monocytes: 13 %
Neutrophils Absolute: 2.3 10*3/uL (ref 1.4–7.0)
Neutrophils: 60 %
Platelets: 212 10*3/uL (ref 150–450)
RBC: 3.75 x10E6/uL — ABNORMAL LOW (ref 3.77–5.28)
RDW: 11.8 % (ref 11.7–15.4)
WBC: 3.8 10*3/uL (ref 3.4–10.8)

## 2021-03-26 LAB — COMPREHENSIVE METABOLIC PANEL
ALT: 20 IU/L (ref 0–32)
AST: 21 IU/L (ref 0–40)
Albumin/Globulin Ratio: 1.9 (ref 1.2–2.2)
Albumin: 4.2 g/dL (ref 3.6–4.6)
Alkaline Phosphatase: 65 IU/L (ref 44–121)
BUN/Creatinine Ratio: 14 (ref 12–28)
BUN: 12 mg/dL (ref 8–27)
Bilirubin Total: 0.2 mg/dL (ref 0.0–1.2)
CO2: 24 mmol/L (ref 20–29)
Calcium: 9.1 mg/dL (ref 8.7–10.3)
Chloride: 101 mmol/L (ref 96–106)
Creatinine, Ser: 0.83 mg/dL (ref 0.57–1.00)
Globulin, Total: 2.2 g/dL (ref 1.5–4.5)
Glucose: 89 mg/dL (ref 70–99)
Potassium: 4.2 mmol/L (ref 3.5–5.2)
Sodium: 138 mmol/L (ref 134–144)
Total Protein: 6.4 g/dL (ref 6.0–8.5)
eGFR: 69 mL/min/{1.73_m2} (ref >59)

## 2021-03-26 LAB — LIPID PANEL
Chol/HDL Ratio: 2.6 ratio (ref 0.0–4.4)
Cholesterol, Total: 139 mg/dL (ref 100–199)
HDL: 53 mg/dL (ref >39)
LDL Chol Calc (NIH): 66 mg/dL (ref 0–99)
Triglycerides: 113 mg/dL (ref 0–149)
VLDL Cholesterol Cal: 20 mg/dL (ref 5–40)

## 2021-03-26 LAB — TSH: TSH: 2.7 u[IU]/mL (ref 0.450–4.500)

## 2021-03-26 NOTE — Progress Notes (Signed)
Chief Complaint  Patient presents with   Hypothyroidism    Med check, labs already done. Right foot hurts, mostly at night. She takes probiotic in the am but is having some issues at night after dinner, wants to know if it would be okay to take the probiotic at night instead and see if that helps.    Patient presents for 6 month follow-up on chronic problems. See below for labs done prior to visit.  She is complaining of some pain in the bottom of her feet, R>L for the last 6 months. She uses a cream at night that helps a lot. She denies change in shoes, activity. Denies trauma, swelling.  She was last seen here in November, with complaints of diarrhea.  She takes a probiotic every morning.  She still intermittently has diarrhea after dinner, not every meal.  Salads trigger it, or whenever there is a gravy or "thickening".  She does have creamy dressing (Caesar). No BRB or black stools, no abdominal pain.  Hypothyroidism:  She takes her medication on an empty stomach, separate from food and other medications.  She is compliant with taking Synthroid. Current dose is 50 mcg tablet, 1 pill Monday through Friday, and 2 pills on Sat and Sunday. TSH was normal (1.97) on this regimen in 11/2020.  Previously TSH was suppressed on 57mcg, and under-replaced on 8mcg dose.  Denies changes to hair/skin/energy/bowels/weight/moods. Skin is a little drier (usually is in winter), hair has less body, but not losing hair, no thinning. Energy is good, not as tired in the afternoons, though sometimes still takes a nap in the afternoon.   Mixed hyperlipidemia:  She is compliant with taking Crestor, and denies side effects (previously took pravastatin which wasn't getting her to goal for TG; previously didn't tolerate lipitor) She continues on fish oil (2/d),  and tries to follow lowfat, low cholesterol diet. She is also taking Coenzyme Q10 daily.     Atrial fibrillation:  She remains on Eliqis without any bleeding or  complications, and is on metoprolol for rate control.  She sees Dr. [place] every 6 months, last seen in December.  She can't always tell when she is in afib.  Some mornings she notes some palpitations, short-lived. No associated SOB or CP.  Only has DOE with 3 flights of stairs.   HTN:  Compliant with medications.  Denies headaches, dizziness, chest pain, shortness of breath.  BP Readings from Last 3 Encounters:  03/27/21 110/62  03/18/21 118/68  02/19/21 118/69    OSA:  She is using CPAP, and doing well, under the care of Dr. Halford Chessman, last seen earlier this month. She got a new machine, and got a good reports. She continues to feel more refreshed since being on CPAP, though some days aren't as good as others (sometimes the mask bothers her at night, sleep is interrupted).   Osteopenia--She took [place] through the end of June 2021 (had taken off/on, for total of about 5 years) Last DEXA was in 12/2020, stable compared to prior study, with T-1.6 at L femoral neck. She takes Calcium and D. Over the summer, she walked more, didn't go to the gym as much.  Now has been going to the gym 3x/week, and uses weights.  LBP--radiates up under the R shoulder blade. She has seen Dr. Rolena Infante, was referred for PT, and ha also seen Dr. Nelva Bush. Got benefit from PT. She is doing home exercises, and chair yoga, which has helped. Also has h/o hip bursitis,  periodically gets injections (last was from ortho).  She has some intermittent pain at her R bursa.   PMH, PSH, SH reviewed  Outpatient Encounter Medications as of 03/27/2021  Medication Sig Note   apixaban (ELIQUIS) 2.5 MG TABS tablet Take 1 tablet by mouth twice daily    Calcium Carbonate-Vitamin D (CALCIUM 600 + D PO) Take 2 tablets by mouth daily.    co-enzyme Q-10 30 MG capsule Take 30 mg by mouth daily.    loratadine (CLARITIN) 10 MG tablet Take 10 mg by mouth daily.    metoprolol tartrate (LOPRESSOR) 25 MG tablet Take 1 tablet (25 mg total) by mouth 2 (two)  times daily.    Misc Natural Products (OSTEO BI-FLEX JOINT SHIELD PO) Take 2 tablets by mouth daily.    Multiple Vitamin (MULTI-VITAMIN) tablet Take 1 tablet by mouth daily.    Multiple Vitamins-Minerals (ICAPS PO) Take 1 tablet by mouth daily.    Omega-3 Fatty Acids (FISH OIL OMEGA-3 PO) Take 3,600 mg by mouth daily.  03/07/2019: Takes 2/day (got higher dose per pt, so cut back from 3)   PREVIDENT 5000 PLUS 1.1 % CREA dental cream See admin instructions.    Probiotic Product (PROBIOTIC PO) Take 1 capsule by mouth daily.    rosuvastatin (CRESTOR) 20 MG tablet Take 1 tablet (20 mg total) by mouth daily.    SYNTHROID 50 MCG tablet TAKE 1 TABLET BY MOUTH BEFORE BREAKFAST FOR  5  DAYS  PER  WEEK.  TAKE  2  TABLETS  ON  SATURDAY  AND  SUNDAY    cholestyramine (QUESTRAN) 4 g packet Take 1 packet (4 g total) by mouth 3 (three) times daily with meals. (Patient not taking: Reported on 03/27/2021) 03/27/2021: Hasn't been using.  It helped just a little.  Probiotic seems to help more   loperamide (IMODIUM A-D) 2 MG tablet Take 2 mg by mouth 4 (four) times daily as needed for diarrhea or loose stools. (Patient not taking: Reported on 03/27/2021)    No facility-administered encounter medications on file as of 03/27/2021.   No Known Allergies  ROS: No fever, chills, URI symptoms, cough, shortness of breath. No chest pain, shortness of breath. Occasional palpitations in the morning, per HPI. No nausea or vomiting.  Gets occasional diarrhea after dinner, depending on what she eats. Leg pain improved with use of compression stockings. Legs feel better, but the bottom of her feet hurt. Denies any heartburn, dysphagia.  No dysuria or hematuria. Moods are good. Back pain, improved with exercises. Foot pain per HPI.   PHYSICAL EXAM:  BP 110/62    Pulse 80    Ht 4\' 10"  (1.473 m)    Wt 124 lb 3.2 oz (56.3 kg)    BMI 25.96 kg/m   Wt Readings from Last 3 Encounters:  03/27/21 124 lb 3.2 oz (56.3 kg)  03/18/21 122  lb 3.2 oz (55.4 kg)  02/19/21 125 lb (56.7 kg)   Well-appearing, pleasant elderly female in no distress HEENT: conjunctiva and sclera are clear, EOMI, wearing mask Neck: no lymphadenopathy or mass, no bruit Heart: regular rate and rhythm, no murmur Lungs: clear bilaterally Abdomen: Soft, nontender, no organomegaly or mass. Back: no spinal or CVA tenderness Extremities: no edema. Mildly tender along her R arch.  Nontender at calcaneous. Normal pulses. Neuro: alert and oriented. Normal strength, gait Psych: normal mood, affect, hygiene and grooming       Chemistry      Component Value Date/Time   NA  138 03/25/2021 0831   K 4.2 03/25/2021 0831   CL 101 03/25/2021 0831   CO2 24 03/25/2021 0831   BUN 12 03/25/2021 0831   CREATININE 0.83 03/25/2021 0831   CREATININE 0.83 04/21/2016 0852      Component Value Date/Time   CALCIUM 9.1 03/25/2021 0831   ALKPHOS 65 03/25/2021 0831   AST 21 03/25/2021 0831   ALT 20 03/25/2021 0831   BILITOT 0.2 03/25/2021 0831     Fasting glucose 89  Lab Results  Component Value Date   WBC 3.8 03/25/2021   HGB 12.7 03/25/2021   HCT 36.4 03/25/2021   MCV 97 03/25/2021   PLT 212 03/25/2021   Lab Results  Component Value Date   TSH 2.700 03/25/2021   Lab Results  Component Value Date   CHOL 139 03/25/2021   HDL 53 03/25/2021   LDLCALC 66 03/25/2021   TRIG 113 03/25/2021   CHOLHDL 2.6 03/25/2021      ASSESSMENT/PLAN:  Mixed hyperlipidemia - lipids at goal. Cont Crestor and lowfat, low cholesterol diet - Plan: rosuvastatin (CRESTOR) 20 MG tablet  Hypothyroidism, unspecified type - adequately replaced on current dose, continue - Plan: SYNTHROID 50 MCG tablet  Paroxysmal atrial fibrillation (HCC) - in NSR today, some intermittent palpitations. Cont anticoagulant, BB  Osteopenia of neck of left femur - stable per last DEXA. Cont Ca, D and weight-bearing exercise  Obstructive sleep apnea - cont CPAP  Long term (current) use of  anticoagulants - no e/o complication or bleeding.  Right foot pain - exam not c/w PF, just discomfort along arch. Discussed arch supports, stretches/massage    F/u AWV/med check in 6 mos . Labs aren't fasting, so will do at her visit, rather than prior. Will need CBC, TSH

## 2021-03-27 ENCOUNTER — Ambulatory Visit (INDEPENDENT_AMBULATORY_CARE_PROVIDER_SITE_OTHER): Payer: Medicare Other | Admitting: Family Medicine

## 2021-03-27 ENCOUNTER — Encounter: Payer: Self-pay | Admitting: Family Medicine

## 2021-03-27 ENCOUNTER — Other Ambulatory Visit: Payer: Self-pay

## 2021-03-27 VITALS — BP 110/62 | HR 80 | Ht <= 58 in | Wt 124.2 lb

## 2021-03-27 DIAGNOSIS — E782 Mixed hyperlipidemia: Secondary | ICD-10-CM

## 2021-03-27 DIAGNOSIS — G4733 Obstructive sleep apnea (adult) (pediatric): Secondary | ICD-10-CM

## 2021-03-27 DIAGNOSIS — M79671 Pain in right foot: Secondary | ICD-10-CM

## 2021-03-27 DIAGNOSIS — Z7901 Long term (current) use of anticoagulants: Secondary | ICD-10-CM | POA: Diagnosis not present

## 2021-03-27 DIAGNOSIS — E039 Hypothyroidism, unspecified: Secondary | ICD-10-CM

## 2021-03-27 DIAGNOSIS — M85852 Other specified disorders of bone density and structure, left thigh: Secondary | ICD-10-CM

## 2021-03-27 DIAGNOSIS — I480 Paroxysmal atrial fibrillation: Secondary | ICD-10-CM | POA: Diagnosis not present

## 2021-03-27 MED ORDER — ROSUVASTATIN CALCIUM 20 MG PO TABS: 20.0000 mg | ORAL_TABLET | Freq: Every day | ORAL | 3 refills | Status: DC

## 2021-03-27 MED ORDER — SYNTHROID 50 MCG PO TABS: ORAL_TABLET | ORAL | 0 refills | Status: DC

## 2021-03-27 NOTE — Patient Instructions (Addendum)
Try and keep a food diary to help you figure out which foods are triggering your diarrhea (whether it is just tomatoes vs the whole salad, which sauces). Definitely continue to avoid dairy, fried/greasy/fatty foods. Avoid butter, creamy dressings (use [place] or vinaigrette instead), alfredo sauces, creamy soups, ice cream.  Use Lactaid prior to eating any dairy, or before dinner, since that is the meal that triggers your diarrhea most.  Please be sure to use weights twice a week even when the weather is nicer and you're walking more outside. This is good for your bones.  I would wear more supportive shoes (get new arch supports for your shoes). Continue to massage and stretch them. Consider using a frozen water bottle to roll your foot in the evenings when they are sore.

## 2021-04-01 DIAGNOSIS — Z20822 Contact with and (suspected) exposure to covid-19: Secondary | ICD-10-CM | POA: Diagnosis not present

## 2021-04-11 ENCOUNTER — Ambulatory Visit: Payer: Medicare Other | Admitting: Cardiology

## 2021-04-11 ENCOUNTER — Ambulatory Visit
Admission: RE | Admit: 2021-04-11 | Discharge: 2021-04-11 | Disposition: A | Discharge status: Home / Self Care | Payer: Medicare Other | Source: Ambulatory Visit | Attending: Family Medicine | Admitting: Family Medicine

## 2021-04-11 DIAGNOSIS — Z1231 Encounter for screening mammogram for malignant neoplasm of breast: Secondary | ICD-10-CM | POA: Diagnosis not present

## 2021-04-15 ENCOUNTER — Other Ambulatory Visit: Payer: Self-pay | Admitting: Cardiology

## 2021-05-29 DIAGNOSIS — Z961 Presence of intraocular lens: Secondary | ICD-10-CM | POA: Diagnosis not present

## 2021-05-29 DIAGNOSIS — H35371 Puckering of macula, right eye: Secondary | ICD-10-CM | POA: Diagnosis not present

## 2021-05-29 DIAGNOSIS — H40012 Open angle with borderline findings, low risk, left eye: Secondary | ICD-10-CM | POA: Diagnosis not present

## 2021-05-29 DIAGNOSIS — H353131 Nonexudative age-related macular degeneration, bilateral, early dry stage: Secondary | ICD-10-CM | POA: Diagnosis not present

## 2021-05-29 DIAGNOSIS — H04123 Dry eye syndrome of bilateral lacrimal glands: Secondary | ICD-10-CM | POA: Diagnosis not present

## 2021-05-29 DIAGNOSIS — H31091 Other chorioretinal scars, right eye: Secondary | ICD-10-CM | POA: Diagnosis not present

## 2021-05-29 DIAGNOSIS — H02831 Dermatochalasis of right upper eyelid: Secondary | ICD-10-CM | POA: Diagnosis not present

## 2021-05-29 DIAGNOSIS — H401111 Primary open-angle glaucoma, right eye, mild stage: Secondary | ICD-10-CM | POA: Diagnosis not present

## 2021-05-29 DIAGNOSIS — Z20822 Contact with and (suspected) exposure to covid-19: Secondary | ICD-10-CM | POA: Diagnosis not present

## 2021-05-29 DIAGNOSIS — H02834 Dermatochalasis of left upper eyelid: Secondary | ICD-10-CM | POA: Diagnosis not present

## 2021-05-29 DIAGNOSIS — H43811 Vitreous degeneration, right eye: Secondary | ICD-10-CM | POA: Diagnosis not present

## 2021-06-20 DIAGNOSIS — Z20822 Contact with and (suspected) exposure to covid-19: Secondary | ICD-10-CM | POA: Diagnosis not present

## 2021-06-21 DIAGNOSIS — Z20822 Contact with and (suspected) exposure to covid-19: Secondary | ICD-10-CM | POA: Diagnosis not present

## 2021-06-22 ENCOUNTER — Other Ambulatory Visit: Payer: Self-pay | Admitting: Family Medicine

## 2021-06-22 ENCOUNTER — Other Ambulatory Visit: Payer: Self-pay | Admitting: Cardiology

## 2021-06-22 DIAGNOSIS — E039 Hypothyroidism, unspecified: Secondary | ICD-10-CM

## 2021-06-24 NOTE — Telephone Encounter (Signed)
Prescription refill request for Eliquis received. ?Indication:Afib ?Last office visit:12/22 ?Scr:0.8 ?Age: 88 ?Weight:56.3 kg ? ?Prescription refilled ? ?

## 2021-07-01 DIAGNOSIS — Z20822 Contact with and (suspected) exposure to covid-19: Secondary | ICD-10-CM | POA: Diagnosis not present

## 2021-07-15 ENCOUNTER — Telehealth: Payer: Self-pay | Admitting: Family Medicine

## 2021-07-15 NOTE — Telephone Encounter (Signed)
Pt called she [facility] and she wants to talk to you about what they told her ?

## 2021-07-15 NOTE — Telephone Encounter (Signed)
Spoke with patient and she saw the nurse there and was told to take mucinex for her chest congestion x 4 days. Nurse told her she heard some congestion in her left lung. No fever. The mucinex is breaking up the congestion. This began with a ST. I asked her to please take one of the covid tests she has at home and she is not better to please call and schedule appt with Dr. Lynelle Doctor. ?

## 2021-07-17 ENCOUNTER — Other Ambulatory Visit: Payer: Self-pay | Admitting: Cardiology

## 2021-08-25 NOTE — Progress Notes (Deleted)
Abigail Day Abigail Day: 02/05/1934 Medical Record #[id number]  History of Present Illness: [name] is seen for follow up of Afib. She has a history of paroxysmal atrial fib - on Eliquis. EF is normal per echo back in December of 2013. Normal Myoview in June 2015. Other issues include HLD, hypothyroidism with prior ablation for hyperthyroidism, and diverticulosis. She is  on CPAP therapy for sleep apnea-followed by pulmonary. In 2019 she had some scapular pain and dyspnea. We repeated an Echo and Myoview study which were unremarkable.  She is doing well in follow up. Notes some fatigue at times and improves with a nap. No palpitations now. No dyspnea.  She is on CPAP and doing well. Last TSH in normal range.     Current Outpatient Medications on File Prior to Visit  Medication Sig Dispense Refill   apixaban (ELIQUIS) 2.5 MG TABS tablet Take 1 tablet by mouth twice daily 180 tablet 1   Calcium Carbonate-Vitamin D (CALCIUM 600 + D PO) Take 2 tablets by mouth daily.     cholestyramine (QUESTRAN) 4 g packet Take 1 packet (4 g total) by mouth 3 (three) times daily with meals. (Patient not taking: Reported on 03/27/2021) 60 each 2   co-enzyme Q-10 30 MG capsule Take 30 mg by mouth daily.     loperamide (IMODIUM A-D) 2 MG tablet Take 2 mg by mouth 4 (four) times daily as needed for diarrhea or loose stools. (Patient not taking: Reported on 03/27/2021)     loratadine (CLARITIN) 10 MG tablet Take 10 mg by mouth daily.     metoprolol tartrate (LOPRESSOR) 25 MG tablet Take 1 tablet by mouth twice daily 180 tablet 0   Misc Natural Products (OSTEO BI-FLEX JOINT SHIELD PO) Take 2 tablets by mouth daily.     Multiple Vitamin (MULTI-VITAMIN) tablet Take 1 tablet by mouth daily.     Multiple Vitamins-Minerals (ICAPS PO) Take 1 tablet by mouth daily.     Omega-3 Fatty Acids (FISH OIL OMEGA-3 PO) Take 3,600 mg by mouth daily.      PREVIDENT 5000 PLUS 1.1 % CREA dental cream See admin instructions.  3    Probiotic Product (PROBIOTIC PO) Take 1 capsule by mouth daily.     rosuvastatin (CRESTOR) 20 MG tablet Take 1 tablet (20 mg total) by mouth daily. 90 tablet 3   SYNTHROID 50 MCG tablet TAKE 1 TABLET BY MOUTH ONCE DAILY BEFORE BREAKFAST 5  DAYS  PER  WEEK  .  TAKE  2  TABLETS  ON  SATURDAY  AND  SUNDAY 145 tablet 0   No current facility-administered medications on file prior to visit.    No Known Allergies  Past Medical History:  Diagnosis Date   Atrial fibrillation (HCC)    Diverticulosis    Dyspnea on exertion 02/18/2012   Eye exam abnormal 05/08/15   Dr.Groat-glaucoma suspect   Glaucoma suspect    Hemorrhoids    internal and external   Hemorrhoids 09/25/10   internal and external   Hyperplastic colon polyp 09/25/10   Dr. Ewing Schlein   Osteopenia    Palpitations 02/18/2012   Paroxysmal atrial fibrillation (HCC)    Pure hypercholesterolemia    Sleep apnea    Unspecified hypothyroidism    s/p ablation for hyperthyroidism   Urge urinary incontinence     Past Surgical History:  Procedure Laterality Date   ABDOMINAL HYSTERECTOMY  age 5   with BSO and bladder tack   COLONOSCOPY  11/06,  09/25/10, 12/2015   hyperplastic polyp 2012;    ESOPHAGOGASTRODUODENOSCOPY  09/25/10   tortuous esophagus, intact Nissen fundoplication, dilated the spasm at esophageal GE junction   EYE SURGERY     bilateral cataract surgery   LAPAROSCOPIC NISSEN FUNDOPLICATION  8/07   Dr. Daphine Deutscher   LARYNGOSCOPY  11/24/2017   dx.GERD,start omeprazole    Social History   Tobacco Use  Smoking Status Never  Smokeless Tobacco Never    Social History   Substance and Sexual Activity  Alcohol Use Yes   Alcohol/week: 0.0 standard drinks of alcohol   Comment: once a month or less    Family History  Problem Relation Age of Onset   Heart disease Mother    Heart disease Father    Diabetes Father    Marfan syndrome Sister    Heart disease Sister        valve replacement   Heart disease Sister         pacemaker   Aortic aneurysm Sister    Peripheral Artery Disease Sister        amputation of toes   Breast cancer Sister 61   Heart disease Sister        CABG at 42   Colon cancer Sister 75   Heart disease Sister        CABG   Heart disease Sister    Heart disease Sister    Hyperlipidemia Sister    Stroke Sister        in 1948, mild   Heart disease Brother    Diabetes Brother    Heart disease Brother    Diabetes Brother    Heart disease Brother    Heart disease Brother    Heart disease Brother        CABG   Heart disease Brother        CABG   Heart disease Brother        stent    Review of Systems: The review of systems is per the HPI.  All other systems were reviewed and are negative.  Physical Exam: There were no vitals taken for this visit. GENERAL:  [name] in NAD HEENT:  PERRL, EOMI, sclera are clear. Oropharynx is clear. NECK:  No jugular venous distention, carotid upstroke brisk and symmetric, no bruits, no thyromegaly or adenopathy LUNGS:  Clear to auscultation bilaterally CHEST:  Unremarkable HEART:  RRR,  PMI not displaced or sustained,S1 and S2 within normal limits, no S3, no S4: no clicks, no rubs, no murmurs ABD:  Soft, nontender. BS +, no masses or bruits. No hepatomegaly, no splenomegaly EXT:  2 + pulses throughout, no edema, no cyanosis no clubbing SKIN:  Warm and dry.  No rashes NEURO:  Alert and oriented x 3. Cranial nerves II through XII intact. PSYCH:  Cognitively intact    LABORATORY DATA:     Lab Results  Component Value Date   WBC 3.8 03/25/2021   HGB 12.7 03/25/2021   HCT 36.4 03/25/2021   PLT 212 03/25/2021   GLUCOSE 89 03/25/2021   CHOL 139 03/25/2021   TRIG 113 03/25/2021   HDL 53 03/25/2021   LDLCALC 66 03/25/2021   ALT 20 03/25/2021   AST 21 03/25/2021   NA 138 03/25/2021   K 4.2 03/25/2021   CL 101 03/25/2021   CREATININE 0.83 03/25/2021   BUN 12 03/25/2021   CO2 24 03/25/2021   TSH 2.700  03/25/2021   HGBA1C 5.4 09/24/2015  Echo 09/23/17: Study Conclusions   - Left ventricle: The cavity size was normal. Wall thickness was   normal. Systolic function was normal. The estimated ejection   fraction was in the range of 60% to 65%. Doppler parameters are   consistent with abnormal left ventricular relaxation (grade 1   diastolic dysfunction). The Ee&' ratio is between 8-15, suggesting   indeterminate LV filling pressure. - Left atrium: The atrium was normal in size. - Tricuspid valve: There was mild regurgitation. - Pulmonary arteries: PA peak pressure: 27 mm Hg (S). - Inferior vena cava: The vessel was normal in size. The   respirophasic diameter changes were in the normal range (>= 50%),   consistent with normal central venous pressure.   Impressions:   - Compared to a prior study in 2013, the LVEF is unchanged. Left   and right atria measure normal in size.  Myoview 09/29/17: Study Highlights     Nuclear stress EF: 55%. No T wave inversion was noted during stress. There was no ST segment deviation noted during stress. The study is normal. This is a low risk study.   Normal perfusion. LVEF 55% with normal wall motion. This is a low risk study. No change compared to prior study.      Assessment / Plan:  1. Obstructive sleep apnea. On CPAP therapy  2. PAF - treated with rate control with metoprolol and Eliquis.  Now in NSR. Continue current therapy  3. Hypercholesterolemia. Most recent lipid panel showed excellent LDL of 67 on Crestor.  4. Hypothyroidism. TSH normal.   Follow up in 6 months.

## 2021-08-28 ENCOUNTER — Telehealth: Payer: Self-pay | Admitting: Cardiology

## 2021-08-28 ENCOUNTER — Ambulatory Visit: Payer: Medicare Other | Admitting: Cardiology

## 2021-08-28 NOTE — Telephone Encounter (Signed)
Spoke to patient appointment scheduled with Dr.Jordan 09/16/21 at 2:00 pm.

## 2021-08-28 NOTE — Telephone Encounter (Signed)
Called pt to reschedule appt for today.  Next available for Abigail Day is OCT.  Pt does not want to see a PA and stated she would like for me to check with the nurse because "Normally Dr does not want her to wait that long in between" .  She as me to send a message to nurse.     Best number [phone]

## 2021-09-10 NOTE — Progress Notes (Signed)
Abigail Day Date of Birth: June 18, 1933 Medical Record #[id number]  History of Present Illness: Abigail Day is seen for follow up of Afib. She has a history of paroxysmal atrial fib - on Eliquis. EF is normal per echo back in December of 2013. Normal Myoview in June 2015. Other issues include HLD, hypothyroidism with prior ablation for hyperthyroidism, and diverticulosis. She is  on CPAP therapy for sleep apnea-followed by pulmonary. In 2019 she had some scapular pain and dyspnea. We repeated an Echo and Myoview study which were unremarkable.  She is doing well in follow up. Notes some fatigue at times and improves with a nap. No palpitations now.  She is on CPAP and doing well. Last TSH in normal range.  She is trying to walk more but is limited by dyspnea and low back arthritis.    Current Outpatient Medications on File Prior to Visit  Medication Sig Dispense Refill   apixaban (ELIQUIS) 2.5 MG TABS tablet Take 1 tablet by mouth twice daily 180 tablet 1   Calcium Carbonate-Vitamin D (CALCIUM 600 + D PO) Take 2 tablets by mouth daily.     cholestyramine (QUESTRAN) 4 g packet Take 1 packet (4 g total) by mouth 3 (three) times daily with meals. 60 each 2   co-enzyme Q-10 30 MG capsule Take 30 mg by mouth daily.     loperamide (IMODIUM A-D) 2 MG tablet Take 2 mg by mouth 4 (four) times daily as needed for diarrhea or loose stools.     loratadine (CLARITIN) 10 MG tablet Take 10 mg by mouth daily.     Misc Natural Products (OSTEO BI-FLEX JOINT SHIELD PO) Take 2 tablets by mouth daily.     Multiple Vitamin (MULTI-VITAMIN) tablet Take 1 tablet by mouth daily.     Multiple Vitamins-Minerals (ICAPS PO) Take 1 tablet by mouth daily.     Omega-3 Fatty Acids (FISH OIL OMEGA-3 PO) Take 3,600 mg by mouth daily.      PREVIDENT 5000 PLUS 1.1 % CREA dental cream See admin instructions.  3   Probiotic Product (PROBIOTIC PO) Take 1 capsule by mouth daily.     rosuvastatin (CRESTOR) 20 MG tablet Take 1 tablet (20 mg  total) by mouth daily. 90 tablet 3   SYNTHROID 50 MCG tablet TAKE 1 TABLET BY MOUTH ONCE DAILY BEFORE BREAKFAST 5  DAYS  PER  WEEK  .  TAKE  2  TABLETS  ON  SATURDAY  AND  SUNDAY 145 tablet 0   No current facility-administered medications on file prior to visit.    No Known Allergies  Past Medical History:  Diagnosis Date   Atrial fibrillation (HCC)    Diverticulosis    Dyspnea on exertion 02/18/2012   Eye exam abnormal 05/08/15   Dr.Groat-glaucoma suspect   Glaucoma suspect    Hemorrhoids    internal and external   Hemorrhoids 09/25/10   internal and external   Hyperplastic colon polyp 09/25/10   Dr. Ewing Schlein   Osteopenia    Palpitations 02/18/2012   Paroxysmal atrial fibrillation (HCC)    Pure hypercholesterolemia    Sleep apnea    Unspecified hypothyroidism    s/p ablation for hyperthyroidism   Urge urinary incontinence     Past Surgical History:  Procedure Laterality Date   ABDOMINAL HYSTERECTOMY  age 63   with BSO and bladder tack   COLONOSCOPY  11/06, 09/25/10, 12/2015   hyperplastic polyp 2012;    ESOPHAGOGASTRODUODENOSCOPY  09/25/10   tortuous esophagus,  intact Nissen fundoplication, dilated the spasm at esophageal GE junction   EYE SURGERY     bilateral cataract surgery   LAPAROSCOPIC NISSEN FUNDOPLICATION  8/07   Dr. Daphine Deutscher   LARYNGOSCOPY  11/24/2017   dx.GERD,start omeprazole    Social History   Tobacco Use  Smoking Status Never  Smokeless Tobacco Never    Social History   Substance and Sexual Activity  Alcohol Use Yes   Alcohol/week: 0.0 standard drinks of alcohol   Comment: once a month or less    Family History  Problem Relation Age of Onset   Heart disease Mother    Heart disease Father    Diabetes Father    Marfan syndrome Sister    Heart disease Sister        valve replacement   Heart disease Sister        pacemaker   Aortic aneurysm Sister    Peripheral Artery Disease Sister        amputation of toes   Breast cancer Sister 108    Heart disease Sister        CABG at 18   Colon cancer Sister 44   Heart disease Sister        CABG   Heart disease Sister    Heart disease Sister    Hyperlipidemia Sister    Stroke Sister        in 1948, mild   Heart disease Brother    Diabetes Brother    Heart disease Brother    Diabetes Brother    Heart disease Brother    Heart disease Brother    Heart disease Brother        CABG   Heart disease Brother        CABG   Heart disease Brother        stent    Review of Systems: The review of systems is per the HPI.  All other systems were reviewed and are negative.  Physical Exam: BP 128/66   Pulse 62   Ht 4\' 10"  (1.473 m)   Wt 122 lb 3.2 oz (55.4 kg)   SpO2 95%   BMI 25.54 kg/m  GENERAL:  [name] in NAD HEENT:  PERRL, EOMI, sclera are clear. Oropharynx is clear. NECK:  No jugular venous distention, carotid upstroke brisk and symmetric, no bruits, no thyromegaly or adenopathy LUNGS:  Clear to auscultation bilaterally CHEST:  Unremarkable HEART:  RRR,  PMI not displaced or sustained,S1 and S2 within normal limits, no S3, no S4: no clicks, no rubs, no murmurs ABD:  Soft, nontender. BS +, no masses or bruits. No hepatomegaly, no splenomegaly EXT:  2 + pulses throughout, no edema, no cyanosis no clubbing SKIN:  Warm and dry.  No rashes NEURO:  Alert and oriented x 3. Cranial nerves II through XII intact. PSYCH:  Cognitively intact    LABORATORY DATA:     Lab Results  Component Value Date   WBC 3.8 03/25/2021   HGB 12.7 03/25/2021   HCT 36.4 03/25/2021   PLT 212 03/25/2021   GLUCOSE 89 03/25/2021   CHOL 139 03/25/2021   TRIG 113 03/25/2021   HDL 53 03/25/2021   LDLCALC 66 03/25/2021   ALT 20 03/25/2021   AST 21 03/25/2021   NA 138 03/25/2021   K 4.2 03/25/2021   CL 101 03/25/2021   CREATININE 0.83 03/25/2021   BUN 12 03/25/2021   CO2 24 03/25/2021   TSH 2.700 03/25/2021  HGBA1C 5.4 09/24/2015   Ecg today shows NSR rate 62. Low  voltage, nonspecific TWA. I have personally reviewed and interpreted this study.   Echo 09/23/17: Study Conclusions   - Left ventricle: The cavity size was normal. Wall thickness was   normal. Systolic function was normal. The estimated ejection   fraction was in the range of 60% to 65%. Doppler parameters are   consistent with abnormal left ventricular relaxation (grade 1   diastolic dysfunction). The Ee&' ratio is between 8-15, suggesting   indeterminate LV filling pressure. - Left atrium: The atrium was normal in size. - Tricuspid valve: There was mild regurgitation. - Pulmonary arteries: PA peak pressure: 27 mm Hg (S). - Inferior vena cava: The vessel was normal in size. The   respirophasic diameter changes were in the normal range (>= 50%),   consistent with normal central venous pressure.   Impressions:   - Compared to a prior study in 2013, the LVEF is unchanged. Left   and right atria measure normal in size.  Myoview 09/29/17: Study Highlights     Nuclear stress EF: 55%. No T wave inversion was noted during stress. There was no ST segment deviation noted during stress. The study is normal. This is a low risk study.   Normal perfusion. LVEF 55% with normal wall motion. This is a low risk study. No change compared to prior study.      Assessment / Plan:  1. Obstructive sleep apnea. On CPAP therapy  2. PAF - treated with rate control with metoprolol and Eliquis.  Now in NSR. Continue current therapy  3. Hypercholesterolemia. Most recent lipid panel showed excellent LDL of 66 on Crestor.  4. Hypothyroidism. TSH normal.   Follow up in 6 months.

## 2021-09-16 ENCOUNTER — Ambulatory Visit (INDEPENDENT_AMBULATORY_CARE_PROVIDER_SITE_OTHER): Payer: Medicare Other | Admitting: Cardiology

## 2021-09-16 ENCOUNTER — Encounter: Payer: Self-pay | Admitting: Cardiology

## 2021-09-16 VITALS — BP 128/66 | HR 62 | Ht <= 58 in | Wt 122.2 lb

## 2021-09-16 DIAGNOSIS — E782 Mixed hyperlipidemia: Secondary | ICD-10-CM | POA: Diagnosis not present

## 2021-09-16 DIAGNOSIS — I480 Paroxysmal atrial fibrillation: Secondary | ICD-10-CM | POA: Diagnosis not present

## 2021-09-16 DIAGNOSIS — G4733 Obstructive sleep apnea (adult) (pediatric): Secondary | ICD-10-CM | POA: Diagnosis not present

## 2021-09-16 MED ORDER — METOPROLOL TARTRATE 25 MG PO TABS: 25.0000 mg | ORAL_TABLET | Freq: Two times a day (BID) | ORAL | 3 refills | Status: DC

## 2021-10-02 ENCOUNTER — Telehealth: Payer: Self-pay | Admitting: Family Medicine

## 2021-10-02 ENCOUNTER — Other Ambulatory Visit: Payer: Self-pay | Admitting: *Deleted

## 2021-10-02 DIAGNOSIS — Z7901 Long term (current) use of anticoagulants: Secondary | ICD-10-CM

## 2021-10-02 DIAGNOSIS — E890 Postprocedural hypothyroidism: Secondary | ICD-10-CM

## 2021-10-02 NOTE — Telephone Encounter (Signed)
Pt called and wants to know if you want her to come in and have blood work done before her cpe on 08/*11/2021 esp her TSH

## 2021-10-02 NOTE — Telephone Encounter (Signed)
What we had said was that since she is only due to have a CBC and TSH done, which are nonfasting tests, we could draw those labs AT her visit. If she prefers to come prior, I can enter orders (she doesn't need to fast). Let me know.  I'm fine with her getting them at the visit, and she can either get the results from MyChart or phone call after.

## 2021-10-07 ENCOUNTER — Other Ambulatory Visit: Payer: Medicare Other

## 2021-10-07 DIAGNOSIS — Z7901 Long term (current) use of anticoagulants: Secondary | ICD-10-CM | POA: Diagnosis not present

## 2021-10-07 DIAGNOSIS — E890 Postprocedural hypothyroidism: Secondary | ICD-10-CM

## 2021-10-07 LAB — CBC WITH DIFFERENTIAL/PLATELET
Basophils Absolute: 0 10*3/uL (ref 0.0–0.2)
Basos: 1 %
EOS (ABSOLUTE): 0.1 10*3/uL (ref 0.0–0.4)
Eos: 4 %
Hematocrit: 35.4 % (ref 34.0–46.6)
Hemoglobin: 12.4 g/dL (ref 11.1–15.9)
Immature Grans (Abs): 0 10*3/uL (ref 0.0–0.1)
Immature Granulocytes: 0 %
Lymphocytes Absolute: 0.8 10*3/uL (ref 0.7–3.1)
Lymphs: 23 %
MCH: 34.5 pg — ABNORMAL HIGH (ref 26.6–33.0)
MCHC: 35 g/dL (ref 31.5–35.7)
MCV: 99 fL — ABNORMAL HIGH (ref 79–97)
Monocytes Absolute: 0.5 10*3/uL (ref 0.1–0.9)
Monocytes: 14 %
Neutrophils Absolute: 2.1 10*3/uL (ref 1.4–7.0)
Neutrophils: 58 %
Platelets: 213 10*3/uL (ref 150–450)
RBC: 3.59 x10E6/uL — ABNORMAL LOW (ref 3.77–5.28)
RDW: 11.9 % (ref 11.7–15.4)
WBC: 3.6 10*3/uL (ref 3.4–10.8)

## 2021-10-08 LAB — TSH: TSH: 2.15 u[IU]/mL (ref 0.450–4.500)

## 2021-10-08 NOTE — Progress Notes (Unsigned)
No chief complaint on file.   Abigail Day is a 88 y.o. female who presents for Medicare annual wellness visit and follow-up on chronic medical conditions.   She has the following concerns:  Hypothyroidism:  She takes her medication on an empty stomach, separate from food and other medications.  She is compliant with taking Synthroid. Current dose is 50 mcg tablet, 1 pill Monday through Friday, and 2 pills on Sat and Sunday. TSH was at goal in 11/2020 and 03/2021 on this regimen (TSH was suppressed on 48mg daily, and under-replaced on 528m daily). Denies changes to hair/skin/energy/bowels/weight/moods. Energy is good, sometimes still takes a nap in the afternoon.   Mixed hyperlipidemia:  She is compliant with taking Crestor, and denies side effects (previously took pravastatin which wasn't getting her to goal for TG; previously didn't tolerate lipitor) She continues on fish oil (2/d),  and tries to follow lowfat, low cholesterol diet. She is also taking Coenzyme Q10 daily.     Atrial fibrillation:  She remains on Eliqis without any bleeding or complications, and is on metoprolol for rate control.  She sees Dr. JoMartiniquevery 6 months, last seen in July.  She can't always tell when she is in afib.  Some mornings she notes some palpitations, short-lived. No associated SOB or CP.  Only has DOE with 3 flights of stairs.   HTN:  Compliant with medications.  Denies headaches, dizziness, chest pain, shortness of breath.  BP Readings from Last 3 Encounters:  09/16/21 128/66  03/27/21 110/62  03/18/21 118/68      OSA:  She is using CPAP, and doing well, under the care of Dr. SoHalford ChessmanShe continues to feel more refreshed since being on CPAP, though some days aren't as good as others (sometimes the mask bothers her at night, sleep is interrupted).   Osteopenia--She took Bo[place]hrough the end of June 2021 (had taken off/on, for total of about 5 years) Last DEXA was in 12/2020, stable compared to prior study,  with T-1.6 at L femoral neck. She takes Calcium and D. She goes to the gym 3x/week, and uses weights.   She has h/o back pain, which radiates up under the R shoulder blade. She has seen Dr. BrRolena InfanteDr. RaNelva Bushand has had physical therapy, which helped.  She is doing home exercises, and chair yoga, which has helped. Also has h/o hip bursitis, periodically gets injections from ortho.  She has some intermittent pain at her R bursa.   Allergies--She uses claritin and flonase with good results.    Hoarseness:  This is stable. She previously saw Dr. TeBenjamine Moladiagnosed with laryngopharyngeal reflux, and PPI was increased, but she didn't see any benefit to her symptoms. [facility]  She was told it was related to her thyroid treatment (from voice specialist), and was advised to stop the omeprazole entirely.  Since being off PPI, denies any recurrent reflux symptoms.  If she drinks a lot of water, her voice improves. She continues to do home exercises.    Immunization History  Administered Date(s) Administered   Fluad Quad(high Dose 65+) 12/29/2018, 11/24/2019, 12/18/2020   Influenza Split 01/08/2011, 01/08/2012   Influenza, High Dose Seasonal PF 12/08/2012, 12/19/2013, 12/21/2014, 12/13/2015, 10/23/2016, 12/14/2017   Moderna Covid-19 Vaccine Bivalent Booster 1833yr up 12/18/2020   Moderna SARS-COV2 Booster Vaccination 07/16/2020   Moderna Sars-Covid-2 Vaccination 03/07/2019, 04/04/2019, 01/10/2020   Pneumococcal Conjugate-13 03/27/2014   Pneumococcal Polysaccharide-23 01/31/2002, 01/08/2012   Td 10/01/2004  Tdap 01/08/2012   Zoster Recombinat (Shingrix) 07/07/2017, 10/18/2017   Zoster, Live 06/01/2008   Last Pap smear: n/a   Last mammogram: 04/2021 Last colonoscopy: 12/2015, negative biopsy.  No f/u needed Last DEXA:  12/2020 T-1.6 at L femoral neck. Ophtho: yearly Dentist: every 9 months   Exercise:  walking 30 minutes daily.  Goes to weight room  (bicycle and elliptical, and some weights) x 45 minutes 2-3x/week (more when too hot to walk).   Patient Care Team: Rita Ohara, MD as PCP - General (Family Medicine) Martinique, Peter M, MD as PCP - Cardiology (Cardiology)  Dermatologist: Dr. Allyson Sabal (retired) Ophtho: Dr. Katy Fitch  Dentist: Dr. Marshia Ly Pulmonary (for OSA): Dr. Halford Chessman GI: Dr. Watt Climes ENT: Dr. Benjamine Mola Voice (speech pathologist) [facility] Reno at [facility] Ortho: Dr. Rolena Infante Pain: Dr. Nelva Bush  Depression Screening: Flowsheet Row Office Visit from 09/19/2020 in [place]  PHQ-2 Total Score 0         Falls screen:     03/27/2021    1:40 PM 09/19/2020    1:39 PM 09/14/2019    1:58 PM 09/01/2018    2:32 PM 04/27/2017    8:32 AM  [place] in the past year? 0 0 0 0 No  Number falls in past yr: 0 0     Injury with Fall? 0 0     Risk for fall due to : No Fall Risks No Fall Risks     Follow up Falls evaluation completed Falls evaluation completed        Functional Status Survey:         Last year--Mini-Cog screen: 3/3 recall.  Incorrect time on clock (only 1 hand pointing to 2 for 2 o'clock, then added a second hand pointing to the 2 when asked about the other hand). Spacing is more spread out at first (1-5) and clumped some at end (6-12).  End of Life Discussion:  Patient has a living will and medical power of attorney, scanned   PMH, PSH, SH and FH reviewed and updated    ROS: The patient denies anorexia, fever, headaches, vision changes, decreased hearing, ear pain, sore throat, breast concerns, chest pain, syncope; denies cough, swelling, nausea, vomiting, constipation, abdominal pain, melena, hematochezia, indigestion/heartburn, dysphagia, vaginal bleeding, discharge, odor or itch, genital lesions, numbness, tingling, weakness, tremor, suspicious skin lesions, depression, anxiety, abnormal bleeding/bruising, or enlarged lymph nodes.   Some trouble remembering names, no changes. Hoarseness per HPI,  chronic/unchanged Numbness in feet at night.  Using topical creams help.  ?pain R>L feet? Diarrhea from certain foods--salad, gravy, "thickening" Doesn't always feel like she empties her bladder well.  Some leakage of urine, worse at night.  Denies dysuria, odor.   PHYSICAL EXAM:  There were no vitals taken for this visit.  Wt Readings from Last 3 Encounters:  09/16/21 122 lb 3.2 oz (55.4 kg)  03/27/21 124 lb 3.2 oz (56.3 kg)  03/18/21 122 lb 3.2 oz (55.4 kg)    General Appearance:    Alert, cooperative, no distress, appears stated age.  Voice is hoarse, unchanged from prior visits  Head:    Normocephalic, without obvious abnormality, atraumatic     Eyes:    PERRL, conjunctiva/corneas clear, EOM's intact, fundi benign     Ears:    Normal TM's and external ear canals     Nose:    Normal, no drainage or sinus tenderness  Throat:    Normal mucosa, no lesions  Neck:  Supple, no lymphadenopathy; thyroid: no enlargement/ tenderness/nodules; no carotid bruit or JVD     Back:    Spine nontender, no CVA tenderness. +kyphosis. Mildly tender at lumbar paraspinous muscles on the left.   Lungs:    Clear to auscultation bilaterally without wheezes, rales or ronchi; respirations unlabored     Chest Wall:    No tenderness or deformity     Heart:    Regular rate and rhythm, S1 and S2 normal, no murmur, rub or gallop.   Breast Exam:    No tenderness, masses, or nipple discharge or inversion. No axillary lymphadenopathy     Abdomen:    Soft, nondistended, nontender, normoactive bowel sounds, no hepatosplenomegaly or masses.   Genitalia:    Normal external genitalia without lesions. BUS and vagina normal, atrophic changes noted; No abnormal vaginal discharge. Uterus and adnexa surgically absent, no masses. Pap not performed     Rectal:    Normal tone, no masses or tenderness; guaiac negative stool     Extremities:    No clubbing, cyanosis or edema. 2+ pulses.  Pulses:    2+ and symmetric all  extremities     Skin:    Skin color, texture, turgor normal, no lesions or rash  Lymph nodes:    Cervical, supraclavicular, inguinal and axillary nodes normal     Neurologic:    CNII-XII intact, normal strength, sensation and gait; reflexes 2+ and symmetric throughout.  Alert and oriented.                      Psych:    Normal mood, affect, hygiene and grooming   ***UPDATE IF TENDER AT BACK  Lab Results  Component Value Date   TSH 2.150 10/07/2021   Lab Results  Component Value Date   WBC 3.6 10/07/2021   HGB 12.4 10/07/2021   HCT 35.4 10/07/2021   MCV 99 (H) 10/07/2021   PLT 213 10/07/2021    ASSESSMENT/PLAN:  Update if she has been to derm (which one--Dr. Pearline Cables?) Pneumovax rec (verify with patient that she hasn't gotten any further pneumonia vaccines from pharmacy).  Needs Tdap from pharmacy in November. COVID booster when available RSV vaccine when available.  RF Synthroid today  Discussed monthly self breast exams and yearly mammograms; at least 30 minutes of aerobic activity at least 5 days/week, weight-bearing exercise at least 2x/week; proper sunscreen use reviewed; healthy diet, including goals of calcium and vitamin D intake and alcohol recommendations (less than or equal to 1 drink/day) reviewed; regular seatbelt use; changing batteries in smoke detectors. Immunization recommendations discussed--continue yearly high dose flu shots.  Updated bivalent COVID booster recommended when available in the Fall. Consider RSV vaccination when available. Tdap is due in November from the pharmacy. Pneumovax booster given today. Colonoscopy recommendations reviewed, UTD, no longer needed DEXA is next due 12/2022   MOST form reviewed and updated, full care.  F/u 6 months for AWV/med check.  Fasting labs prior CBC, TSH, c-met, lipids ENTER FUTURE ORDERS  Can bill [id number] today (and [id number], [place])  Medicare Attestation I have personally reviewed: The patient's medical and  social history Their use of alcohol, tobacco or illicit drugs Their current medications and supplements The patient's functional ability including ADLs,fall risks, home safety risks, cognitive, and hearing and visual impairment Diet and physical activities Evidence for depression or mood disorders  The patient's weight, height, BMI have been recorded in the chart.  I have made referrals, counseling, and provided education to  the patient based on review of the above and I have provided the patient with a written personalized care plan for preventive services.

## 2021-10-08 NOTE — Patient Instructions (Incomplete)
  HEALTH MAINTENANCE RECOMMENDATIONS:  It is recommended that you get at least 30 minutes of aerobic exercise at least 5 days/week (for weight loss, you may need as much as 60-90 minutes). This can be any activity that gets your heart rate up. This can be divided in 10-15 minute intervals if needed, but try and build up your endurance at least once a week.  Weight bearing exercise is also recommended twice weekly.  Eat a healthy diet with lots of vegetables, fruits and fiber.  "Colorful" foods have a lot of vitamins (ie green vegetables, tomatoes, red peppers, etc).  Limit sweet tea, regular sodas and alcoholic beverages, all of which has a lot of calories and sugar.  Up to 1 alcoholic drink daily may be beneficial for women (unless trying to lose weight, watch sugars).  Drink a lot of water.  Calcium recommendations are 1200-1500 mg daily (1500 mg for postmenopausal women or women without ovaries), and vitamin D 1000 IU daily.  This should be obtained from diet and/or supplements (vitamins), and calcium should not be taken all at once, but in divided doses.  Monthly self breast exams and yearly mammograms for women over the age of 42 is recommended.  Sunscreen of at least SPF 30 should be used on all sun-exposed parts of the skin when outside between the hours of 10 am and 4 pm (not just when at beach or pool, but even with exercise, golf, tennis, and yard work!)  Use a sunscreen that says "broad spectrum" so it covers both UVA and UVB rays, and make sure to reapply every 1-2 hours.  Remember to change the batteries in your smoke detectors when changing your clock times in the spring and fall. Carbon monoxide detectors are recommended for your home.  Use your seat belt every time you are in a car, and please drive safely and not be distracted with cell phones and texting while driving.   Ms. Mirabella , Thank you for taking time to come for your Medicare Wellness Visit. I appreciate your ongoing  commitment to your health goals. Please review the following plan we discussed and let me know if I can assist you in the future.   This is a list of the screening recommended for you and due dates:  Health Maintenance  Topic Date Due   COVID-19 Vaccine (5 - Moderna series) 04/20/2021   Flu Shot  10/01/2021   Tetanus Vaccine  01/07/2022   Pneumonia Vaccine  Completed   DEXA scan (bone density measurement)  Completed   Zoster (Shingles) Vaccine  Completed   HPV Vaccine  Aged Out   Continue yearly mammograms. Next bone density test is due 12/2022.  I recommend getting the updated bivalent COVID booster when available in the Fall. Consider getting the new RSV vaccine when available.  You will be due for a tetanus booster in November--you will need to get this from the pharmacy.  We gave pneumovax booster today.  Wait 2 weeks before any other vaccines. Continue yearly high dose flu shots.  If your allergies flare, please start using Flonase daily. You may use some nasal saline spray if needed for any nasal irritation.  Be sure to try and aim the medication away from the midline.  Using the flonase very intermittently doesn't provide as much benefit.

## 2021-10-09 ENCOUNTER — Encounter: Payer: Self-pay | Admitting: Family Medicine

## 2021-10-09 ENCOUNTER — Ambulatory Visit (INDEPENDENT_AMBULATORY_CARE_PROVIDER_SITE_OTHER): Payer: Medicare Other | Admitting: Family Medicine

## 2021-10-09 VITALS — BP 120/60 | HR 60 | Ht <= 58 in | Wt 123.4 lb

## 2021-10-09 DIAGNOSIS — Z5181 Encounter for therapeutic drug level monitoring: Secondary | ICD-10-CM | POA: Diagnosis not present

## 2021-10-09 DIAGNOSIS — Z01419 Encounter for gynecological examination (general) (routine) without abnormal findings: Secondary | ICD-10-CM

## 2021-10-09 DIAGNOSIS — Z0000 Encounter for general adult medical examination without abnormal findings: Secondary | ICD-10-CM

## 2021-10-09 DIAGNOSIS — Z7901 Long term (current) use of anticoagulants: Secondary | ICD-10-CM | POA: Diagnosis not present

## 2021-10-09 DIAGNOSIS — Z23 Encounter for immunization: Secondary | ICD-10-CM | POA: Diagnosis not present

## 2021-10-09 DIAGNOSIS — I480 Paroxysmal atrial fibrillation: Secondary | ICD-10-CM

## 2021-10-09 DIAGNOSIS — R490 Dysphonia: Secondary | ICD-10-CM | POA: Diagnosis not present

## 2021-10-09 DIAGNOSIS — E782 Mixed hyperlipidemia: Secondary | ICD-10-CM | POA: Diagnosis not present

## 2021-10-09 DIAGNOSIS — M85852 Other specified disorders of bone density and structure, left thigh: Secondary | ICD-10-CM

## 2021-10-09 DIAGNOSIS — G4733 Obstructive sleep apnea (adult) (pediatric): Secondary | ICD-10-CM | POA: Diagnosis not present

## 2021-10-09 DIAGNOSIS — R32 Unspecified urinary incontinence: Secondary | ICD-10-CM | POA: Diagnosis not present

## 2021-10-09 DIAGNOSIS — E039 Hypothyroidism, unspecified: Secondary | ICD-10-CM | POA: Diagnosis not present

## 2021-10-09 DIAGNOSIS — E890 Postprocedural hypothyroidism: Secondary | ICD-10-CM

## 2021-10-09 LAB — POCT URINALYSIS DIP (PROADVANTAGE DEVICE)
Bilirubin, UA: NEGATIVE
Blood, UA: NEGATIVE
Glucose, UA: NEGATIVE mg/dL
Ketones, POC UA: NEGATIVE mg/dL
Leukocytes, UA: NEGATIVE
Nitrite, UA: NEGATIVE
Protein Ur, POC: NEGATIVE mg/dL
Specific Gravity, Urine: 1.015
Urobilinogen, Ur: 0.2
pH, UA: 6 (ref 5.0–8.0)

## 2021-10-09 MED ORDER — SYNTHROID 50 MCG PO TABS: ORAL_TABLET | ORAL | 1 refills | Status: DC

## 2021-11-06 ENCOUNTER — Encounter: Payer: Self-pay | Admitting: Internal Medicine

## 2021-11-13 DIAGNOSIS — Z23 Encounter for immunization: Secondary | ICD-10-CM | POA: Diagnosis not present

## 2021-12-04 ENCOUNTER — Telehealth: Payer: Self-pay | Admitting: Licensed Clinical Social Worker

## 2021-12-04 NOTE — Patient Outreach (Signed)
Care Coordination   12/04/2021 Name: Abigail Day MRN: [id number] DOB: 07/10/1933   Care Coordination Outreach Attempts:  An unsuccessful telephone outreach was attempted today to offer the patient information about available care coordination services as a benefit of their health plan.   Follow Up Plan:  Additional outreach attempts will be made to offer the patient care coordination information and services.   Encounter Outcome:  No Answer  Care Coordination Interventions Activated:  No   Care Coordination Interventions:  No, not indicated    Jenel Lucks, MSW, LCSW [facility] Care Management [facility]  [facility] [email or web address] Phone [phone] 5:28 PM

## 2021-12-09 DIAGNOSIS — N3946 Mixed incontinence: Secondary | ICD-10-CM | POA: Diagnosis not present

## 2021-12-10 ENCOUNTER — Encounter: Payer: Self-pay | Admitting: Internal Medicine

## 2021-12-10 DIAGNOSIS — N3944 Nocturnal enuresis: Secondary | ICD-10-CM | POA: Diagnosis not present

## 2021-12-10 DIAGNOSIS — N3946 Mixed incontinence: Secondary | ICD-10-CM | POA: Diagnosis not present

## 2021-12-10 DIAGNOSIS — R351 Nocturia: Secondary | ICD-10-CM | POA: Diagnosis not present

## 2022-01-01 ENCOUNTER — Other Ambulatory Visit: Payer: Self-pay | Admitting: Cardiology

## 2022-01-01 NOTE — Telephone Encounter (Signed)
Prescription refill request for Eliquis received. Indication: PAF Last office visit: 09/16/21  P Martinique MD Scr: 0.83 on 03/25/21 Age: 88 Weight: 55.4kg  Based on above findings Eliquis 2.5mg  twice daily is the appropriate dose.  Refill approved.

## 2022-01-08 DIAGNOSIS — R350 Frequency of micturition: Secondary | ICD-10-CM | POA: Diagnosis not present

## 2022-01-29 DIAGNOSIS — N3946 Mixed incontinence: Secondary | ICD-10-CM | POA: Diagnosis not present

## 2022-01-31 DIAGNOSIS — R350 Frequency of micturition: Secondary | ICD-10-CM | POA: Diagnosis not present

## 2022-02-05 ENCOUNTER — Other Ambulatory Visit (INDEPENDENT_AMBULATORY_CARE_PROVIDER_SITE_OTHER): Payer: Medicare Other

## 2022-02-05 DIAGNOSIS — Z23 Encounter for immunization: Secondary | ICD-10-CM | POA: Diagnosis not present

## 2022-03-02 NOTE — Progress Notes (Unsigned)
Abigail Day Date of Birth: 12-14-1933 Medical Record #[id number]  History of Present Illness: Abigail Day is seen for follow up of Afib. She has a history of paroxysmal atrial fib - on Eliquis. EF is normal per echo back in December of 2013. Normal Myoview in June 2015. Other issues include HLD, hypothyroidism with prior ablation for hyperthyroidism, and diverticulosis. She is  on CPAP therapy for sleep apnea-followed by pulmonary. In 2019 she had some scapular pain and dyspnea. We repeated an Echo and Myoview study which were unremarkable.  She is doing well in follow up. Notes some fatigue at times and improves with a nap. No palpitations now.  She is on CPAP and doing well. Last TSH in normal range.  She is trying to walk more but is limited by dyspnea and low back arthritis.    Current Outpatient Medications on File Prior to Visit  Medication Sig Dispense Refill   apixaban (ELIQUIS) 2.5 MG TABS tablet Take 1 tablet by mouth twice daily 180 tablet 1   Calcium Carbonate-Vitamin D (CALCIUM 600 + D PO) Take 2 tablets by mouth daily.     cholestyramine (QUESTRAN) 4 g packet Take 1 packet (4 g total) by mouth 3 (three) times daily with meals. (Patient not taking: Reported on 10/09/2021) 60 each 2   co-enzyme Q-10 30 MG capsule Take 30 mg by mouth daily.     loperamide (IMODIUM A-D) 2 MG tablet Take 2 mg by mouth 4 (four) times daily as needed for diarrhea or loose stools. (Patient not taking: Reported on 10/09/2021)     loratadine (CLARITIN) 10 MG tablet Take 10 mg by mouth daily.     metoprolol tartrate (LOPRESSOR) 25 MG tablet Take 1 tablet (25 mg total) by mouth 2 (two) times daily. 180 tablet 3   Misc Natural Products (OSTEO BI-FLEX JOINT SHIELD PO) Take 2 tablets by mouth daily.     Multiple Vitamin (MULTI-VITAMIN) tablet Take 1 tablet by mouth daily.     Multiple Vitamins-Minerals (ICAPS PO) Take 1 tablet by mouth daily.     Omega-3 Fatty Acids (FISH OIL OMEGA-3 PO) Take 3,600 mg by mouth daily.       PREVIDENT 5000 PLUS 1.1 % CREA dental cream See admin instructions.  3   Probiotic Product (PROBIOTIC PO) Take 1 capsule by mouth daily.     rosuvastatin (CRESTOR) 20 MG tablet Take 1 tablet (20 mg total) by mouth daily. 90 tablet 3   SYNTHROID 50 MCG tablet TAKE 1 TABLET BY MOUTH ONCE DAILY BEFORE BREAKFAST 5  DAYS  PER  WEEK  .  TAKE  2  TABLETS  ON  SATURDAY  AND  SUNDAY 145 tablet 1   No current facility-administered medications on file prior to visit.    No Known Allergies  Past Medical History:  Diagnosis Date   Atrial fibrillation (HCC)    Diverticulosis    Dyspnea on exertion 02/18/2012   Eye exam abnormal 05/08/15   Dr.Groat-glaucoma suspect   Glaucoma suspect    Hemorrhoids    internal and external   Hemorrhoids 09/25/10   internal and external   Hyperplastic colon polyp 09/25/10   Dr. Ewing Schlein   Osteopenia    Palpitations 02/18/2012   Paroxysmal atrial fibrillation (HCC)    Pure hypercholesterolemia    Sleep apnea    Unspecified hypothyroidism    s/p ablation for hyperthyroidism   Urge urinary incontinence     Past Surgical History:  Procedure Laterality Date  ABDOMINAL HYSTERECTOMY  age 70   with BSO and bladder tack   COLONOSCOPY  11/06, 09/25/10, 12/2015   hyperplastic polyp 2012;    ESOPHAGOGASTRODUODENOSCOPY  09/25/10   tortuous esophagus, intact Nissen fundoplication, dilated the spasm at esophageal GE junction   EYE SURGERY     bilateral cataract surgery   LAPAROSCOPIC NISSEN FUNDOPLICATION  8/07   Dr. Daphine Deutscher   LARYNGOSCOPY  11/24/2017   dx.GERD,start omeprazole    Social History   Tobacco Use  Smoking Status Never  Smokeless Tobacco Never    Social History   Substance and Sexual Activity  Alcohol Use Yes   Alcohol/week: 0.0 standard drinks of alcohol   Comment: once a month or less    Family History  Problem Relation Age of Onset   Heart disease Mother    Heart disease Father    Diabetes Father    Marfan syndrome Sister    Heart  disease Sister        valve replacement   Heart disease Sister        pacemaker   Aortic aneurysm Sister    Peripheral Artery Disease Sister        amputation of toes   Breast cancer Sister 65   Heart disease Sister        CABG at 59   Colon cancer Sister 22   Heart disease Sister        CABG   Heart disease Sister    Heart disease Sister    Hyperlipidemia Sister    Stroke Sister        in 1948, mild   Heart disease Brother    Diabetes Brother    Heart disease Brother    Diabetes Brother    Heart disease Brother    Heart disease Brother    Heart disease Brother        CABG   Heart disease Brother        CABG   Aortic aneurysm Brother 51       repaired (at level of kidney)   Heart disease Brother        stent    Review of Systems: The review of systems is per the HPI.  All other systems were reviewed and are negative.  Physical Exam: There were no vitals taken for this visit. GENERAL:  [name] in NAD HEENT:  PERRL, EOMI, sclera are clear. Oropharynx is clear. NECK:  No jugular venous distention, carotid upstroke brisk and symmetric, no bruits, no thyromegaly or adenopathy LUNGS:  Clear to auscultation bilaterally CHEST:  Unremarkable HEART:  RRR,  PMI not displaced or sustained,S1 and S2 within normal limits, no S3, no S4: no clicks, no rubs, no murmurs ABD:  Soft, nontender. BS +, no masses or bruits. No hepatomegaly, no splenomegaly EXT:  2 + pulses throughout, no edema, no cyanosis no clubbing SKIN:  Warm and dry.  No rashes NEURO:  Alert and oriented x 3. Cranial nerves II through XII intact. PSYCH:  Cognitively intact    LABORATORY DATA:     Lab Results  Component Value Date   WBC 3.6 10/07/2021   HGB 12.4 10/07/2021   HCT 35.4 10/07/2021   PLT 213 10/07/2021   GLUCOSE 89 03/25/2021   CHOL 139 03/25/2021   TRIG 113 03/25/2021   HDL 53 03/25/2021   LDLCALC 66 03/25/2021   ALT 20 03/25/2021   AST 21 03/25/2021   NA 138  03/25/2021   K  4.2 03/25/2021   CL 101 03/25/2021   CREATININE 0.83 03/25/2021   BUN 12 03/25/2021   CO2 24 03/25/2021   TSH 2.150 10/07/2021   HGBA1C 5.4 09/24/2015   Ecg today shows NSR rate 62. Low voltage, nonspecific TWA. I have personally reviewed and interpreted this study.   Echo 09/23/17: Study Conclusions   - Left ventricle: The cavity size was normal. Wall thickness was   normal. Systolic function was normal. The estimated ejection   fraction was in the range of 60% to 65%. Doppler parameters are   consistent with abnormal left ventricular relaxation (grade 1   diastolic dysfunction). The Ee&' ratio is between 8-15, suggesting   indeterminate LV filling pressure. - Left atrium: The atrium was normal in size. - Tricuspid valve: There was mild regurgitation. - Pulmonary arteries: PA peak pressure: 27 mm Hg (S). - Inferior vena cava: The vessel was normal in size. The   respirophasic diameter changes were in the normal range (>= 50%),   consistent with normal central venous pressure.   Impressions:   - Compared to a prior study in 2013, the LVEF is unchanged. Left   and right atria measure normal in size.  Myoview 09/29/17: Study Highlights     Nuclear stress EF: 55%. No T wave inversion was noted during stress. There was no ST segment deviation noted during stress. The study is normal. This is a low risk study.   Normal perfusion. LVEF 55% with normal wall motion. This is a low risk study. No change compared to prior study.      Assessment / Plan:  1. Obstructive sleep apnea. On CPAP therapy  2. PAF - treated with rate control with metoprolol and Eliquis.  Now in NSR. Continue current therapy  3. Hypercholesterolemia. Most recent lipid panel showed excellent LDL of 66 on Crestor.  4. Hypothyroidism. TSH normal.   Follow up in 6 months.

## 2022-03-04 ENCOUNTER — Encounter (HOSPITAL_BASED_OUTPATIENT_CLINIC_OR_DEPARTMENT_OTHER): Payer: Self-pay | Admitting: Pulmonary Disease

## 2022-03-04 ENCOUNTER — Ambulatory Visit (HOSPITAL_BASED_OUTPATIENT_CLINIC_OR_DEPARTMENT_OTHER): Payer: Medicare Other | Admitting: Pulmonary Disease

## 2022-03-04 ENCOUNTER — Ambulatory Visit (INDEPENDENT_AMBULATORY_CARE_PROVIDER_SITE_OTHER): Payer: Medicare Other | Admitting: Pulmonary Disease

## 2022-03-04 VITALS — BP 126/82 | HR 56 | Temp 98.6°F | Ht <= 58 in | Wt 124.6 lb

## 2022-03-04 DIAGNOSIS — G4733 Obstructive sleep apnea (adult) (pediatric): Secondary | ICD-10-CM

## 2022-03-04 NOTE — Patient Instructions (Signed)
Will call you results of your CPAP report  Follow up in 1 year

## 2022-03-04 NOTE — Progress Notes (Signed)
[facility]  Chief Complaint  Patient presents with   Follow-up    1 year follow up. Pt states she is having trouble with [facility] getting the right supplies.    Past Surgical History:  She  has a past surgical history that includes Abdominal hysterectomy (age 89); Eye surgery; Colonoscopy (11/06, 09/25/10, 12/2015); Laparoscopic Nissen fundoplication (0/93); Esophagogastroduodenoscopy (09/25/10); and Laryngoscopy (11/24/2017).  Past Medical History:  Hypothyroidism, HLD, PAF, Osteopenia, Colon polyp, Diverticulosis  Constitutional:  BP 126/82 (BP Location: Left Arm, Patient Position: Sitting, Cuff Size: Normal)   Pulse (!) 56   Temp 98.6 F (37 C) (Oral)   Ht 4\' 10"  (1.473 m)   Wt 124 lb 9.6 oz (56.5 kg)   SpO2 98%   BMI 26.04 kg/m   Brief Summary:  Abigail Day is a 89 y.o. female with obstructive sleep apnea.      Subjective:   She is here with her husband.  She is having trouble getting the correct sized equipment from her DME.  She goes between nasal pillows and nasal cushion.  Nasal pillows fit better, but she will get a sore in her nose after wearing this for a while.  She uses a suave and this helps.  She feels the pressure is comfortable otherwise.  Her husband says she is sleeping well otherwise.  She asked about the [facility] device.  Physical Exam:   Appearance - well kempt   ENMT - no sinus tenderness, no oral exudate, no LAN, Mallampati 3 airway, no stridor, wears dentures  Respiratory - equal breath sounds bilaterally, no wheezing or rales  CV - s1s2 regular rate and rhythm, no murmurs  Ext - no clubbing, no edema  Skin - no rashes  Psych - normal mood and affect    Sleep Tests:  PSG 08/18/12 >> AHI 23, SpO2 low 81% Auto CPAP 12/18/20 to 03/17/21 >> used on 85 of 90 nights with average 6 hrs 54 min. Average AHI 3.4 with average CPAP 6 and 90% CPAP 9 cm H2O.   Cardiac Tests:  Echo 09/23/17 >> EF 60 to 65%,  grade 1 DD, mild TR  Social History:  She  reports that she has never smoked. She has never used smokeless tobacco. She reports current alcohol use. She reports that she does not use drugs.  Family History:  Her family history includes Aortic aneurysm in her sister; Aortic aneurysm (age of onset: 10) in her brother; Breast cancer (age of onset: 47) in her sister; Colon cancer (age of onset: 26) in her sister; Diabetes in her brother, brother, and father; Heart disease in her brother, brother, brother, brother, brother, brother, brother, father, mother, sister, sister, sister, sister, sister, and sister; Hyperlipidemia in her sister; Marfan syndrome in her sister; Peripheral Artery Disease in her sister; Stroke in her sister.     Assessment/Plan:   Obstructive sleep apnea. - she reports compliance with CPAP and benefit from therapy - she uses [facility] for her DME - continue auto CPAP 5 to 12 cm H2O - current CPAP ordered January 2021 - will call her with results of her download - discussed options to help with mask fit - discussed pros/cons of the Inspire device   CPAP rhinitis with dry nose. - flonase caused nose bleeds - continue nasal saline sprays prn   Paroxysmal atrial fibrillation. - followed by Dr. Martinique with cardiology  Time Spent Involved in Patient Care on Day of Examination:  25 minutes  Follow up:   Patient Instructions  Will call you results of your CPAP report  Follow up in 1 year  Medication List:   Allergies as of 03/04/2022   No Known Allergies      Medication List        Accurate as of March 04, 2022  1:22 PM. If you have any questions, ask your nurse or doctor.          CALCIUM 600 + D PO Take 2 tablets by mouth daily.   cholestyramine 4 g packet Commonly known as: Questran Take 1 packet (4 g total) by mouth 3 (three) times daily with meals.   co-enzyme Q-10 30 MG capsule Take 30 mg by mouth daily.   Eliquis 2.5 MG Tabs  tablet Generic drug: apixaban Take 1 tablet by mouth twice daily   FISH OIL OMEGA-3 PO Take 3,600 mg by mouth daily.   ICAPS PO Take 1 tablet by mouth daily.   loperamide 2 MG tablet Commonly known as: IMODIUM A-D Take 2 mg by mouth 4 (four) times daily as needed for diarrhea or loose stools.   loratadine 10 MG tablet Commonly known as: CLARITIN Take 10 mg by mouth daily.   metoprolol tartrate 25 MG tablet Commonly known as: LOPRESSOR Take 1 tablet (25 mg total) by mouth 2 (two) times daily.   Multi-Vitamin tablet Take 1 tablet by mouth daily.   OSTEO BI-FLEX JOINT SHIELD PO Take 2 tablets by mouth daily.   PreviDent 5000 Plus 1.1 % Crea dental cream Generic drug: sodium fluoride See admin instructions.   PROBIOTIC PO Take 1 capsule by mouth daily.   rosuvastatin 20 MG tablet Commonly known as: CRESTOR Take 1 tablet (20 mg total) by mouth daily.   Synthroid 50 MCG tablet Generic drug: levothyroxine TAKE 1 TABLET BY MOUTH ONCE DAILY BEFORE BREAKFAST 5  DAYS  PER  WEEK  .  TAKE  2  TABLETS  ON  SATURDAY  AND  SUNDAY        Signature:  Chesley Mires, MD [place] Pager - [phone] 03/04/2022, 1:22 PM

## 2022-03-06 ENCOUNTER — Ambulatory Visit: Payer: Medicare Other | Attending: Cardiology | Admitting: Cardiology

## 2022-03-06 ENCOUNTER — Encounter: Payer: Self-pay | Admitting: Cardiology

## 2022-03-06 VITALS — BP 123/78 | HR 69 | Ht <= 58 in | Wt 123.6 lb

## 2022-03-06 DIAGNOSIS — R0609 Other forms of dyspnea: Secondary | ICD-10-CM

## 2022-03-06 DIAGNOSIS — E782 Mixed hyperlipidemia: Secondary | ICD-10-CM

## 2022-03-06 DIAGNOSIS — I480 Paroxysmal atrial fibrillation: Secondary | ICD-10-CM

## 2022-03-06 DIAGNOSIS — G4733 Obstructive sleep apnea (adult) (pediatric): Secondary | ICD-10-CM | POA: Diagnosis not present

## 2022-03-06 MED ORDER — APIXABAN 2.5 MG PO TABS
2.5000 mg | ORAL_TABLET | Freq: Two times a day (BID) | ORAL | 3 refills | Status: DC
Start: 2022-03-06 — End: 2023-03-24

## 2022-03-07 DIAGNOSIS — R350 Frequency of micturition: Secondary | ICD-10-CM | POA: Diagnosis not present

## 2022-03-07 DIAGNOSIS — N3946 Mixed incontinence: Secondary | ICD-10-CM | POA: Diagnosis not present

## 2022-03-08 ENCOUNTER — Other Ambulatory Visit: Payer: Self-pay | Admitting: Family Medicine

## 2022-03-08 DIAGNOSIS — E782 Mixed hyperlipidemia: Secondary | ICD-10-CM

## 2022-03-10 NOTE — Telephone Encounter (Signed)
Spoke with patient and she needed a 30 day refill to get her to her appt in February with Dr. Tomi Bamberger

## 2022-03-11 ENCOUNTER — Other Ambulatory Visit: Payer: Self-pay | Admitting: Family Medicine

## 2022-03-11 DIAGNOSIS — Z1231 Encounter for screening mammogram for malignant neoplasm of breast: Secondary | ICD-10-CM

## 2022-03-18 ENCOUNTER — Telehealth: Payer: Self-pay | Admitting: Pulmonary Disease

## 2022-03-19 NOTE — Telephone Encounter (Signed)
Latest CPAP report in [facility] is from over a year ago  [place] and spoke with [place] and she states she states there is not a more recent update showing for her but she will have the RT look into this and will give our office a call back if patient needs to bring card into office.. Also gave her [facility] office fax number in case they were able to get a DL on patient.

## 2022-03-19 NOTE — Telephone Encounter (Signed)
I haven't received her download.

## 2022-03-25 NOTE — Telephone Encounter (Signed)
Report received via fax. Will fax to [facility] fax number and hold on to original copy in [facility] until reviewed.   Faxed to [place] and routing encounter to Allene Dillon and Dr. Halford Chessman to make you all aware.

## 2022-03-25 NOTE — Telephone Encounter (Signed)
Called [facility] for an update on patients DL. Spoke with Linus Orn and she states they are working on getting her DL and will fax it over to [place]. Gave her [facility] office fax number. She states patient called a few days ago asking for a DL from them and they had a hard time getting into [facility] at the time but are working on it.

## 2022-03-26 NOTE — Telephone Encounter (Signed)
Received and placed in [facility] folder to be taken over, Dr. Halford Chessman returns 2/5 to [place].

## 2022-04-03 ENCOUNTER — Telehealth (HOSPITAL_BASED_OUTPATIENT_CLINIC_OR_DEPARTMENT_OTHER): Payer: Self-pay | Admitting: Pulmonary Disease

## 2022-04-05 ENCOUNTER — Other Ambulatory Visit: Payer: Self-pay | Admitting: Family Medicine

## 2022-04-05 DIAGNOSIS — E782 Mixed hyperlipidemia: Secondary | ICD-10-CM

## 2022-04-07 NOTE — Telephone Encounter (Signed)
Finally received CPAP download.  Auto CPAP 02/23/22 to 03/24/22 >> used on 30 of 30 nights with average 6 hrs 29 min.  Average AHI 5.5 with median CPAP 7 and 90 th percentile CPAP 9 cm H2O.  Please let her know her CPAP report shows great control of sleep apnea with current settings.  No change to set up needed at this time.

## 2022-04-07 NOTE — Telephone Encounter (Signed)
Spoke with patient regarding CPAP download results. They verbalized understanding. No further questions.  Nothing further needed at this time.

## 2022-04-14 ENCOUNTER — Other Ambulatory Visit: Payer: Medicare Other

## 2022-04-14 DIAGNOSIS — E039 Hypothyroidism, unspecified: Secondary | ICD-10-CM | POA: Diagnosis not present

## 2022-04-14 DIAGNOSIS — Z5181 Encounter for therapeutic drug level monitoring: Secondary | ICD-10-CM | POA: Diagnosis not present

## 2022-04-14 DIAGNOSIS — E782 Mixed hyperlipidemia: Secondary | ICD-10-CM | POA: Diagnosis not present

## 2022-04-14 DIAGNOSIS — Z7901 Long term (current) use of anticoagulants: Secondary | ICD-10-CM | POA: Diagnosis not present

## 2022-04-15 LAB — CBC WITH DIFFERENTIAL/PLATELET
Basophils Absolute: 0 10*3/uL (ref 0.0–0.2)
Basos: 1 %
EOS (ABSOLUTE): 0.2 10*3/uL (ref 0.0–0.4)
Eos: 4 %
Hematocrit: 35.9 % (ref 34.0–46.6)
Hemoglobin: 11.8 g/dL (ref 11.1–15.9)
Immature Grans (Abs): 0 10*3/uL (ref 0.0–0.1)
Immature Granulocytes: 0 %
Lymphocytes Absolute: 0.9 10*3/uL (ref 0.7–3.1)
Lymphs: 25 %
MCH: 32.4 pg (ref 26.6–33.0)
MCHC: 32.9 g/dL (ref 31.5–35.7)
MCV: 99 fL — ABNORMAL HIGH (ref 79–97)
Monocytes Absolute: 0.5 10*3/uL (ref 0.1–0.9)
Monocytes: 13 %
Neutrophils Absolute: 2 10*3/uL (ref 1.4–7.0)
Neutrophils: 57 %
Platelets: 222 10*3/uL (ref 150–450)
RBC: 3.64 x10E6/uL — ABNORMAL LOW (ref 3.77–5.28)
RDW: 11.9 % (ref 11.7–15.4)
WBC: 3.5 10*3/uL (ref 3.4–10.8)

## 2022-04-15 LAB — TSH: TSH: 5.2 u[IU]/mL — ABNORMAL HIGH (ref 0.450–4.500)

## 2022-04-15 LAB — COMPREHENSIVE METABOLIC PANEL
ALT: 21 IU/L (ref 0–32)
AST: 23 IU/L (ref 0–40)
Albumin/Globulin Ratio: 2.1 (ref 1.2–2.2)
Albumin: 4.4 g/dL (ref 3.7–4.7)
Alkaline Phosphatase: 78 IU/L (ref 44–121)
BUN/Creatinine Ratio: 16 (ref 12–28)
BUN: 12 mg/dL (ref 8–27)
Bilirubin Total: 0.3 mg/dL (ref 0.0–1.2)
CO2: 22 mmol/L (ref 20–29)
Calcium: 9.4 mg/dL (ref 8.7–10.3)
Chloride: 100 mmol/L (ref 96–106)
Creatinine, Ser: 0.77 mg/dL (ref 0.57–1.00)
Globulin, Total: 2.1 g/dL (ref 1.5–4.5)
Glucose: 95 mg/dL (ref 70–99)
Potassium: 4.2 mmol/L (ref 3.5–5.2)
Sodium: 137 mmol/L (ref 134–144)
Total Protein: 6.5 g/dL (ref 6.0–8.5)
eGFR: 75 mL/min/{1.73_m2} (ref >59)

## 2022-04-15 LAB — LIPID PANEL
Chol/HDL Ratio: 2.6 ratio (ref 0.0–4.4)
Cholesterol, Total: 133 mg/dL (ref 100–199)
HDL: 52 mg/dL (ref >39)
LDL Chol Calc (NIH): 60 mg/dL (ref 0–99)
Triglycerides: 120 mg/dL (ref 0–149)
VLDL Cholesterol Cal: 21 mg/dL (ref 5–40)

## 2022-04-15 NOTE — Progress Notes (Signed)
Chief Complaint  Patient presents with   Hyperlipidemia    Nonfasting med check, labs already done. Patient reports skin is drier than usual, having some dry mouth, fatigue and hairloss. She is also on a new medication Gemtesa, through urology. Did not get RSV vaccine yet or Tdap. (I didn't see in [place], she thinks she did though, she will check when she gets home as she thinks she got Tdap).   Patient presents for 6 month follow-up on chronic problems. See below for labs done prior to visit.  Hypothyroidism:  She takes her medication on an empty stomach, separate from food and other medications.  She is compliant with taking Synthroid. Current dose is 50 mcg tablet, 1 pill Monday through Friday, and 2 pills on Sat and Sunday. TSH has been at goal on this regimen since 11/2020.  TSH was suppressed on 28mg daily, and under-replaced on 559m daily.   She denies missed pills. She takes a hair/skin/nail vitamin for the last month--but nails have been more brittle recently. Hair seems dry, no change in amount loss, no thinning of the hair.  No changes to bowels (sometimes loose after dinner).  She feels more fatigued, needs to rest more often in the last 2-3 months, especially when very active.  Component Ref Range & Units 6 mo ago (10/07/21) 1 yr ago (03/25/21) 1 yr ago (11/01/20) 1 yr ago (09/19/20) 1 yr ago (05/07/20) 2 yr ago (03/14/20) 2 yr ago (10/26/19)  TSH 0.450 - 4.500 uIU/mL 2.150 2.700 1.970 6.250 High  2.530 0.206 Low  1.080     Mixed hyperlipidemia:  She is compliant with taking Crestor, and denies side effects (previously took pravastatin which wasn't getting her to goal for TG; previously didn't tolerate lipitor) She continues on fish oil (2/d),  and tries to follow lowfat, low cholesterol diet. She is also taking Coenzyme Q10 daily.     Atrial fibrillation:  She remains on Eliquis without any bleeding or complications, and is on metoprolol for rate control.  She sees Dr. JoMartiniquevery 6  months, last seen in January, and was in NSR at that time.  She reports she can't always tell when she is in afib.  Some mornings she notes some palpitations, short-lived. No associated SOB or CP.  Only has DOE with rushing, walking much faster, and with stairs. Denies any change/worsening in her shortness of breath.   HTN:  Compliant with medications.  Denies headaches, dizziness, chest pain, shortness of breath.  BP Readings from Last 3 Encounters:  04/16/22 122/62  03/06/22 123/78  03/04/22 126/82    OSA:  She is using CPAP, and doing well, under the care of Dr. SoHalford ChessmanShe had recent visit with good compliance.    Osteopenia--She took Bo[place]iquehrough the end of June 2021 (had taken off/on, for total of about 5 years) Last DEXA was in 12/2020, stable compared to prior study, with T-1.6 at L femoral neck. She continues to take Calcium and D (at lunch). She goes to the gym 2-3x/week, and uses weights. Does chair yoga 3x/week.   She has h/o back pain, previously saw Dr. BrRolena InfanteDr. RaNelva BushShe had physical therapy, which helped.  She is doing home exercises, and chair yoga, both of which help. Also has h/o hip bursitis, periodically gets injections from ortho.  She still has some soreness at the right hip. She feels it is her muscles, rubs it and the discomfort eases off. She hasn't had a shot in a long  time, doesn't feel like she needs one.   Allergies--She uses claritin daily, and flonase just sporadically, very intermittently. She uses saline spray twice daily.   Hoarseness:  This is stable. She previously saw Dr. Benjamine Mola, diagnosed with laryngopharyngeal reflux, and PPI was increased, but she didn't see any benefit to her symptoms. She has undergone rehab through [facility] Voice lab.  She was told it was related to her thyroid treatment (from voice specialist), and was advised to stop the omeprazole entirely.  She remains off PPI and denies any recurrent reflux symptoms.  If she drinks a lot of water,  her voice improves. She continues to do home exercises (not as regularly as she should). Hoarseness is worse in the  mornings improves with drinking water, and resolves later in the day.    PMH, PSH, SH reviewed  Outpatient Encounter Medications as of 04/16/2022  Medication Sig Note   apixaban (ELIQUIS) 2.5 MG TABS tablet Take 1 tablet (2.5 mg total) by mouth 2 (two) times daily.    Ascorbic Acid (VITAMIN C) 500 MG CHEW Chew 1 each by mouth daily.    Calcium Carbonate-Vitamin D (CALCIUM 600 + D PO) Take 2 tablets by mouth daily.    co-enzyme Q-10 30 MG capsule Take 30 mg by mouth daily.    loratadine (CLARITIN) 10 MG tablet Take 10 mg by mouth daily.    metoprolol tartrate (LOPRESSOR) 25 MG tablet Take 1 tablet (25 mg total) by mouth 2 (two) times daily.    Misc Natural Products (OSTEO BI-FLEX JOINT SHIELD PO) Take 2 tablets by mouth daily.    Multiple Vitamin (MULTI-VITAMIN) tablet Take 1 tablet by mouth daily.    Multiple Vitamins-Minerals (HAIR SKIN AND NAILS FORMULA PO) Take 2 capsules by mouth daily.    Multiple Vitamins-Minerals (ICAPS PO) Take 1 tablet by mouth daily.    Omega-3 Fatty Acids (FISH OIL OMEGA-3 PO) Take 2,400 mg by mouth daily.    PREVIDENT 5000 PLUS 1.1 % CREA dental cream See admin instructions.    Probiotic Product (PROBIOTIC PO) Take 1 capsule by mouth daily.    rosuvastatin (CRESTOR) 20 MG tablet Take 1 tablet by mouth once daily    SYNTHROID 50 MCG tablet TAKE 1 TABLET BY MOUTH ONCE DAILY BEFORE BREAKFAST 5  DAYS  PER  WEEK  .  TAKE  2  TABLETS  ON  SATURDAY  AND  SUNDAY    Vibegron (GEMTESA) 75 MG TABS Take 1 tablet by mouth daily.    [DISCONTINUED] fluocinonide gel (LIDEX) [id number] % Apply 1 Application topically 4 (four) times daily.    cholestyramine (QUESTRAN) 4 g packet Take 1 packet (4 g total) by mouth 3 (three) times daily with meals. (Patient not taking: Reported on 04/16/2022) 03/27/2021: Hasn't been using.  It helped just a little.  Probiotic seems to help  more   loperamide (IMODIUM A-D) 2 MG tablet Take 2 mg by mouth 4 (four) times daily as needed for diarrhea or loose stools. (Patient not taking: Reported on 04/16/2022) 04/16/2022: As needed   No facility-administered encounter medications on file as of 04/16/2022.   No Known Allergies  ROS: No fever, chills, URI symptoms, cough, shortness of breath. No chest pain, shortness of breath, palpitations (just first thing in the morning). Mild R hip pain, improved. No nausea or vomiting.  Gets occasional diarrhea after dinner, depending on what she eats. Denies any heartburn, dysphagia Urinary incontinence--Gemtesa isn't helping much, has f/u with urologist today. She might empty a  little better, but still has incontinence. She wears Poise pads. Denies dysuria, hematuria. Some stiffness in the morning Moods are good.   PHYSICAL EXAM:  BP 122/62   Pulse 68   Ht 4' 10"$  (1.473 m)   Wt 124 lb 6.4 oz (56.4 kg)   BMI 26.00 kg/m   Wt Readings from Last 3 Encounters:  04/16/22 124 lb 6.4 oz (56.4 kg)  03/06/22 123 lb 9.6 oz (56.1 kg)  03/04/22 124 lb 9.6 oz (56.5 kg)   Well-appearing, pleasant elderly female in no distress. Hoarse voice. HEENT: conjunctiva and sclera are clear, EOMI, OP clear Neck: no lymphadenopathy or mass, no bruit Heart: regular rate and rhythm, no murmur Lungs: clear bilaterally Abdomen: Soft, nontender, no organomegaly or mass. Back: no spinal or CVA tenderness Extremities: no edema, normal pulses. Neuro: alert and oriented. Normal strength, gait Psych: normal mood, affect, hygiene and grooming  Lab Results  Component Value Date   TSH 5.200 (H) 04/14/2022   Lab Results  Component Value Date   WBC 3.5 04/14/2022   HGB 11.8 04/14/2022   HCT 35.9 04/14/2022   MCV 99 (H) 04/14/2022   PLT 222 04/14/2022   Fasting glucose 95    Chemistry      Component Value Date/Time   NA 137 04/14/2022 0837   K 4.2 04/14/2022 0837   CL 100 04/14/2022 0837   CO2 22  04/14/2022 0837   BUN 12 04/14/2022 0837   CREATININE 0.77 04/14/2022 0837   CREATININE 0.83 04/21/2016 0852      Component Value Date/Time   CALCIUM 9.4 04/14/2022 0837   ALKPHOS 78 04/14/2022 0837   AST 23 04/14/2022 0837   ALT 21 04/14/2022 0837   BILITOT 0.3 04/14/2022 0837     Lab Results  Component Value Date   CHOL 133 04/14/2022   HDL 52 04/14/2022   LDLCALC 60 04/14/2022   TRIG 120 04/14/2022   CHOLHDL 2.6 04/14/2022     ASSESSMENT/PLAN:  Postablative hypothyroidism - elevated TSH, more brittle nails.  Increase dose (118mg MWF, 58m the other 4 days). recheck TSH in 6-8 wks, stop biotin a week prior to test - Plan: TSH, SYNTHROID 50 MCG tablet  Paroxysmal atrial fibrillation (HCC) - doing well on current regimen per Dr. [facility]MartiniqueAc[facility]. Continues on Eliquis  Mixed hyperlipidemia - controlled on current regimen. Cont lowfat, low cholesterol diet - Plan: rosuvastatin (CRESTOR) 20 MG tablet  Osteopenia of neck of left femur - continue calcium, D, weight-bearing exercise   Take  1 pill (50 mcg) 4 days/week (Tues, Thurs, Saturday and Sunday) Take 2 pills (100 mcg) 3 times/week (Monday, Wednesday, Friday)/= 500 mcg/week.  RSV vaccine discussed/recommended (may wait until Fall) Needs to check with pharmacy to see if she got Tdap, and get one if she didn't.  RF Crestor x 1 year Thinks thyroid med will last until next check.  F/u 6 wks for nonfasting labs, 6 months for AWV

## 2022-04-16 ENCOUNTER — Ambulatory Visit (INDEPENDENT_AMBULATORY_CARE_PROVIDER_SITE_OTHER): Payer: Medicare Other | Admitting: Family Medicine

## 2022-04-16 ENCOUNTER — Encounter: Payer: Self-pay | Admitting: Family Medicine

## 2022-04-16 VITALS — BP 122/62 | HR 68 | Ht <= 58 in | Wt 124.4 lb

## 2022-04-16 DIAGNOSIS — I480 Paroxysmal atrial fibrillation: Secondary | ICD-10-CM

## 2022-04-16 DIAGNOSIS — E890 Postprocedural hypothyroidism: Secondary | ICD-10-CM | POA: Diagnosis not present

## 2022-04-16 DIAGNOSIS — E039 Hypothyroidism, unspecified: Secondary | ICD-10-CM

## 2022-04-16 DIAGNOSIS — M85852 Other specified disorders of bone density and structure, left thigh: Secondary | ICD-10-CM | POA: Diagnosis not present

## 2022-04-16 DIAGNOSIS — D6869 Other thrombophilia: Secondary | ICD-10-CM

## 2022-04-16 DIAGNOSIS — N3946 Mixed incontinence: Secondary | ICD-10-CM | POA: Diagnosis not present

## 2022-04-16 DIAGNOSIS — E782 Mixed hyperlipidemia: Secondary | ICD-10-CM | POA: Diagnosis not present

## 2022-04-16 DIAGNOSIS — N390 Urinary tract infection, site not specified: Secondary | ICD-10-CM | POA: Diagnosis not present

## 2022-04-16 DIAGNOSIS — R350 Frequency of micturition: Secondary | ICD-10-CM | POA: Diagnosis not present

## 2022-04-16 MED ORDER — SYNTHROID 50 MCG PO TABS: ORAL_TABLET | ORAL | 0 refills | Status: DC

## 2022-04-16 MED ORDER — ROSUVASTATIN CALCIUM 20 MG PO TABS: 20.0000 mg | ORAL_TABLET | Freq: Every day | ORAL | 3 refills | Status: DC

## 2022-04-16 NOTE — Patient Instructions (Addendum)
When you allergies get worse with the season change, you should use the Flonase daily, not just sporadically (along with your claritin).  Double check your records at home regarding your vaccines. We need to know if (and when) you got your Tdap (tetanus booster).   RSV vaccine is recommended. You can either get it from the pharmacy now, or wait until the Fall (and get with your flu shot).  We are changing your thyroid medication dose: Take  1 pill (50 mcg) 4 days/week (Tues, Thurs, Saturday and Sunday) Take 2 pills (100 mcg) 3 times/week (Monday, Wednesday, Friday). This dose is slightly higher, and hopefull you will feel better on it. You will return for a lab visit in 6 weeks to have your thyroid rechecked. You need to stop taking your hair/skin/nail vitamin at least 1 week prior to that blood test.

## 2022-04-22 DIAGNOSIS — N281 Cyst of kidney, acquired: Secondary | ICD-10-CM | POA: Diagnosis not present

## 2022-05-02 ENCOUNTER — Ambulatory Visit
Admission: RE | Admit: 2022-05-02 | Discharge: 2022-05-02 | Disposition: A | Discharge status: Home / Self Care | Payer: Medicare Other | Source: Ambulatory Visit | Attending: Family Medicine | Admitting: Family Medicine

## 2022-05-02 ENCOUNTER — Telehealth: Payer: Self-pay | Admitting: Cardiology

## 2022-05-02 DIAGNOSIS — Z1231 Encounter for screening mammogram for malignant neoplasm of breast: Secondary | ICD-10-CM

## 2022-05-02 NOTE — Telephone Encounter (Signed)
*  Pt is in retirement home & won't be able to answer after 4:30pm, but pt states she does have an answering machine and gives permission to leave message on there.  Pt calls in and states that Malachy Mood told her to call on March 1st to get an appt with Dr. [place] for July. Offered to schedule pt on his first available 11/14/22, as well as wait list, pt declined. Pt is insisting on talking with Malachy Mood, to get her to get her on the books for July. Advised pt that Malachy Mood is out of office til 3/13, but that someone will call her back.

## 2022-05-02 NOTE — Telephone Encounter (Signed)
Called patient, advised that we had an openings on July 9th at 1:30 PM.   Patient verbalized understanding.

## 2022-05-28 ENCOUNTER — Other Ambulatory Visit: Payer: Self-pay | Admitting: *Deleted

## 2022-05-28 ENCOUNTER — Other Ambulatory Visit: Payer: Medicare Other

## 2022-05-28 DIAGNOSIS — E890 Postprocedural hypothyroidism: Secondary | ICD-10-CM | POA: Diagnosis not present

## 2022-05-28 MED ORDER — FLUTICASONE PROPIONATE 50 MCG/ACT NA SUSP: 2.0000 | Freq: Every day | NASAL | 0 refills | Status: DC

## 2022-05-29 ENCOUNTER — Other Ambulatory Visit: Payer: Self-pay | Admitting: Family Medicine

## 2022-05-29 LAB — TSH: TSH: 3.85 u[IU]/mL (ref 0.450–4.500)

## 2022-05-29 NOTE — Telephone Encounter (Signed)
ins will cover mometasone spy or flunisolide spray send new rx if want to change.   See if she wants to just get the Flonase that is over-the-counter, or if she wants [place] to send in one of the other inhaled nasal sprays.  Okay to send in rx if she prefers that over OTC flonase,

## 2022-05-29 NOTE — Telephone Encounter (Signed)
Pt will get flonase over the counter to see if this will help

## 2022-06-04 DIAGNOSIS — H353131 Nonexudative age-related macular degeneration, bilateral, early dry stage: Secondary | ICD-10-CM | POA: Diagnosis not present

## 2022-06-04 DIAGNOSIS — H02831 Dermatochalasis of right upper eyelid: Secondary | ICD-10-CM | POA: Diagnosis not present

## 2022-06-04 DIAGNOSIS — H35371 Puckering of macula, right eye: Secondary | ICD-10-CM | POA: Diagnosis not present

## 2022-06-04 DIAGNOSIS — H401111 Primary open-angle glaucoma, right eye, mild stage: Secondary | ICD-10-CM | POA: Diagnosis not present

## 2022-06-04 DIAGNOSIS — H04123 Dry eye syndrome of bilateral lacrimal glands: Secondary | ICD-10-CM | POA: Diagnosis not present

## 2022-06-04 DIAGNOSIS — H43811 Vitreous degeneration, right eye: Secondary | ICD-10-CM | POA: Diagnosis not present

## 2022-06-04 DIAGNOSIS — H02834 Dermatochalasis of left upper eyelid: Secondary | ICD-10-CM | POA: Diagnosis not present

## 2022-06-04 DIAGNOSIS — Z961 Presence of intraocular lens: Secondary | ICD-10-CM | POA: Diagnosis not present

## 2022-06-04 DIAGNOSIS — H31091 Other chorioretinal scars, right eye: Secondary | ICD-10-CM | POA: Diagnosis not present

## 2022-06-19 NOTE — Telephone Encounter (Signed)
Seems like encounter was open in error so closing encounter.  

## 2022-06-20 DIAGNOSIS — N3946 Mixed incontinence: Secondary | ICD-10-CM | POA: Diagnosis not present

## 2022-06-20 DIAGNOSIS — N390 Urinary tract infection, site not specified: Secondary | ICD-10-CM | POA: Diagnosis not present

## 2022-06-20 DIAGNOSIS — R350 Frequency of micturition: Secondary | ICD-10-CM | POA: Diagnosis not present

## 2022-07-10 DIAGNOSIS — R350 Frequency of micturition: Secondary | ICD-10-CM | POA: Diagnosis not present

## 2022-07-10 DIAGNOSIS — N3946 Mixed incontinence: Secondary | ICD-10-CM | POA: Diagnosis not present

## 2022-07-13 ENCOUNTER — Other Ambulatory Visit: Payer: Self-pay | Admitting: Family Medicine

## 2022-07-13 DIAGNOSIS — E890 Postprocedural hypothyroidism: Secondary | ICD-10-CM

## 2022-07-17 DIAGNOSIS — R350 Frequency of micturition: Secondary | ICD-10-CM | POA: Diagnosis not present

## 2022-07-24 ENCOUNTER — Telehealth: Payer: Self-pay | Admitting: Family Medicine

## 2022-07-24 ENCOUNTER — Encounter: Payer: Self-pay | Admitting: *Deleted

## 2022-07-24 DIAGNOSIS — R350 Frequency of micturition: Secondary | ICD-10-CM | POA: Diagnosis not present

## 2022-07-24 NOTE — Telephone Encounter (Signed)
Left message for patient to call me back. 

## 2022-07-24 NOTE — Telephone Encounter (Signed)
Per February note-- Prior to visit she was taking 50 mcg tablet, 1 pill Monday through Friday, and 2 pills on Sat and Sunday.  Her TSH was elevated at 5.2, so at her 04/2022 visit, her doe was CHANGED to:  Take  1 pill (50 mcg) 4 days/week (Tues, Thurs, Saturday and Sunday) Take 2 pills (100 mcg) 3 times/week (Monday, Wednesday, Friday)/= 500 mcg/week.  Recheck 3/27 was at goal, and patient was advised: "This dose is better--I hope you feel better (I believe you noticed more brittle nails). Continue current dose--100 mcg on Monday/Wednesday/Friday, and 50 mcg the other 4 days of the week."  Looks like when her med refill was sent 5/13, dose change was not noted.  Please call patient and clarify. This supply won't quite last 90d

## 2022-07-24 NOTE — Telephone Encounter (Signed)
Pt called and states she is confused about her synthroid rx. She states that at her appt she was advised that her instructions for synthroid would stay the same. Per pt she double checked her AVS and it said the same thing. At that time they were take 1 tablet daily before breakfast 5 days a week. Take 2 tablets on Monday, Wed and Friday.  When the medication was filled this time instruction are as follows Take once daily before breakfast 5 days per week. Take 2 tablets Saturday and Sunday. Please advise pt at [phone] or [phone].  ALSO pt wanted to make sure you were aware of what is going on with her at [facility]. I advised pt that we have received notes from them concerning her care. Pt wanted to make sure that you knew she was on oxybutynin chloride and receiving shock treatment on her bladder. She states that once she receives a certain amount she will be weaned off the medication.

## 2022-07-24 NOTE — Telephone Encounter (Signed)
She has been taking correctly, 100mg  Mon, Wed and Fri and 50 all other days. She is scheduled for August, if she needs a refill she will let me know-I made the changes to the chart.

## 2022-07-31 DIAGNOSIS — R350 Frequency of micturition: Secondary | ICD-10-CM | POA: Diagnosis not present

## 2022-08-07 DIAGNOSIS — R350 Frequency of micturition: Secondary | ICD-10-CM | POA: Diagnosis not present

## 2022-08-14 DIAGNOSIS — R350 Frequency of micturition: Secondary | ICD-10-CM | POA: Diagnosis not present

## 2022-08-14 DIAGNOSIS — N3946 Mixed incontinence: Secondary | ICD-10-CM | POA: Diagnosis not present

## 2022-08-21 DIAGNOSIS — R350 Frequency of micturition: Secondary | ICD-10-CM | POA: Diagnosis not present

## 2022-08-28 DIAGNOSIS — R350 Frequency of micturition: Secondary | ICD-10-CM | POA: Diagnosis not present

## 2022-09-05 DIAGNOSIS — R350 Frequency of micturition: Secondary | ICD-10-CM | POA: Diagnosis not present

## 2022-09-05 NOTE — Progress Notes (Unsigned)
Abigail Day Date of Birth: 1933/06/14 Medical Record #[id number]  History of Present Illness: Abigail Day is seen for follow up of Afib. She has a history of paroxysmal atrial fib - on Eliquis. EF is normal per echo back in December of 2013. Normal Myoview in June 2015. Other issues include HLD, hypothyroidism with prior ablation for hyperthyroidism, and diverticulosis. She is  on CPAP therapy for sleep apnea-followed by pulmonary. In 2019 she had some scapular pain and dyspnea. We repeated an Echo and Myoview study which were unremarkable.  On follow up today she does note she gets SOB easily. Fatigues. No chest pain. No edema. No palpitations now. Feels jittery at times.  She is on CPAP followed by Dr Craige Cotta. Last TSH in normal range.  She is trying to walk more but is limited by  low back arthritis.    Current Outpatient Medications on File Prior to Visit  Medication Sig Dispense Refill   apixaban (ELIQUIS) 2.5 MG TABS tablet Take 1 tablet (2.5 mg total) by mouth 2 (two) times daily. 180 tablet 3   Ascorbic Acid (VITAMIN C) 500 MG CHEW Chew 1 each by mouth daily.     Calcium Carbonate-Vitamin D (CALCIUM 600 + D PO) Take 2 tablets by mouth daily.     cholestyramine (QUESTRAN) 4 g packet Take 1 packet (4 g total) by mouth 3 (three) times daily with meals. (Patient not taking: Reported on 04/16/2022) 60 each 2   co-enzyme Q-10 30 MG capsule Take 30 mg by mouth daily.     fluticasone (FLONASE) 50 MCG/ACT nasal spray Place 2 sprays into both nostrils daily. 48 g 0   loperamide (IMODIUM A-D) 2 MG tablet Take 2 mg by mouth 4 (four) times daily as needed for diarrhea or loose stools. (Patient not taking: Reported on 04/16/2022)     loratadine (CLARITIN) 10 MG tablet Take 10 mg by mouth daily.     metoprolol tartrate (LOPRESSOR) 25 MG tablet Take 1 tablet (25 mg total) by mouth 2 (two) times daily. 180 tablet 3   Misc Natural Products (OSTEO BI-FLEX JOINT SHIELD PO) Take 2 tablets by mouth daily.      Multiple Vitamin (MULTI-VITAMIN) tablet Take 1 tablet by mouth daily.     Multiple Vitamins-Minerals (HAIR SKIN AND NAILS FORMULA PO) Take 2 capsules by mouth daily.     Multiple Vitamins-Minerals (ICAPS PO) Take 1 tablet by mouth daily.     Omega-3 Fatty Acids (FISH OIL OMEGA-3 PO) Take 2,400 mg by mouth daily.     PREVIDENT 5000 PLUS 1.1 % CREA dental cream See admin instructions.  3   Probiotic Product (PROBIOTIC PO) Take 1 capsule by mouth daily.     rosuvastatin (CRESTOR) 20 MG tablet Take 1 tablet (20 mg total) by mouth daily. 90 tablet 3   SYNTHROID 50 MCG tablet TAKE 1 TABLET BY MOUTH ONCE DAILY BEFORE BREAKFAST 5  DAYS  PER  WEEK  AND  2  TABLETS  ON  SATURDAY  AND  SUNDAY (Patient taking differently: Take 50 mcg by mouth daily before breakfast. TAKE 1 tablet daily and 2 tabs ( ) M,W,F)) 145 tablet 0   Vibegron (GEMTESA) 75 MG TABS Take 1 tablet by mouth daily.     No current facility-administered medications on file prior to visit.    No Known Allergies  Past Medical History:  Diagnosis Date   Atrial fibrillation (HCC)    Diverticulosis    Dyspnea on exertion 02/18/2012  Eye exam abnormal 05/08/15   Dr.Groat-glaucoma suspect   Glaucoma suspect    Hemorrhoids    internal and external   Hemorrhoids 09/25/10   internal and external   Hyperplastic colon polyp 09/25/10   Dr. Ewing Schlein   Osteopenia    Palpitations 02/18/2012   Paroxysmal atrial fibrillation (HCC)    Pure hypercholesterolemia    Sleep apnea    Unspecified hypothyroidism    s/p ablation for hyperthyroidism   Urge urinary incontinence     Past Surgical History:  Procedure Laterality Date   ABDOMINAL HYSTERECTOMY  age 31   with BSO and bladder tack   COLONOSCOPY  11/06, 09/25/10, 12/2015   hyperplastic polyp 2012;    ESOPHAGOGASTRODUODENOSCOPY  09/25/10   tortuous esophagus, intact Nissen fundoplication, dilated the spasm at esophageal GE junction   EYE SURGERY     bilateral cataract surgery    LAPAROSCOPIC NISSEN FUNDOPLICATION  8/07   Dr. Daphine Deutscher   LARYNGOSCOPY  11/24/2017   dx.GERD,start omeprazole    Social History   Tobacco Use  Smoking Status Never  Smokeless Tobacco Never    Social History   Substance and Sexual Activity  Alcohol Use Yes   Alcohol/week: 0.0 standard drinks of alcohol   Comment: once a month or less    Family History  Problem Relation Age of Onset   Heart disease Mother    Heart disease Father    Diabetes Father    Marfan syndrome Sister    Heart disease Sister        valve replacement   Heart disease Sister        pacemaker   Aortic aneurysm Sister    Peripheral Artery Disease Sister        amputation of toes   Breast cancer Sister 64   Heart disease Sister        CABG at 46   Colon cancer Sister 70   Heart disease Sister        CABG   Heart disease Sister    Heart disease Sister    Hyperlipidemia Sister    Stroke Sister        in 1948, mild   Heart disease Brother    Diabetes Brother    Heart disease Brother    Diabetes Brother    Heart disease Brother    Heart disease Brother    Heart disease Brother        CABG   Heart disease Brother        CABG   Aortic aneurysm Brother 34       repaired (at level of kidney)   Heart disease Brother        stent    Review of Systems: The review of systems is per the HPI.  All other systems were reviewed and are negative.  Physical Exam: There were no vitals taken for this visit. GENERAL:  [name] in NAD HEENT:  PERRL, EOMI, sclera are clear. Oropharynx is clear. NECK:  No jugular venous distention, carotid upstroke brisk and symmetric, no bruits, no thyromegaly or adenopathy LUNGS:  Clear to auscultation bilaterally CHEST:  Unremarkable HEART:  RRR,  PMI not displaced or sustained,S1 and S2 within normal limits, no S3, no S4: no clicks, no rubs, no murmurs ABD:  Soft, nontender. BS +, no masses or bruits. No hepatomegaly, no splenomegaly EXT:  2 + pulses  throughout, no edema, no cyanosis no clubbing SKIN:  Warm and dry.  No rashes  NEURO:  Alert and oriented x 3. Cranial nerves II through XII intact. PSYCH:  Cognitively intact    LABORATORY DATA:     Lab Results  Component Value Date   WBC 3.5 04/14/2022   HGB 11.8 04/14/2022   HCT 35.9 04/14/2022   PLT 222 04/14/2022   GLUCOSE 95 04/14/2022   CHOL 133 04/14/2022   TRIG 120 04/14/2022   HDL 52 04/14/2022   LDLCALC 60 04/14/2022   ALT 21 04/14/2022   AST 23 04/14/2022   NA 137 04/14/2022   K 4.2 04/14/2022   CL 100 04/14/2022   CREATININE 0.77 04/14/2022   BUN 12 04/14/2022   CO2 22 04/14/2022   TSH 3.850 05/28/2022   HGBA1C 5.4 09/24/2015     Echo 09/23/17: Study Conclusions   - Left ventricle: The cavity size was normal. Wall thickness was   normal. Systolic function was normal. The estimated ejection   fraction was in the range of 60% to 65%. Doppler parameters are   consistent with abnormal left ventricular relaxation (grade 1   diastolic dysfunction). The Ee&' ratio is between 8-15, suggesting   indeterminate LV filling pressure. - Left atrium: The atrium was normal in size. - Tricuspid valve: There was mild regurgitation. - Pulmonary arteries: PA peak pressure: 27 mm Hg (S). - Inferior vena cava: The vessel was normal in size. The   respirophasic diameter changes were in the normal range (>= 50%),   consistent with normal central venous pressure.   Impressions:   - Compared to a prior study in 2013, the LVEF is unchanged. Left   and right atria measure normal in size.  Myoview 09/29/17: Study Highlights     Nuclear stress EF: 55%. No T wave inversion was noted during stress. There was no ST segment deviation noted during stress. The study is normal. This is a low risk study.   Normal perfusion. LVEF 55% with normal wall motion. This is a low risk study. No change compared to prior study.      Assessment / Plan:  1. Obstructive sleep apnea. On  CPAP therapy  2. PAF - treated with rate control with metoprolol and Eliquis.  Now in NSR on exam.  Continue current therapy  3. Hypercholesterolemia. Last lipid panel showed excellent LDL of 66 on Crestor.  4. Hypothyroidism. TSH normal.   Follow up in 6 months.

## 2022-09-09 ENCOUNTER — Ambulatory Visit: Payer: Medicare Other | Admitting: Cardiology

## 2022-09-09 ENCOUNTER — Encounter: Payer: Self-pay | Admitting: Cardiology

## 2022-09-09 VITALS — BP 104/58 | HR 56 | Ht <= 58 in | Wt 119.8 lb

## 2022-09-09 DIAGNOSIS — I480 Paroxysmal atrial fibrillation: Secondary | ICD-10-CM

## 2022-09-09 DIAGNOSIS — E782 Mixed hyperlipidemia: Secondary | ICD-10-CM | POA: Diagnosis not present

## 2022-09-09 DIAGNOSIS — G4733 Obstructive sleep apnea (adult) (pediatric): Secondary | ICD-10-CM | POA: Diagnosis not present

## 2022-09-09 NOTE — Patient Instructions (Signed)
Medication Instructions:  Continue same medications *If you need a refill on your cardiac medications before your next appointment, please call your pharmacy*   Lab Work: None ordered   Testing/Procedures: None ordered   Follow-Up: At [facility], you and your health needs are our priority.  As part of our continuing mission to provide you with exceptional heart care, we have created designated Provider Care Teams.  These Care Teams include your primary Cardiologist (physician) and Advanced Practice Providers (APPs -  Physician Assistants and Nurse Practitioners) who all work together to provide you with the care you need, when you need it.  We recommend signing up for the patient portal called "MyChart".  Sign up information is provided on this After Visit Summary.  MyChart is used to connect with patients for Virtual Visits (Telemedicine).  Patients are able to view lab/test results, encounter notes, upcoming appointments, etc.  Non-urgent messages can be sent to your provider as well.   To learn more about what you can do with MyChart, go to [email or web address].    Your next appointment:  6 months    Call in Sept to schedule Jan appointment     Provider:  Dr.Jordan   

## 2022-09-11 DIAGNOSIS — R350 Frequency of micturition: Secondary | ICD-10-CM | POA: Diagnosis not present

## 2022-09-16 DIAGNOSIS — R350 Frequency of micturition: Secondary | ICD-10-CM | POA: Diagnosis not present

## 2022-09-25 DIAGNOSIS — R350 Frequency of micturition: Secondary | ICD-10-CM | POA: Diagnosis not present

## 2022-09-25 DIAGNOSIS — N3946 Mixed incontinence: Secondary | ICD-10-CM | POA: Diagnosis not present

## 2022-10-02 IMAGING — MG MM DIGITAL SCREENING BILAT W/ TOMO AND CAD
8 series · 8 of 24 positions shown · non-contrast
Comparison: Previous exam(s).

CLINICAL DATA: Screening.

EXAM:
DIGITAL SCREENING BILATERAL MAMMOGRAM WITH TOMOSYNTHESIS AND CAD
TECHNIQUE: Bilateral screening digital craniocaudal and mediolateral oblique
mammograms were obtained. Bilateral screening digital breast
tomosynthesis was performed. The images were evaluated with
computer-aided detection.

[L CC synth-2D]
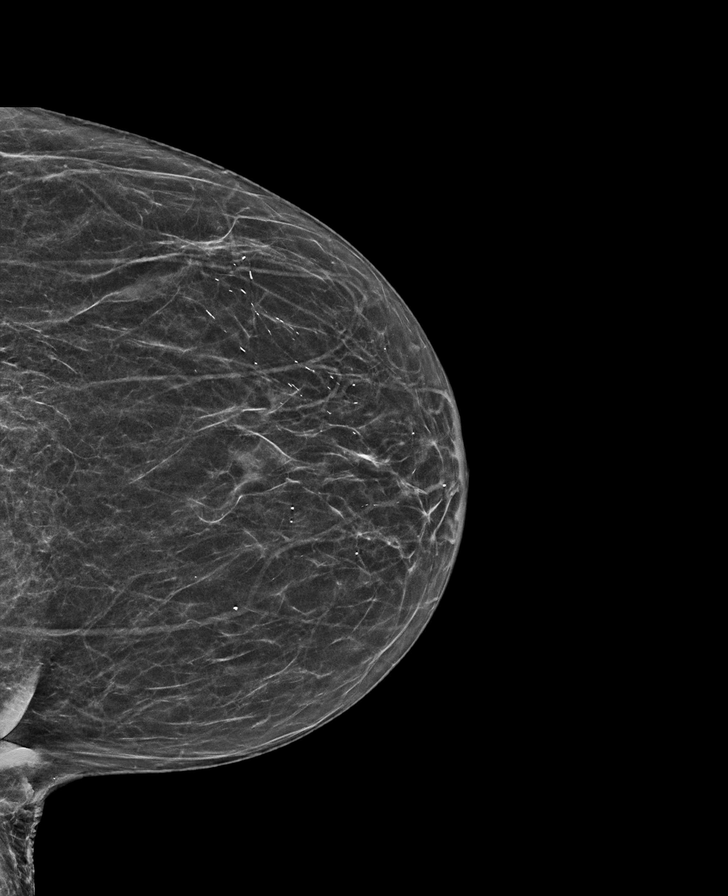

[R MLO synth-2D]
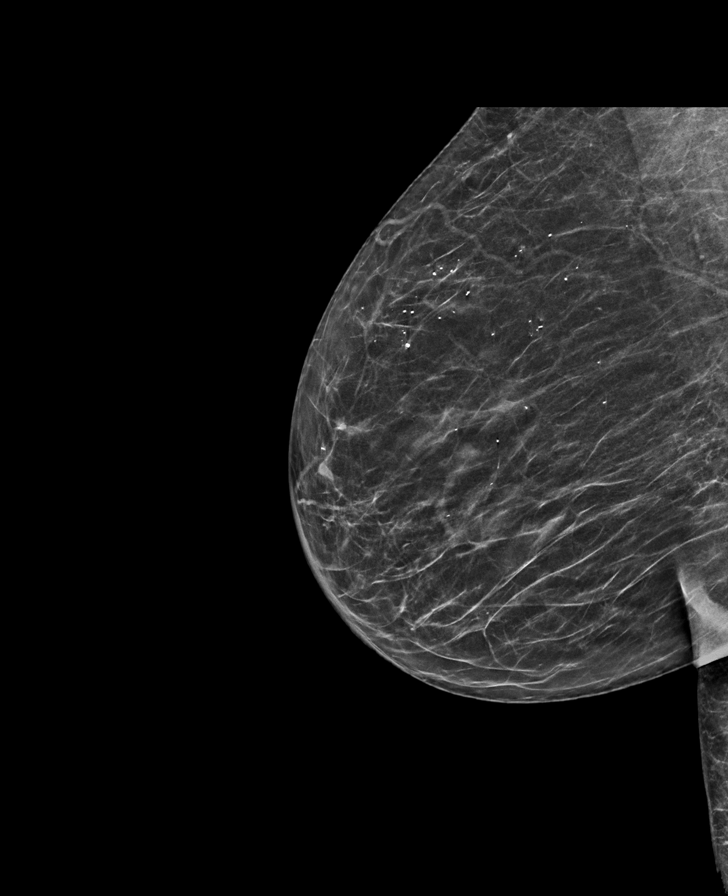

[R CC synth-2D]
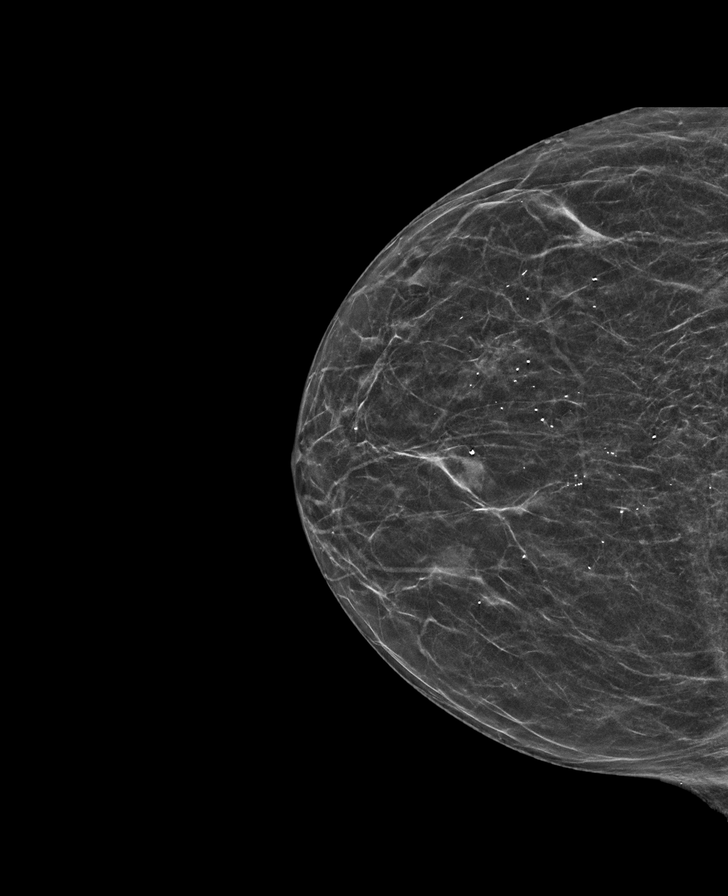

[L MLO synth-2D]
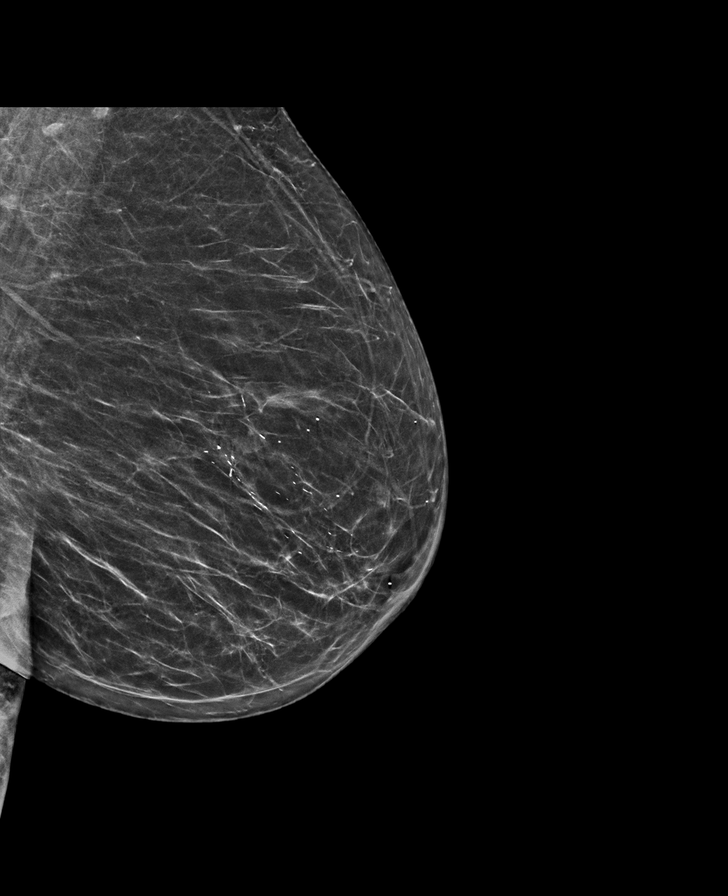

[L MLO tomo · tomo slice 31/62.0]
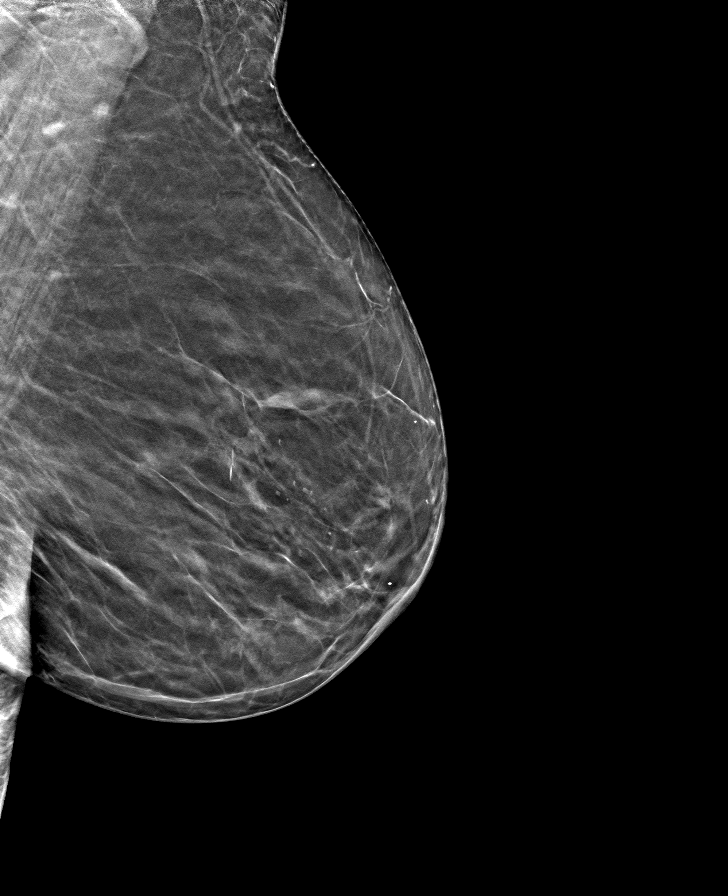

[R CC tomo · tomo slice 26/51.0]
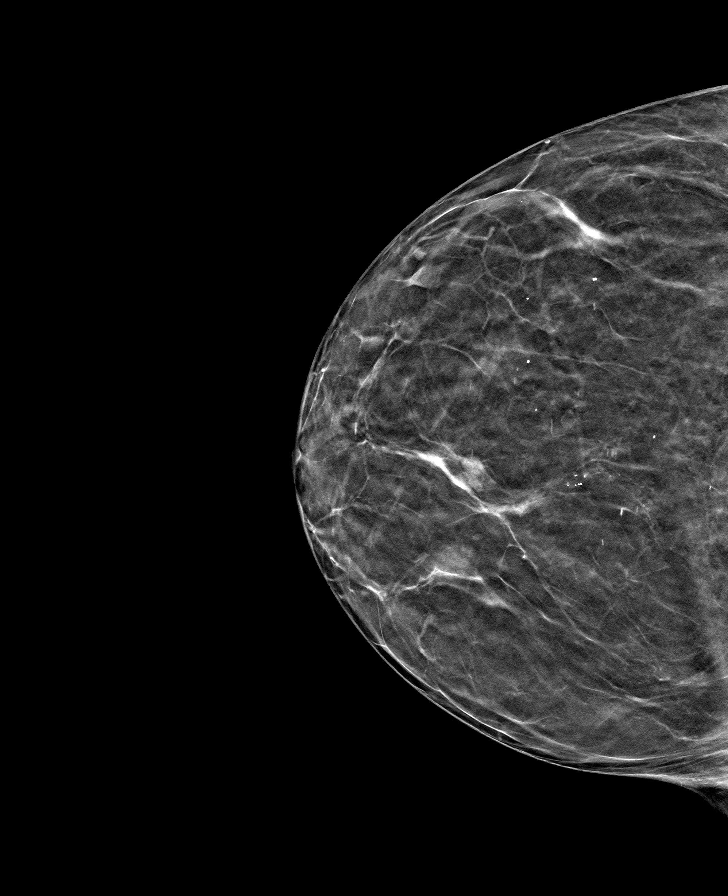

[R MLO tomo · tomo slice 30/59.0]
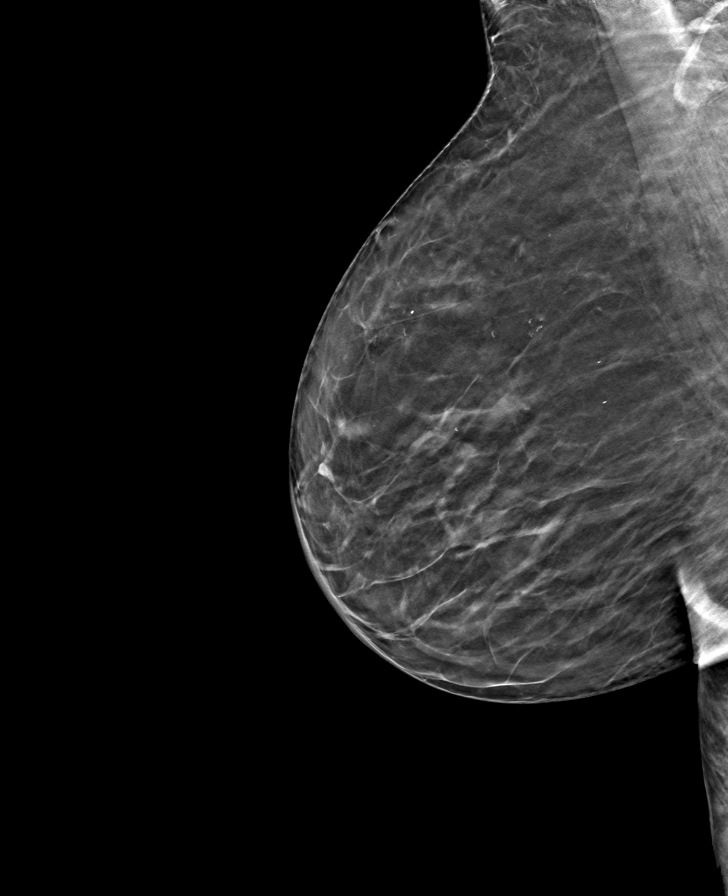

[L CC tomo · tomo slice 27/54.0]
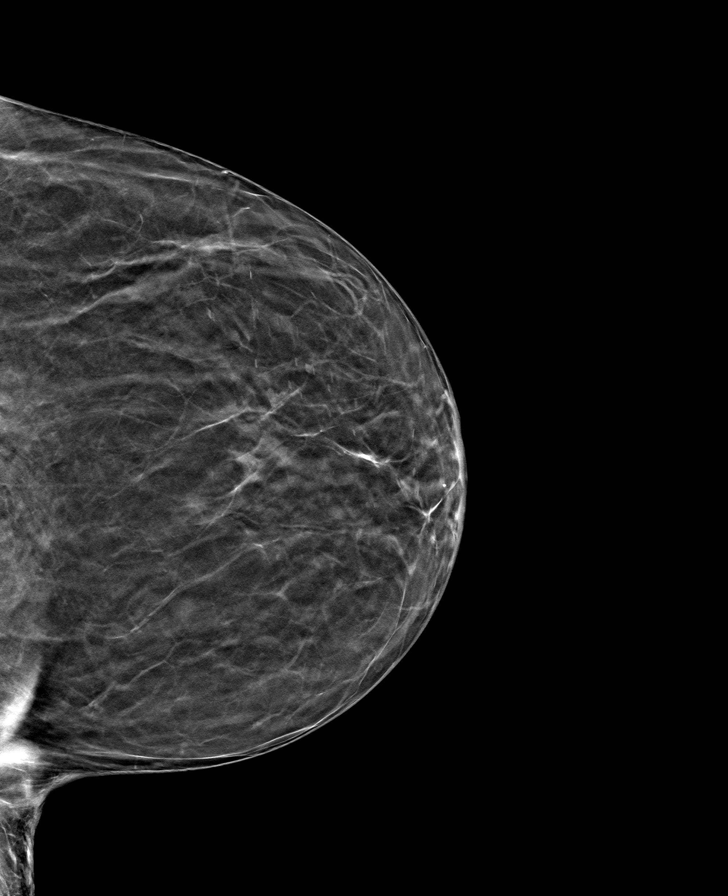

[8 of 24 positions shown; findings below may reference images not displayed]

ACR Breast Density Category b: There are scattered areas of
fibroglandular density.
FINDINGS: There are no findings suspicious for malignancy.
IMPRESSION: No mammographic evidence of malignancy. A result letter of this
screening mammogram will be mailed directly to the patient.

RECOMMENDATION:
Screening mammogram in one year. (Code:51-O-LD2)

BI-RADS CATEGORY  1: Negative.

## 2022-10-06 ENCOUNTER — Telehealth: Payer: Self-pay | Admitting: Internal Medicine

## 2022-10-06 DIAGNOSIS — E039 Hypothyroidism, unspecified: Secondary | ICD-10-CM

## 2022-10-06 DIAGNOSIS — Z5181 Encounter for therapeutic drug level monitoring: Secondary | ICD-10-CM

## 2022-10-06 DIAGNOSIS — Z7901 Long term (current) use of anticoagulants: Secondary | ICD-10-CM

## 2022-10-06 NOTE — Telephone Encounter (Signed)
Pt is scheduled °

## 2022-10-06 NOTE — Telephone Encounter (Signed)
Orders entered. She does NOT need to fast (only needs TSH and CBC)

## 2022-10-06 NOTE — Telephone Encounter (Signed)
Pt called and would like to come in for labs prior to appointment 8/15. I do not see any lab orders in

## 2022-10-07 ENCOUNTER — Other Ambulatory Visit: Payer: Self-pay | Admitting: Cardiology

## 2022-10-13 ENCOUNTER — Other Ambulatory Visit: Payer: Medicare Other

## 2022-10-13 DIAGNOSIS — Z5181 Encounter for therapeutic drug level monitoring: Secondary | ICD-10-CM | POA: Diagnosis not present

## 2022-10-13 DIAGNOSIS — Z7901 Long term (current) use of anticoagulants: Secondary | ICD-10-CM | POA: Diagnosis not present

## 2022-10-13 DIAGNOSIS — E039 Hypothyroidism, unspecified: Secondary | ICD-10-CM | POA: Diagnosis not present

## 2022-10-14 DIAGNOSIS — D6869 Other thrombophilia: Secondary | ICD-10-CM | POA: Insufficient documentation

## 2022-10-14 NOTE — Progress Notes (Unsigned)
No chief complaint on file.   Abigail Day is a 89 y.o. female who presents for Medicare annual wellness visit and follow-up on chronic medical conditions.   She has the following concerns:  Hypothyroidism:  She takes her medication on an empty stomach, separate from food and other medications.  She is compliant with taking Synthroid. Current dose is 100 mcg on Monday/Wednesday/Friday, and 50 mcg the other 4 days of the week (dose increased from 1 pill Monday through Friday, and 2 pills on Sat and Sunday when TSH was elevated in 04/2022). Recheck on this dose was 3.850 in 05/2022.  She had TSH rechecked prior to appointment, and remains at goal: Lab Results  Component Value Date   TSH 1.240 10/13/2022   She denies missed pills. Denies thinning of hair, nails aren't as brittle (as when TSH was higher).  No changes to bowels (sometimes loose after dinner).   Weight/energy?    Mixed hyperlipidemia:  She is compliant with taking Crestor, and denies side effects (previously took pravastatin which wasn't getting her to goal for TG; previously didn't tolerate lipitor) She continues on fish oil (2/d),  and tries to follow lowfat, low cholesterol diet. She is also taking Coenzyme Q10 daily.   Lipids were at goal on this regimen on last check:  Lab Results  Component Value Date   CHOL 133 04/14/2022   HDL 52 04/14/2022   LDLCALC 60 04/14/2022   TRIG 120 04/14/2022   CHOLHDL 2.6 04/14/2022     Atrial fibrillation:  She remains on Eliquis without any bleeding or complications, and is on metoprolol for rate control.  She sees Dr. [name] every 6 months, last seen in July, and was in NSR at that time.  She reports she can't always tell when she is in afib.  Some mornings she notes some palpitations, short-lived. No associated SOB or CP.  Only has DOE with rushing, walking much faster, and with stairs. Denies any change/worsening in her shortness of breath.  Lab Results  Component Value Date   WBC  5.0 10/13/2022   HGB 11.8 10/13/2022   HCT 35.9 10/13/2022   MCV 99 (H) 10/13/2022   PLT 220 10/13/2022      HTN:  Compliant with medications.  Denies headaches, dizziness, chest pain, shortness of breath.  BP Readings from Last 3 Encounters:  09/09/22 (!) 104/58  04/16/22 122/62  03/06/22 123/78     OSA:  She is using CPAP, and doing well, under the care of Dr. Craige Cotta.    Osteopenia--She took [place] through the end of June 2021 (had taken off/on, for total of about 5 years) Last DEXA was in 12/2020, stable compared to prior study, with T-1.6 at L femoral neck. She continues to take Calcium and D (at lunch). She goes to the gym 2-3x/week, and uses weights. Does chair yoga 3x/week.   She has h/o back pain, previously saw Dr. Shon Baton, Dr. Ethelene Hal. She had physical therapy, which helped.  She is doing home exercises, and chair yoga, both of which help. Also has h/o hip bursitis, periodically gets injections from ortho.   She hasn't had a shot in a long time, doesn't feel like she needs one.   Allergies--She uses claritin daily, and flonase just sporadically, very intermittently. She uses saline spray twice daily.   Hoarseness:  This is stable. She previously saw Dr. Suszanne Conners, diagnosed with laryngopharyngeal reflux, and PPI was increased, but she didn't see any benefit to her symptoms. She has undergone  rehab through [facility] lab.  She was told it was related to her thyroid treatment (from voice specialist), and was advised to stop the omeprazole entirely.  She remains off PPI and denies any recurrent reflux symptoms.  If she drinks a lot of water, her voice improves. She continues to do home exercises (not as regularly as she should). Hoarseness is worse in the  mornings improves with drinking water, and resolves later in the day.  Urinary incontinence (felt to be mixed, primarily OAB): She has been seeing urologist, and had complete evaluation.  She had 2 UTI's that were treated.  She was  tried on Myrbetriq (too costly), Gemtesa. She is currently on oxybutynin and percutaneous tibial nerve stimulation. She denies dysuria or abdominal pain.  Immunization History  Administered Date(s) Administered   COVID-19, mRNA, vaccine(Comirnaty)12 years and older 02/05/2022   Fluad Quad(high Dose 65+) 12/29/2018, 11/24/2019, 12/18/2020, 11/13/2021   Influenza Split 01/08/2011, 01/08/2012   Influenza, High Dose Seasonal PF 12/08/2012, 12/19/2013, 12/21/2014, 12/13/2015, 10/23/2016, 12/14/2017   Moderna Covid-19 Vaccine Bivalent Booster 63yrs & up 12/18/2020   Moderna SARS-COV2 Booster Vaccination 07/16/2020   Moderna Sars-Covid-2 Vaccination 03/07/2019, 04/04/2019, 01/10/2020   Pneumococcal Conjugate-13 03/27/2014   Pneumococcal Polysaccharide-23 01/31/2002, 01/08/2012, 10/09/2021   Td 10/01/2004   Tdap 01/08/2012, 04/21/2022   Zoster Recombinant(Shingrix) 07/07/2017, 10/18/2017   Zoster, Live 06/01/2008   Last Pap smear: n/a   Last mammogram: 05/2022 Last colonoscopy: 12/2015, negative biopsy.  No f/u needed Last DEXA:  12/2020 T-1.6 at L femoral neck. Ophtho: yearly Dentist: every 9 months   Exercise:  Chair yoga 2x/week, exercise room (cardio and weights for 45 mins) 3x/week. walking 30 minutes daily.    Patient Care Team: Joselyn Arrow, MD as PCP - General (Family Medicine) [name], Peter M, MD as PCP - Cardiology (Cardiology)  Dermatologist: Dr. Terri Piedra (retired) Ophtho: Dr. Dione Booze  Dentist: Dr. Luiz Blare Pulmonary (for OSA): Dr. Craige Cotta GI: Dr. Ewing Schlein ENT: Dr. Suszanne Conners Voice (speech pathologist) Hortense Ramal at [facility] Ortho: Dr. Shon Baton Pain: Dr. Ethelene Hal Urologist: Dr. McDiarmid, Dr. Sande Brothers  Depression Screening: Flowsheet Row Clinical Support from 04/16/2022 in [facility]  PHQ-2 Total Score 0        Falls screen:     04/16/2022   10:33 AM 10/09/2021    1:41 PM 03/27/2021    1:40 PM 09/19/2020    1:39 PM 09/14/2019    1:58 PM  Fall Risk   Falls in the past  year? 0 0 0 0 0  Number falls in past yr: 0 0 0 0   Injury with Fall? 0 0 0 0   Risk for fall due to : No Fall Risks No Fall Risks No Fall Risks No Fall Risks   Follow up Falls evaluation completed Falls evaluation completed Falls evaluation completed Falls evaluation completed      Functional Status Survey:        End of Life Discussion:  Patient has a living will and medical power of attorney, scanned   PMH, PSH, SH and FH reviewed and updated    ROS: The patient denies anorexia, fever, headaches, vision changes, decreased hearing, ear pain, sore throat, breast concerns, chest pain, syncope; denies cough, swelling, nausea, vomiting, constipation, abdominal pain, melena, hematochezia, indigestion/heartburn, dysphagia, vaginal bleeding, discharge, odor or itch, genital lesions, numbness, tingling, weakness, tremor, suspicious skin lesions, depression, anxiety, abnormal bleeding/bruising, or enlarged lymph nodes.   Some trouble remembering names, no changes. Hoarseness per HPI, chronic/unchanged Numbness in feet at  night.  Using topical creams help. Denies any pain. Diarrhea from certain foods--salad, gravy, "thickening", worse if dinner portions are larger.  Less diarrhea and incontinence since on oxybutynin. Denies dysuria, hematuria. Incontinence  UPDATE   PHYSICAL EXAM:  There were no vitals taken for this visit.  Wt Readings from Last 3 Encounters:  09/09/22 119 lb 12.8 oz (54.3 kg)  04/16/22 124 lb 6.4 oz (56.4 kg)  03/06/22 123 lb 9.6 oz (56.1 kg)    General Appearance:    Alert, cooperative, no distress, appears stated age.  Voice is hoarse, unchanged from prior visits  Head:    Normocephalic, without obvious abnormality, atraumatic     Eyes:    PERRL, conjunctiva/corneas clear, EOM's intact, fundi benign     Ears:    Normal TM's and external ear canals     Nose:    Normal, no drainage or sinus tenderness  Throat:    Normal mucosa, no lesions. Upper dentures, partial  lowers  Neck:    Supple, no lymphadenopathy; thyroid: no enlargement/ tenderness/nodules; no carotid bruit or JVD     Back:    Spine nontender, no CVA tenderness. +kyphosis.   Lungs:    Clear to auscultation bilaterally without wheezes, rales or ronchi; respirations unlabored     Chest Wall:    No tenderness or deformity     Heart:    Regular rate and rhythm, S1 and S2 normal, no murmur, rub or gallop.   Breast Exam:    No tenderness, masses, or nipple discharge or inversion. No axillary lymphadenopathy     Abdomen:    Soft, nondistended, nontender, normoactive bowel sounds, no hepatosplenomegaly or masses.   Genitalia:    Normal external genitalia without lesions. BUS and vagina normal, atrophic changes noted; No abnormal vaginal discharge. Uterus and adnexa surgically absent, no masses. Pap not performed. Slightly tender anteriorly over bladder (full bladder; no prolapse)   Rectal:    Normal tone, no masses or tenderness; guaiac negative stool     Extremities:    No clubbing, cyanosis or edema. 2+ pulses.  Pulses:    2+ and symmetric all extremities     Skin:    Skin color, texture, turgor normal, no lesions or rash  Lymph nodes:    Cervical, supraclavicular, inguinal and axillary nodes normal     Neurologic:    CNII-XII intact, normal strength, sensation and gait; reflexes 2+ and symmetric throughout.  Alert and oriented.                Psych:    Normal mood, affect, hygiene and grooming  ***UPDATE IF PELVIC/RECTAL SKIPPED    ASSESSMENT/PLAN:   Can SKIP pelvic and rectal (seeing urologist). Needs breast exam  Did she get RSV? ORDER DEXA--[facility] ([facility]) ENTER FUTURE ORDERS FOR PRIOR TO 6 mos f/u visit--CBC, TSH, c-met, lipids    Discussed monthly self breast exams and yearly mammograms; at least 30 minutes of aerobic activity at least 5 days/week, weight-bearing exercise at least 2x/week; proper sunscreen use reviewed; healthy diet, including goals of calcium and  vitamin D intake and alcohol recommendations (less than or equal to 1 drink/day) reviewed; regular seatbelt use; changing batteries in smoke detectors. Immunization recommendations discussed--continue yearly high dose flu shots.  Updated bivalent COVID booster recommended when available in the Fall. RSV vaccination recommended, to get from the pharmacy. Colonoscopy recommendations reviewed, UTD, no longer needed DEXA is next due 12/2022   MOST form reviewed and updated, full  care.  F/u 6 months for med check.  Fasting labs prior CBC, TSH, c-met, lipids ***   Medicare Attestation I have personally reviewed: The patient's medical and social history Their use of alcohol, tobacco or illicit drugs Their current medications and supplements The patient's functional ability including ADLs,fall risks, home safety risks, cognitive, and hearing and visual impairment Diet and physical activities Evidence for depression or mood disorders  The patient's weight, height, BMI have been recorded in the chart.  I have made referrals, counseling, and provided education to the patient based on review of the above and I have provided the patient with a written personalized care plan for preventive services.

## 2022-10-14 NOTE — Patient Instructions (Incomplete)
HEALTH MAINTENANCE RECOMMENDATIONS:  It is recommended that you get at least 30 minutes of aerobic exercise at least 5 days/week (for weight loss, you may need as much as 60-90 minutes). This can be any activity that gets your heart rate up. This can be divided in 10-15 minute intervals if needed, but try and build up your endurance at least once a week.  Weight bearing exercise is also recommended twice weekly.  Eat a healthy diet with lots of vegetables, fruits and fiber.  "Colorful" foods have a lot of vitamins (ie green vegetables, tomatoes, red peppers, etc).  Limit sweet tea, regular sodas and alcoholic beverages, all of which has a lot of calories and sugar.  Up to 1 alcoholic drink daily may be beneficial for women (unless trying to lose weight, watch sugars).  Drink a lot of water.  Calcium recommendations are 1200-1500 mg daily (1500 mg for postmenopausal women or women without ovaries), and vitamin D 1000 IU daily.  This should be obtained from diet and/or supplements (vitamins), and calcium should not be taken all at once, but in divided doses.  Monthly self breast exams and yearly mammograms for women over the age of 47 is recommended.  Sunscreen of at least SPF 30 should be used on all sun-exposed parts of the skin when outside between the hours of 10 am and 4 pm (not just when at beach or pool, but even with exercise, golf, tennis, and yard work!)  Use a sunscreen that says "broad spectrum" so it covers both UVA and UVB rays, and make sure to reapply every 1-2 hours.  Remember to change the batteries in your smoke detectors when changing your clock times in the spring and fall. Carbon monoxide detectors are recommended for your home.  Use your seat belt every time you are in a car, and please drive safely and not be distracted with cell phones and texting while driving.   Abigail Day , Thank you for taking time to come for your Medicare Wellness Visit. I appreciate your ongoing  commitment to your health goals. Please review the following plan we discussed and let me know if I can assist you in the future.   This is a list of the screening recommended for you and due dates:  Health Maintenance  Topic Date Due   COVID-19 Vaccine (7 - 2023-24 season) 06/07/2022   Flu Shot  10/02/2022   Medicare Annual Wellness Visit  10/15/2023   DTaP/Tdap/Td vaccine (4 - Td or Tdap) 04/21/2032   Pneumonia Vaccine  Completed   DEXA scan (bone density measurement)  Completed   Zoster (Shingles) Vaccine  Completed   HPV Vaccine  Aged Out   Please get the RSV vaccine from the pharmacy in the Fall. Continue to get high dose flu shots yearly. Please get the updated COVID booster when it is available in the Fall.  You will be due for another bone density test the end of October.  Please contact the [facility] to schedule this.  You may restart your hair/skin/nail vitamins.  This does NOT affect your thyroid, it only affects the TEST.  You can take this vitamin, but remember to STOP IT A WEEK BEFORE ANY BLOOD TESTS FOR YOUR THYROID.  Consider taking plain Mucinex (guaifenesin)--this is an expectorant that can help keep the mucus thinner, and help prevent it from getting hung up in your throat.  Drinking plenty of water also helps with this.  You don't have any hip bursitis. Your  pain in the right leg is lower down in the thigh. Be sure to do the stretches you have previously been shown. If you have persistent pain in that leg, you may want to go back to the orthopedist. You can also try topical voltaren gel, or salonpas with lidocaine or biofreeze.

## 2022-10-15 ENCOUNTER — Ambulatory Visit (INDEPENDENT_AMBULATORY_CARE_PROVIDER_SITE_OTHER): Payer: Medicare Other | Admitting: Family Medicine

## 2022-10-15 ENCOUNTER — Encounter: Payer: Self-pay | Admitting: Family Medicine

## 2022-10-15 VITALS — BP 128/84 | HR 68 | Ht <= 58 in | Wt 119.6 lb

## 2022-10-15 DIAGNOSIS — I480 Paroxysmal atrial fibrillation: Secondary | ICD-10-CM | POA: Diagnosis not present

## 2022-10-15 DIAGNOSIS — M85852 Other specified disorders of bone density and structure, left thigh: Secondary | ICD-10-CM | POA: Diagnosis not present

## 2022-10-15 DIAGNOSIS — Z5181 Encounter for therapeutic drug level monitoring: Secondary | ICD-10-CM

## 2022-10-15 DIAGNOSIS — R490 Dysphonia: Secondary | ICD-10-CM

## 2022-10-15 DIAGNOSIS — E039 Hypothyroidism, unspecified: Secondary | ICD-10-CM

## 2022-10-15 DIAGNOSIS — Z0000 Encounter for general adult medical examination without abnormal findings: Secondary | ICD-10-CM

## 2022-10-15 DIAGNOSIS — G4733 Obstructive sleep apnea (adult) (pediatric): Secondary | ICD-10-CM | POA: Diagnosis not present

## 2022-10-15 DIAGNOSIS — E782 Mixed hyperlipidemia: Secondary | ICD-10-CM

## 2022-10-15 DIAGNOSIS — D6869 Other thrombophilia: Secondary | ICD-10-CM

## 2022-10-15 DIAGNOSIS — Z7901 Long term (current) use of anticoagulants: Secondary | ICD-10-CM

## 2022-10-15 MED ORDER — SYNTHROID 50 MCG PO TABS
ORAL_TABLET | ORAL | 3 refills | Status: DC
Start: 2022-10-15 — End: 2023-12-14

## 2022-10-15 NOTE — Assessment & Plan Note (Signed)
Adequately replaced. Continue 100 mcg (2 of the 50 mcg tablets) on MWF, and 50 mcg the other 4 days/week. Reviewed proper way to take. She may restart hair/skin/nail vitamins, but reminded her to stop them the week before thyroid blood tests.

## 2022-10-15 NOTE — Assessment & Plan Note (Signed)
Unchanged/stable.  Postnasal drainage and allergies are a factor.  Continue antihistamines.  She is limiting inhaled steroid per Dr. Craige Cotta (?if she had bleeding issues?) Discussed use of mucinex. Continue home exercises from speech therapist, and be sure to stay well hydrated.

## 2022-10-15 NOTE — Assessment & Plan Note (Signed)
S/p boniva x 5 years in the past.  Off meds since 08/2019.  Last DEXA 12/2020. Due for DEXA 12/2022, reminded to call [facility] to schedule. Discussed calcium, vitamin D and weight-bearing exercise

## 2022-10-15 NOTE — Assessment & Plan Note (Signed)
Under the care of cardiologist, on metoprolol and eliquis without complications.  Currently in NSR. Only occasional short-lived palpitations

## 2022-10-15 NOTE — Assessment & Plan Note (Signed)
Compliant with CPAP, under the care of Dr. Craige Cotta

## 2022-10-15 NOTE — Assessment & Plan Note (Signed)
At goal on last check. Continue lowfat, low cholesterol diet, rosuvastatin and fish oil.

## 2022-10-15 NOTE — Assessment & Plan Note (Signed)
Due to atrial fibrillation. Continue eliquis.

## 2022-10-23 DIAGNOSIS — R350 Frequency of micturition: Secondary | ICD-10-CM | POA: Diagnosis not present

## 2022-10-27 ENCOUNTER — Encounter: Payer: Self-pay | Admitting: *Deleted

## 2022-10-27 ENCOUNTER — Telehealth: Payer: Self-pay | Admitting: Family Medicine

## 2022-10-27 NOTE — Telephone Encounter (Signed)
Pt called  She is using [employer] Stool Stool softener is what she is using  She scheduled her bone density for February 28th, that is the first appointment that had

## 2022-11-20 DIAGNOSIS — R350 Frequency of micturition: Secondary | ICD-10-CM | POA: Diagnosis not present

## 2022-12-11 DIAGNOSIS — Z23 Encounter for immunization: Secondary | ICD-10-CM | POA: Diagnosis not present

## 2022-12-18 DIAGNOSIS — R350 Frequency of micturition: Secondary | ICD-10-CM | POA: Diagnosis not present

## 2023-01-15 DIAGNOSIS — R350 Frequency of micturition: Secondary | ICD-10-CM | POA: Diagnosis not present

## 2023-02-12 DIAGNOSIS — N3946 Mixed incontinence: Secondary | ICD-10-CM | POA: Diagnosis not present

## 2023-02-12 DIAGNOSIS — R350 Frequency of micturition: Secondary | ICD-10-CM | POA: Diagnosis not present

## 2023-02-13 DIAGNOSIS — Z23 Encounter for immunization: Secondary | ICD-10-CM | POA: Diagnosis not present

## 2023-02-19 NOTE — Progress Notes (Unsigned)
Abigail Day Date of Birth: 1934-01-07 Medical Record #[id number]  History of Present Illness: Mrs. Abigail Day is seen for follow up of Afib. She has a history of paroxysmal atrial fib - on Eliquis. EF is normal per echo back in December of 2013. Normal Myoview in June 2015. Other issues include HLD, hypothyroidism with prior ablation for hyperthyroidism, and diverticulosis. She is  on CPAP therapy for sleep apnea-followed by pulmonary. In 2019 she had some scapular pain and dyspnea. We repeated an Echo and Myoview study which were unremarkable.  On follow up today she does note she is doing well. Has bad back pain at times due to arthritis. This is the only time she gets SOB.  No chest pain. No edema.  She is on CPAP followed by Dr Craige Cotta. Last TSH in normal range.    Current Outpatient Medications on File Prior to Visit  Medication Sig Dispense Refill   apixaban (ELIQUIS) 2.5 MG TABS tablet Take 1 tablet (2.5 mg total) by mouth 2 (two) times daily. 180 tablet 3   Ascorbic Acid (VITAMIN C) 500 MG CHEW Chew 1 each by mouth daily.     Calcium Carbonate-Vitamin D (CALCIUM 600 + D PO) Take 2 tablets by mouth daily.     cholestyramine (QUESTRAN) 4 g packet Take 1 packet (4 g total) by mouth 3 (three) times daily with meals. 60 each 2   co-enzyme Q-10 30 MG capsule Take 30 mg by mouth daily.     Docusate Calcium (STOOL SOFTENER PO) Take 1 capsule by mouth daily.     fluticasone (FLONASE) 50 MCG/ACT nasal spray Place 2 sprays into both nostrils daily. 48 g 0   loperamide (IMODIUM A-D) 2 MG tablet Take 2 mg by mouth 4 (four) times daily as needed for diarrhea or loose stools.     loratadine (CLARITIN) 10 MG tablet Take 10 mg by mouth daily.     metoprolol tartrate (LOPRESSOR) 25 MG tablet Take 1 tablet by mouth twice daily 180 tablet 3   mirabegron ER (MYRBETRIQ) 50 MG TB24 tablet Take 50 mg by mouth daily.     Misc Natural Products (OSTEO BI-FLEX JOINT SHIELD PO) Take 2 tablets by mouth daily.     Multiple  Vitamin (MULTI-VITAMIN) tablet Take 1 tablet by mouth daily.     Multiple Vitamins-Minerals (HAIR SKIN AND NAILS FORMULA PO) Take 2 capsules by mouth daily.     Multiple Vitamins-Minerals (ICAPS PO) Take 1 tablet by mouth daily.     Omega-3 Fatty Acids (FISH OIL OMEGA-3 PO) Take 2,400 mg by mouth daily.     PREVIDENT 5000 PLUS 1.1 % CREA dental cream See admin instructions.  3   Probiotic Product (PROBIOTIC PO) Take 1 capsule by mouth daily.     rosuvastatin (CRESTOR) 20 MG tablet Take 1 tablet (20 mg total) by mouth daily. 90 tablet 3   SYNTHROID 50 MCG tablet TAKE 1 tablet daily on Sun, Tues, Thurs, Sat, and 2 tabs ( ) on Mon/Wed/Fri 145 tablet 3   No current facility-administered medications on file prior to visit.    No Known Allergies  Past Medical History:  Diagnosis Date   Atrial fibrillation (HCC)    Diverticulosis    Dyspnea on exertion 02/18/2012   Eye exam abnormal 05/08/15   Dr.Groat-glaucoma suspect   Glaucoma suspect    Hemorrhoids    internal and external   Hemorrhoids 09/25/10   internal and external   Hyperplastic colon polyp 09/25/10   Dr.  Magod   Osteopenia    Palpitations 02/18/2012   Paroxysmal atrial fibrillation (HCC)    Pure hypercholesterolemia    Sleep apnea    Unspecified hypothyroidism    s/p ablation for hyperthyroidism   Urge urinary incontinence     Past Surgical History:  Procedure Laterality Date   ABDOMINAL HYSTERECTOMY  age 10   with BSO and bladder tack   COLONOSCOPY  11/06, 09/25/10, 12/2015   hyperplastic polyp 2012;    ESOPHAGOGASTRODUODENOSCOPY  09/25/10   tortuous esophagus, intact Nissen fundoplication, dilated the spasm at esophageal GE junction   EYE SURGERY     bilateral cataract surgery   LAPAROSCOPIC NISSEN FUNDOPLICATION  8/07   Dr. Daphine Deutscher   LARYNGOSCOPY  11/24/2017   dx.GERD,start omeprazole    Social History   Tobacco Use  Smoking Status Never  Smokeless Tobacco Never    Social History   Substance and  Sexual Activity  Alcohol Use Yes   Alcohol/week: 0.0 standard drinks of alcohol   Comment: once a month or less    Family History  Problem Relation Age of Onset   Heart disease Mother    Heart disease Father    Diabetes Father    Marfan syndrome Sister    Heart disease Sister        valve replacement   Heart disease Sister        pacemaker   Aortic aneurysm Sister    Peripheral Artery Disease Sister        amputation of toes   Breast cancer Sister 46   Heart disease Sister        CABG at 46   Colon cancer Sister 50   Heart disease Sister        CABG   Heart disease Sister    Heart disease Sister    Hyperlipidemia Sister    Stroke Sister        in 1948, mild   Heart disease Brother    Diabetes Brother    Heart disease Brother    Diabetes Brother    Heart disease Brother    Heart disease Brother    Heart disease Brother        CABG   Heart disease Brother        CABG   Aortic aneurysm Brother 53       repaired (at level of kidney)   Heart disease Brother        stent    Review of Systems: The review of systems is per the HPI.  All other systems were reviewed and are negative.  Physical Exam: BP 120/70   Pulse 60   Ht 4\' 10"  (1.473 m)   Wt 120 lb (54.4 kg)   SpO2 98%   BMI 25.08 kg/m  GENERAL:  [name] in NAD HEENT:  PERRL, EOMI, sclera are clear. Oropharynx is clear. NECK:  No jugular venous distention, carotid upstroke brisk and symmetric, no bruits, no thyromegaly or adenopathy LUNGS:  Clear to auscultation bilaterally CHEST:  Unremarkable HEART:  RRR,  PMI not displaced or sustained,S1 and S2 within normal limits, no S3, no S4: no clicks, no rubs, no murmurs ABD:  Soft, nontender. BS +, no masses or bruits. No hepatomegaly, no splenomegaly EXT:  2 + pulses throughout, no edema, no cyanosis no clubbing SKIN:  Warm and dry.  No rashes NEURO:  Alert and oriented x 3. Cranial nerves II through XII intact. PSYCH:  Cognitively  intact  LABORATORY DATA:        Lab Results  Component Value Date   WBC 5.0 10/13/2022   HGB 11.8 10/13/2022   HCT 35.9 10/13/2022   PLT 220 10/13/2022   GLUCOSE 95 04/14/2022   CHOL 133 04/14/2022   TRIG 120 04/14/2022   HDL 52 04/14/2022   LDLCALC 60 04/14/2022   ALT 21 04/14/2022   AST 23 04/14/2022   NA 137 04/14/2022   K 4.2 04/14/2022   CL 100 04/14/2022   CREATININE 0.77 04/14/2022   BUN 12 04/14/2022   CO2 22 04/14/2022   TSH 1.240 10/13/2022   HGBA1C 5.4 09/24/2015   Dated 08/13/22: creatinine 1.05, CMET otherwise normal.   Echo 09/23/17: Study Conclusions   - Left ventricle: The cavity size was normal. Wall thickness was   normal. Systolic function was normal. The estimated ejection   fraction was in the range of 60% to 65%. Doppler parameters are   consistent with abnormal left ventricular relaxation (grade 1   diastolic dysfunction). The Ee&' ratio is between 8-15, suggesting   indeterminate LV filling pressure. - Left atrium: The atrium was normal in size. - Tricuspid valve: There was mild regurgitation. - Pulmonary arteries: PA peak pressure: 27 mm Hg (S). - Inferior vena cava: The vessel was normal in size. The   respirophasic diameter changes were in the normal range (>= 50%),   consistent with normal central venous pressure.   Impressions:   - Compared to a prior study in 2013, the LVEF is unchanged. Left   and right atria measure normal in size.  Myoview 09/29/17: Study Highlights     Nuclear stress EF: 55%. No T wave inversion was noted during stress. There was no ST segment deviation noted during stress. The study is normal. This is a low risk study.   Normal perfusion. LVEF 55% with normal wall motion. This is a low risk study. No change compared to prior study.      Assessment / Plan:  1. Obstructive sleep apnea. On CPAP therapy  2. PAF - treated with rate control with metoprolol and Eliquis.  Now in NSR.  Continue current  therapy. Labs ok. On lower Eliquis dose due to age and weight.   3. Hypercholesterolemia. Last lipid panel showed excellent LDL of 60 on Crestor.  4. Hypothyroidism. TSH normal.   Follow up in 6 months.

## 2023-02-26 ENCOUNTER — Encounter: Payer: Self-pay | Admitting: Cardiology

## 2023-02-26 ENCOUNTER — Ambulatory Visit: Payer: Medicare Other | Attending: Cardiology | Admitting: Cardiology

## 2023-02-26 ENCOUNTER — Ambulatory Visit: Payer: Medicare Other | Admitting: Cardiology

## 2023-02-26 VITALS — BP 120/70 | HR 60 | Ht <= 58 in | Wt 120.0 lb

## 2023-02-26 DIAGNOSIS — E782 Mixed hyperlipidemia: Secondary | ICD-10-CM | POA: Diagnosis not present

## 2023-02-26 DIAGNOSIS — G4733 Obstructive sleep apnea (adult) (pediatric): Secondary | ICD-10-CM | POA: Insufficient documentation

## 2023-02-26 DIAGNOSIS — I480 Paroxysmal atrial fibrillation: Secondary | ICD-10-CM | POA: Insufficient documentation

## 2023-02-26 NOTE — Patient Instructions (Signed)
 Medication Instructions:  Continue same medications *If you need a refill on your cardiac medications before your next appointment, please call your pharmacy*   Lab Work: None ordered   Testing/Procedures: None ordered   Follow-Up: At [facility], you and your health needs are our priority.  As part of our continuing mission to provide you with exceptional heart care, we have created designated Provider Care Teams.  These Care Teams include your primary Cardiologist (physician) and Advanced Practice Providers (APPs -  Physician Assistants and Nurse Practitioners) who all work together to provide you with the care you need, when you need it.  We recommend signing up for the patient portal called "MyChart".  Sign up information is provided on this After Visit Summary.  MyChart is used to connect with patients for Virtual Visits (Telemedicine).  Patients are able to view lab/test results, encounter notes, upcoming appointments, etc.  Non-urgent messages can be sent to your provider as well.   To learn more about what you can do with MyChart, go to [email or web address].    Your next appointment:  6 months    Call in March to schedule June appointment     Provider:  Dr.Jordan

## 2023-03-20 DIAGNOSIS — R350 Frequency of micturition: Secondary | ICD-10-CM | POA: Diagnosis not present

## 2023-03-20 DIAGNOSIS — N3946 Mixed incontinence: Secondary | ICD-10-CM | POA: Diagnosis not present

## 2023-03-24 ENCOUNTER — Other Ambulatory Visit: Payer: Self-pay | Admitting: Cardiology

## 2023-03-24 NOTE — Telephone Encounter (Signed)
Prescription refill request for Eliquis received. Indication: PAF Last office visit: 02/26/23  P [name] MD Scr: 0.77 on 04/14/22  Epic Age: 89 Weight: 54.4kg  Based on above findings Eliquis 2.5mg  twice daily is the appropriate dose.  Refill approved.

## 2023-03-26 ENCOUNTER — Other Ambulatory Visit: Payer: Self-pay | Admitting: Family Medicine

## 2023-03-26 DIAGNOSIS — E782 Mixed hyperlipidemia: Secondary | ICD-10-CM

## 2023-04-10 ENCOUNTER — Telehealth: Payer: Self-pay | Admitting: *Deleted

## 2023-04-10 NOTE — Telephone Encounter (Signed)
 Called and spoke with patient, she states she got a new machine with the recall of the dream station.  She says they are never able to get a download prior to her appointment.  I advised her I would call [employer] and see what I could do.  I called [employer] and was told that all the machines have an SD card in them.  He said they can get a DL off of the SD card as well.  I let him know that we need the DL for her f/u and that I would call her and have her bring her machine Monday.  I attempted to call her back.  I left a detailed message (DPR) requesting that she bring her CPAP machine with her if she does not know where the SD card is located and we will get the card out and get a download.

## 2023-04-13 ENCOUNTER — Ambulatory Visit (INDEPENDENT_AMBULATORY_CARE_PROVIDER_SITE_OTHER): Payer: Medicare Other | Admitting: Adult Health

## 2023-04-13 ENCOUNTER — Encounter: Payer: Self-pay | Admitting: Adult Health

## 2023-04-13 VITALS — BP 126/60 | HR 75 | Ht 59.0 in | Wt 119.4 lb

## 2023-04-13 DIAGNOSIS — G4733 Obstructive sleep apnea (adult) (pediatric): Secondary | ICD-10-CM | POA: Diagnosis not present

## 2023-04-13 DIAGNOSIS — J310 Chronic rhinitis: Secondary | ICD-10-CM | POA: Diagnosis not present

## 2023-04-13 DIAGNOSIS — I480 Paroxysmal atrial fibrillation: Secondary | ICD-10-CM | POA: Diagnosis not present

## 2023-04-13 NOTE — Assessment & Plan Note (Signed)
 Continue follow-up with cardiology

## 2023-04-13 NOTE — Patient Instructions (Addendum)
 Continue on CPAP At bedtime, wear all night long  Add Saline nasal gel At bedtime   Activity as tolerated.  Do not drive if sleepy  Follow up in 1 year with Dr. Gaynell Keeler or Ameera Tigue NP

## 2023-04-13 NOTE — Assessment & Plan Note (Signed)
 Moderate obstructive sleep apnea with excellent control on CPAP.  Patient is encouraged to use nightly for at least 6 or more hours.  May use saline nasal gel for comfort.  Plan  Patient Instructions  Continue on CPAP At bedtime, wear all night long  Add Saline nasal gel At bedtime   Activity as tolerated.  Do not drive if sleepy  Follow up in 1 year with Dr. Gaynell Keeler or Xavyer Steenson NP

## 2023-04-13 NOTE — Addendum Note (Signed)
 Addended by: Shelle Devon on: 04/13/2023 05:07 PM   Modules accepted: Orders

## 2023-04-13 NOTE — Progress Notes (Signed)
 @Patient  ID: Abigail Day, female    DOB: 12-18-1933, 89 y.o.   MRN: [id number]  Chief Complaint  Patient presents with   Follow-up    Referring provider: Roosvelt Colla, MD  HPI:  89 year old female followed for obstructive sleep apnea  TEST/EVENTS :  PSG 08/18/12 >> AHI 23, SpO2 low 81%   Echo 09/23/17 >> EF 60 to 65%, grade 1 DD, mild TR   04/13/2023 Follow up: OSA  Patient presents for a 1 year follow-up.  Patient has moderate obstructive sleep apnea is on nocturnal CPAP.  Patient says she is doing fairly well on CPAP.  She tries to wear it every single night.  Occasionally has some issues with her mask and will take it off but for the most part gets in about 6 or 7 hours each night.  She does complain of a dry mouth.  Patient does have A-fib and is followed by cardiology.  CPAP download shows 88% compliance.  Daily average usage at 6 hours.  AHI 5/hour.  She is using a nasal mask.  She says she does have a Philips CPAP that was replaced due to the national recall.  She says she feels like the humidifier has not been using as much water lately.  Patient says overall she feels that she benefits from CPAP with decreased daytime sleepiness.  No Known Allergies  Immunization History  Administered Date(s) Administered   Fluad Quad(high Dose 65+) 12/29/2018, 11/24/2019, 12/18/2020, 11/13/2021   Influenza Split 01/08/2011, 01/08/2012   Influenza, High Dose Seasonal PF 12/08/2012, 12/19/2013, 12/21/2014, 12/13/2015, 10/23/2016, 12/14/2017   Moderna Covid-19 Vaccine Bivalent Booster 44yrs & up 12/18/2020   Moderna SARS-COV2 Booster Vaccination 07/16/2020   Moderna Sars-Covid-2 Vaccination 03/07/2019, 04/04/2019, 01/10/2020   Pfizer(Comirnaty)Fall Seasonal Vaccine 12 years and older 02/05/2022, 01/02/2023   Pneumococcal Conjugate-13 03/27/2014   Pneumococcal Polysaccharide-23 01/31/2002, 01/08/2012, 10/09/2021   Td 10/01/2004   Tdap 01/08/2012, 04/21/2022   Zoster Recombinant(Shingrix)  07/07/2017, 10/18/2017   Zoster, Live 06/01/2008    Past Medical History:  Diagnosis Date   Atrial fibrillation (HCC)    Diverticulosis    Dyspnea on exertion 02/18/2012   Eye exam abnormal 05/08/15   Dr.Groat-glaucoma suspect   Glaucoma suspect    Hemorrhoids    internal and external   Hemorrhoids 09/25/10   internal and external   Hyperplastic colon polyp 09/25/10   Dr. Lavaughn [place]   Osteopenia    Palpitations 02/18/2012   Paroxysmal atrial fibrillation (HCC)    Pure hypercholesterolemia    Sleep apnea    Unspecified hypothyroidism    s/p ablation for hyperthyroidism   Urge urinary incontinence     Tobacco History: Social History   Tobacco Use  Smoking Status Never  Smokeless Tobacco Never   Counseling given: Not Answered   Outpatient Medications Prior to Visit  Medication Sig Dispense Refill   apixaban  (ELIQUIS ) 2.5 MG TABS tablet Take 1 tablet by mouth twice daily 180 tablet 1   Ascorbic Acid (VITAMIN C) 500 MG CHEW Chew 1 each by mouth daily.     Calcium  Carbonate-Vitamin D (CALCIUM  600 + D PO) Take 2 tablets by mouth daily.     cholestyramine  (QUESTRAN ) 4 g packet Take 1 packet (4 g total) by mouth 3 (three) times daily with meals. 60 each 2   co-enzyme Q-10 30 MG capsule Take 30 mg by mouth daily.     Docusate Calcium  (STOOL SOFTENER PO) Take 1 capsule by mouth daily.     fluticasone  (FLONASE )  50 MCG/ACT nasal spray Place 2 sprays into both nostrils daily. 48 g 0   loperamide (IMODIUM A-D) 2 MG tablet Take 2 mg by mouth 4 (four) times daily as needed for diarrhea or loose stools.     loratadine (CLARITIN) 10 MG tablet Take 10 mg by mouth daily.     metoprolol  tartrate (LOPRESSOR ) 25 MG tablet Take 1 tablet by mouth twice daily 180 tablet 3   mirabegron ER (MYRBETRIQ) 50 MG TB24 tablet Take 50 mg by mouth daily.     Misc Natural Products (OSTEO BI-FLEX JOINT SHIELD PO) Take 2 tablets by mouth daily.     Multiple Vitamin (MULTI-VITAMIN) tablet Take 1 tablet by mouth  daily.     Multiple Vitamins-Minerals (HAIR SKIN AND NAILS FORMULA PO) Take 2 capsules by mouth daily.     Multiple Vitamins-Minerals (ICAPS PO) Take 1 tablet by mouth daily.     Omega-3 Fatty Acids (FISH OIL OMEGA-3 PO) Take 2,400 mg by mouth daily.     PREVIDENT 5000 PLUS 1.1 % CREA dental cream See admin instructions.  3   Probiotic Product (PROBIOTIC PO) Take 1 capsule by mouth daily.     rosuvastatin  (CRESTOR ) 20 MG tablet Take 1 tablet by mouth once daily 90 tablet 0   SYNTHROID  50 MCG tablet TAKE 1 tablet daily on Sun, Tues, Thurs, Sat, and 2 tabs (100mcg) on Mon/Wed/Fri 145 tablet 3   No facility-administered medications prior to visit.     Review of Systems:   Constitutional:   No  weight loss, night sweats,  Fevers, chills, fatigue, or  lassitude.  HEENT:   No headaches,  Difficulty swallowing,  Tooth/dental problems, or  Sore throat,                No sneezing, itching, ear ache, nasal congestion, post nasal drip,   CV:  No chest pain,  Orthopnea, PND, swelling in lower extremities, anasarca, dizziness, palpitations, syncope.   GI  No heartburn, indigestion, abdominal pain, nausea, vomiting, diarrhea, change in bowel habits, loss of appetite, bloody stools.   Resp: No shortness of breath with exertion or at rest.  No excess mucus, no productive cough,  No non-productive cough,  No coughing up of blood.  No change in color of mucus.  No wheezing.  No chest wall deformity  Skin: no rash or lesions.  GU: no dysuria, change in color of urine, no urgency or frequency.  No flank pain, no hematuria   MS:  No joint pain or swelling.  No decreased range of motion.  No back pain.    Physical Exam  BP 126/60 (BP Location: Left Arm, Patient Position: Sitting, Cuff Size: Normal)   Pulse 75   Ht 4\' 11"  (1.499 m)   Wt 119 lb 6.4 oz (54.2 kg)   SpO2 97%   BMI 24.12 kg/m   GEN: A/Ox3; pleasant , NAD, well nourished    HEENT:  [place]/AT,  NOSE-clear, THROAT-clear, no lesions, no  postnasal drip or exudate noted.   NECK:  Supple w/ fair ROM; no JVD; normal carotid impulses w/o bruits; no thyromegaly or nodules palpated; no lymphadenopathy.    RESP  Clear  P & A; w/o, wheezes/ rales/ or rhonchi. no accessory muscle use, no dullness to percussion  CARD:  RRR, no m/r/g, no peripheral edema, pulses intact, no cyanosis or clubbing.  GI:   Soft & nt; nml bowel sounds; no organomegaly or masses detected.   Musco: Warm bil, no deformities or  joint swelling noted.   Neuro: alert, no focal deficits noted.    Skin: Warm, no lesions or rashes    Lab Results:  CBC     BNP No results found for: "BNP"  ProBNP No results found for: "PROBNP"  Imaging: No results found.  Administration History     None           No data to display          No results found for: "NITRICOXIDE"      Assessment & Plan:   Obstructive sleep apnea Moderate obstructive sleep apnea with excellent control on CPAP.  Patient is encouraged to use nightly for at least 6 or more hours.  May use saline nasal gel for comfort.  Plan  Patient Instructions  Continue on CPAP At bedtime, wear all night long  Add Saline nasal gel At bedtime   Activity as tolerated.  Do not drive if sleepy  Follow up in 1 year with Dr. Gaynell Keeler or Malaya Cagley NP       Chronic rhinitis Nasal stuffiness and dryness-recommend saline nasal spray in the morning and saline nasal gel at bedtime   Atrial fibrillation Continue follow-up with cardiology     Roena Clark, NP 04/13/2023

## 2023-04-13 NOTE — Assessment & Plan Note (Signed)
 Nasal stuffiness and dryness-recommend saline nasal spray in the morning and saline nasal gel at bedtime

## 2023-04-15 ENCOUNTER — Other Ambulatory Visit: Payer: Medicare Other

## 2023-04-15 DIAGNOSIS — Z5181 Encounter for therapeutic drug level monitoring: Secondary | ICD-10-CM | POA: Diagnosis not present

## 2023-04-15 DIAGNOSIS — D6869 Other thrombophilia: Secondary | ICD-10-CM | POA: Diagnosis not present

## 2023-04-15 DIAGNOSIS — E782 Mixed hyperlipidemia: Secondary | ICD-10-CM

## 2023-04-15 DIAGNOSIS — E039 Hypothyroidism, unspecified: Secondary | ICD-10-CM | POA: Diagnosis not present

## 2023-04-15 LAB — LIPID PANEL

## 2023-04-16 DIAGNOSIS — R350 Frequency of micturition: Secondary | ICD-10-CM | POA: Diagnosis not present

## 2023-04-16 DIAGNOSIS — N3946 Mixed incontinence: Secondary | ICD-10-CM | POA: Diagnosis not present

## 2023-04-16 LAB — CBC WITH DIFFERENTIAL/PLATELET
Basophils Absolute: 0 10*3/uL (ref 0.0–0.2)
Basos: 1 %
EOS (ABSOLUTE): 0.1 10*3/uL (ref 0.0–0.4)
Eos: 4 %
Hematocrit: 37.1 % (ref 34.0–46.6)
Hemoglobin: 12.6 g/dL (ref 11.1–15.9)
Immature Grans (Abs): 0 10*3/uL (ref 0.0–0.1)
Immature Granulocytes: 0 %
Lymphocytes Absolute: 0.8 10*3/uL (ref 0.7–3.1)
Lymphs: 26 %
MCH: 34.1 pg — ABNORMAL HIGH (ref 26.6–33.0)
MCHC: 34 g/dL (ref 31.5–35.7)
MCV: 101 fL — ABNORMAL HIGH (ref 79–97)
Monocytes Absolute: 0.4 10*3/uL (ref 0.1–0.9)
Monocytes: 12 %
Neutrophils Absolute: 1.8 10*3/uL (ref 1.4–7.0)
Neutrophils: 57 %
Platelets: 219 10*3/uL (ref 150–450)
RBC: 3.69 x10E6/uL — ABNORMAL LOW (ref 3.77–5.28)
RDW: 12 % (ref 11.7–15.4)
WBC: 3.2 10*3/uL — ABNORMAL LOW (ref 3.4–10.8)

## 2023-04-16 LAB — CMP14+EGFR
ALT: 19 IU/L (ref 0–32)
AST: 23 IU/L (ref 0–40)
Albumin: 4.6 g/dL (ref 3.7–4.7)
Alkaline Phosphatase: 69 IU/L (ref 44–121)
BUN/Creatinine Ratio: 18 (ref 12–28)
BUN: 18 mg/dL (ref 8–27)
Bilirubin Total: 0.4 mg/dL (ref 0.0–1.2)
CO2: 23 mmol/L (ref 20–29)
Calcium: 10.2 mg/dL (ref 8.7–10.3)
Chloride: 100 mmol/L (ref 96–106)
Creatinine, Ser: 0.99 mg/dL (ref 0.57–1.00)
Globulin, Total: 2.1 g/dL (ref 1.5–4.5)
Glucose: 90 mg/dL (ref 70–99)
Potassium: 4.4 mmol/L (ref 3.5–5.2)
Sodium: 136 mmol/L (ref 134–144)
Total Protein: 6.7 g/dL (ref 6.0–8.5)
eGFR: 55 mL/min/{1.73_m2} — ABNORMAL LOW (ref >59)

## 2023-04-16 LAB — TSH: TSH: 1.35 u[IU]/mL (ref 0.450–4.500)

## 2023-04-16 LAB — LIPID PANEL
Cholesterol, Total: 157 mg/dL (ref 100–199)
HDL: 52 mg/dL (ref >39)
LDL CALC COMMENT:: 3 ratio (ref 0.0–4.4)
LDL Chol Calc (NIH): 80 mg/dL (ref 0–99)
Triglycerides: 147 mg/dL (ref 0–149)
VLDL Cholesterol Cal: 25 mg/dL (ref 5–40)

## 2023-04-19 NOTE — Progress Notes (Unsigned)
 No chief complaint on file.  Patient presents for 6 month follow-up on chronic problems. She had labs done prior to visit, see below.  Hypothyroidism:  She takes her medication on an empty stomach, separate from food and other medications.  She is compliant with taking Synthroid, and takes it properly. Current dose is 100 mcg on Monday/Wednesday/Friday, and 50 mcg the other 4 days of the week.  TSH has remained WNL on this dose.  Lab Results  Component Value Date   TSH 1.350 04/15/2023   She has some brittle nails, unchanged, and some mild constipation.  Denies changes to hair, mood, energy, weight.     Mixed hyperlipidemia:  She is compliant with taking Crestor, and denies side effects (previously took pravastatin which wasn't getting her to goal for TG; previously didn't tolerate lipitor) She continues on fish oil (2/d),  and tries to follow lowfat, low cholesterol diet. She is also taking Coenzyme Q10 daily.   Lipids remain at goal on this regimen:  Lab Results  Component Value Date   CHOL 157 04/15/2023   HDL 52 04/15/2023   LDLCALC 80 04/15/2023   TRIG 147 04/15/2023   CHOLHDL 3.0 04/15/2023       Atrial fibrillation:  She remains on Eliquis without any bleeding or complications, and is on metoprolol for rate control.  She sees Dr. [name] every 6 months, last seen in December, and was in NSR at that time.  She reports she can't always tell when she is in afib.  Some mornings she notes some palpitations, short-lived.  No associated SOB or CP. Only has DOE with rushing, walking much faster, and with stairs. Denies any change/worsening in her shortness of breath.     HTN:  Compliant with medications.  Denies headaches, dizziness, chest pain, shortness of breath, edema.  BP Readings from Last 3 Encounters:  04/13/23 126/60  02/26/23 120/70  10/15/22 128/84     OSA:  She is using CPAP, and doing well. She saw pulmonary for this earlier this month. She feels more refreshed in  the mornings, denies daytime somnolence.    She has h/o back pain, previously saw Dr. Shon Baton, Dr. Ethelene Hal. She had physical therapy, which helped.  She is doing home exercises, and chair yoga, both of which help. Also has h/o hip bursitis, periodically gets injections from ortho (and once here as well).  ***UPDATE    Urinary incontinence (felt to be mixed, primarily OAB): She is under the care of urology.  She was tried on Myrbetriq (too costly), Gemtesa. She has used oxybutynin and percutaneous tibial nerve stimulation. She finished a 12 week course of the PTNS, which helped.  She did a second course of this (April through December???)  ***UPDATE Taking oxybutynin??--constipation from it in past  She still has problems at night.  She denies dysuria or abdominal pain.   PMH, PSH, SH reviewed   ROS: No fever, chills, URI symptoms, cough, shortness of breath. No chest pain, shortness of breath, palpitations. Mild R hip pain, improved. No nausea or vomiting.  Gets occasional diarrhea after dinner, depending on what she eats. Denies any heartburn, dysphagia Urinary incontinence Denies dysuria, hematuria. Some stiffness in the morning Moods are good.    PHYSICAL EXAM:  There were no vitals taken for this visit.  Wt Readings from Last 3 Encounters:  04/13/23 119 lb 6.4 oz (54.2 kg)  02/26/23 120 lb (54.4 kg)  10/15/22 119 lb 9.6 oz (54.3 kg)    Well-appearing, pleasant  elderly female in no distress. Hoarse voice. HEENT: conjunctiva and sclera are clear, EOMI, OP clear Neck: no lymphadenopathy or mass, no bruit Heart: regular rate and rhythm, no murmur Lungs: clear bilaterally Abdomen: Soft, nontender, no organomegaly or mass. Back: no spinal or CVA tenderness Extremities: no edema, normal pulses. Neuro: alert and oriented. Normal strength, gait Psych: normal mood, affect, hygiene and grooming    ***UPDATE if heart is irreg Troch tenderness??    Chemistry       Component Value Date/Time   NA 136 04/15/2023 0850   K 4.4 04/15/2023 0850   CL 100 04/15/2023 0850   CO2 23 04/15/2023 0850   BUN 18 04/15/2023 0850   CREATININE 0.99 04/15/2023 0850   CREATININE 0.83 04/21/2016 0852      Component Value Date/Time   CALCIUM 10.2 04/15/2023 0850   ALKPHOS 69 04/15/2023 0850   AST 23 04/15/2023 0850   ALT 19 04/15/2023 0850   BILITOT 0.4 04/15/2023 0850     Fasting glucose 90  Lab Results  Component Value Date   TSH 1.350 04/15/2023   Lab Results  Component Value Date   WBC 3.2 (L) 04/15/2023   HGB 12.6 04/15/2023   HCT 37.1 04/15/2023   MCV 101 (H) 04/15/2023   PLT 219 04/15/2023   Lab Results  Component Value Date   CHOL 157 04/15/2023   HDL 52 04/15/2023   LDLCALC 80 04/15/2023   TRIG 147 04/15/2023   CHOLHDL 3.0 04/15/2023      ASSESSMENT/PLAN:   F/u 6 months for AWV/med check (can be nonfasting) Does NOT need labs prior--likely only needs CBC as screening; can check TSH only if having sx at that time, otherwise yearly.

## 2023-04-20 ENCOUNTER — Ambulatory Visit (INDEPENDENT_AMBULATORY_CARE_PROVIDER_SITE_OTHER): Payer: Medicare Other | Admitting: Family Medicine

## 2023-04-20 ENCOUNTER — Encounter: Payer: Self-pay | Admitting: Family Medicine

## 2023-04-20 VITALS — BP 120/72 | HR 64 | Ht 59.0 in | Wt 121.0 lb

## 2023-04-20 DIAGNOSIS — I480 Paroxysmal atrial fibrillation: Secondary | ICD-10-CM | POA: Diagnosis not present

## 2023-04-20 DIAGNOSIS — E890 Postprocedural hypothyroidism: Secondary | ICD-10-CM

## 2023-04-20 DIAGNOSIS — E039 Hypothyroidism, unspecified: Secondary | ICD-10-CM

## 2023-04-20 DIAGNOSIS — E782 Mixed hyperlipidemia: Secondary | ICD-10-CM | POA: Diagnosis not present

## 2023-04-20 DIAGNOSIS — R718 Other abnormality of red blood cells: Secondary | ICD-10-CM

## 2023-04-20 DIAGNOSIS — Z7901 Long term (current) use of anticoagulants: Secondary | ICD-10-CM | POA: Diagnosis not present

## 2023-04-20 DIAGNOSIS — R32 Unspecified urinary incontinence: Secondary | ICD-10-CM | POA: Diagnosis not present

## 2023-04-20 DIAGNOSIS — D6869 Other thrombophilia: Secondary | ICD-10-CM

## 2023-04-20 NOTE — Patient Instructions (Addendum)
 Continue to avoid all anti-inflammatories (advil/motrin/BC/Goody powder, aleve/naproxen). You shouldn't take these due to being on blood thinners, and also to keep your kidneys healthy. Be sure to stay well hydrated (to help your kidneys).  You may take Tylenol Arthritis twice daily if you need to for pain.  Continue your regular exercises and stretches. Continue your same medications. Cut back on the creamy foods (soups, sauces, etc)--this not only bothers your stomach, but affects your cholesterol.

## 2023-05-01 ENCOUNTER — Encounter: Payer: Self-pay | Admitting: Family Medicine

## 2023-05-01 ENCOUNTER — Ambulatory Visit
Admission: RE | Admit: 2023-05-01 | Discharge: 2023-05-01 | Disposition: A | Discharge status: Home / Self Care | Payer: Medicare Other | Source: Ambulatory Visit | Attending: Family Medicine | Admitting: Family Medicine

## 2023-05-01 DIAGNOSIS — M8588 Other specified disorders of bone density and structure, other site: Secondary | ICD-10-CM | POA: Diagnosis not present

## 2023-05-01 DIAGNOSIS — Z90722 Acquired absence of ovaries, bilateral: Secondary | ICD-10-CM | POA: Diagnosis not present

## 2023-05-01 DIAGNOSIS — M85852 Other specified disorders of bone density and structure, left thigh: Secondary | ICD-10-CM

## 2023-05-01 DIAGNOSIS — E2839 Other primary ovarian failure: Secondary | ICD-10-CM | POA: Diagnosis not present

## 2023-05-01 DIAGNOSIS — N958 Other specified menopausal and perimenopausal disorders: Secondary | ICD-10-CM | POA: Diagnosis not present

## 2023-05-14 DIAGNOSIS — N3946 Mixed incontinence: Secondary | ICD-10-CM | POA: Diagnosis not present

## 2023-05-14 DIAGNOSIS — R350 Frequency of micturition: Secondary | ICD-10-CM | POA: Diagnosis not present

## 2023-05-20 ENCOUNTER — Telehealth: Payer: Self-pay | Admitting: Family Medicine

## 2023-05-20 NOTE — Telephone Encounter (Signed)
 Copied from CRM [id number]. Topic: General - Billing Inquiry >> May 18, 2023  8:33 AM Abigail Day wrote: Reason for CRM: The patient saw Dr. Lynelle Doctor on 2/17 and received a bill on 3/11 for 68.63. The patient wants to know if her insurance was filed? The patient can be contacted through MyChart and by phone at [phone]

## 2023-06-11 DIAGNOSIS — R350 Frequency of micturition: Secondary | ICD-10-CM | POA: Diagnosis not present

## 2023-06-23 NOTE — Progress Notes (Unsigned)
 No chief complaint on file.   Husband has been very ill.  He was hospitalized last month with seizure. He has Afib, T2DM, seizures, urinary retention, aspiration PNA  and altered mental status.  He was discharged to be on hospice care at the nursing home at [facility]  Hypothyroidism:  She takes her medication on an empty stomach, separate from food and other medications. She is compliant with taking Synthroid , and takes it properly. Current dose is 100 mcg on Monday/Wednesday/Friday, and 50 mcg the other 4 days of the week.  TSH has remained WNL on this dose. No changes to hair/mood, energy, weight, bowels (controlled with stool softener daily). Brittle nails unchanged,   Lab Results  Component Value Date   TSH 1.350 04/15/2023     Atrial fibrillation, HTN-- On metoprolol .   PMH, PSH, SH reviewed   ROS:    PHYSICAL EXAM:  There were no vitals taken for this visit.      ASSESSMENT/PLAN:

## 2023-06-24 ENCOUNTER — Other Ambulatory Visit: Payer: Self-pay | Admitting: Family Medicine

## 2023-06-24 ENCOUNTER — Ambulatory Visit: Admitting: Family Medicine

## 2023-06-24 ENCOUNTER — Encounter: Payer: Self-pay | Admitting: Family Medicine

## 2023-06-24 VITALS — BP 118/68 | HR 56 | Ht 59.0 in | Wt 117.2 lb

## 2023-06-24 DIAGNOSIS — R197 Diarrhea, unspecified: Secondary | ICD-10-CM | POA: Diagnosis not present

## 2023-06-24 DIAGNOSIS — E890 Postprocedural hypothyroidism: Secondary | ICD-10-CM

## 2023-06-24 DIAGNOSIS — E782 Mixed hyperlipidemia: Secondary | ICD-10-CM

## 2023-06-24 DIAGNOSIS — Z7901 Long term (current) use of anticoagulants: Secondary | ICD-10-CM | POA: Diagnosis not present

## 2023-06-24 DIAGNOSIS — I480 Paroxysmal atrial fibrillation: Secondary | ICD-10-CM | POA: Diagnosis not present

## 2023-06-24 NOTE — Patient Instructions (Signed)
 Continue to use the Imodium if needed for diarrhea. Try and eat a high fiber diet (fruits, vegetables, and whole grains). Consider taking metamucil daily, ot help bulk up the stools.  I suspect your loose stools are related to your stress (possible irritable bowel syndrome). Continue to limit dairy (and/or use lactaid tablets when you have it).  Your stool was negative for blood today (and wasn't black). We are doing additional blood tests to ensure that nothing bad is going on (that your thyroid  is okay, electrolytes, liver, kidney, and blood counts).

## 2023-06-25 ENCOUNTER — Telehealth: Payer: Self-pay | Admitting: Cardiology

## 2023-06-25 ENCOUNTER — Telehealth: Payer: Self-pay | Admitting: *Deleted

## 2023-06-25 ENCOUNTER — Other Ambulatory Visit: Payer: Self-pay | Admitting: *Deleted

## 2023-06-25 ENCOUNTER — Encounter: Payer: Self-pay | Admitting: Family Medicine

## 2023-06-25 DIAGNOSIS — E782 Mixed hyperlipidemia: Secondary | ICD-10-CM

## 2023-06-25 LAB — CBC WITH DIFFERENTIAL/PLATELET
Basophils Absolute: 0 10*3/uL (ref 0.0–0.2)
Basos: 1 %
EOS (ABSOLUTE): 0.1 10*3/uL (ref 0.0–0.4)
Eos: 2 %
Hematocrit: 36.6 % (ref 34.0–46.6)
Hemoglobin: 12.3 g/dL (ref 11.1–15.9)
Immature Grans (Abs): 0 10*3/uL (ref 0.0–0.1)
Immature Granulocytes: 0 %
Lymphocytes Absolute: 1.1 10*3/uL (ref 0.7–3.1)
Lymphs: 22 %
MCH: 34 pg — ABNORMAL HIGH (ref 26.6–33.0)
MCHC: 33.6 g/dL (ref 31.5–35.7)
MCV: 101 fL — ABNORMAL HIGH (ref 79–97)
Monocytes Absolute: 0.5 10*3/uL (ref 0.1–0.9)
Monocytes: 10 %
Neutrophils Absolute: 3.3 10*3/uL (ref 1.4–7.0)
Neutrophils: 65 %
Platelets: 266 10*3/uL (ref 150–450)
RBC: 3.62 x10E6/uL — ABNORMAL LOW (ref 3.77–5.28)
RDW: 12.2 % (ref 11.7–15.4)
WBC: 5 10*3/uL (ref 3.4–10.8)

## 2023-06-25 LAB — COMPREHENSIVE METABOLIC PANEL WITH GFR
ALT: 27 IU/L (ref 0–32)
AST: 26 IU/L (ref 0–40)
Albumin: 4.5 g/dL (ref 3.7–4.7)
Alkaline Phosphatase: 67 IU/L (ref 44–121)
BUN/Creatinine Ratio: 22 (ref 12–28)
BUN: 15 mg/dL (ref 8–27)
Bilirubin Total: 0.3 mg/dL (ref 0.0–1.2)
CO2: 19 mmol/L — ABNORMAL LOW (ref 20–29)
Calcium: 9.8 mg/dL (ref 8.7–10.3)
Chloride: 93 mmol/L — ABNORMAL LOW (ref 96–106)
Creatinine, Ser: 0.69 mg/dL (ref 0.57–1.00)
Globulin, Total: 2.1 g/dL (ref 1.5–4.5)
Glucose: 99 mg/dL (ref 70–99)
Potassium: 4.4 mmol/L (ref 3.5–5.2)
Sodium: 128 mmol/L — ABNORMAL LOW (ref 134–144)
Total Protein: 6.6 g/dL (ref 6.0–8.5)
eGFR: 83 mL/min/{1.73_m2} (ref >59)

## 2023-06-25 LAB — TSH: TSH: 1.46 u[IU]/mL (ref 0.450–4.500)

## 2023-06-25 MED ORDER — ROSUVASTATIN CALCIUM 20 MG PO TABS: 20.0000 mg | ORAL_TABLET | Freq: Every day | ORAL | 0 refills | Status: DC

## 2023-06-25 NOTE — Telephone Encounter (Signed)
 Copied from CRM [id number]. Topic: Clinical - Prescription Issue >> Jun 25, 2023 12:44 PM Zipporah Him wrote: Reason for CRM: Patient states she was over yesterday and there was uncertainty if she needed cholesterol medicine refilled at this time, she states she does need a refill.  Refill sent to [facility].

## 2023-06-25 NOTE — Telephone Encounter (Signed)
 Pt called in asking to speak with Bartholomew Light, LPN. She didn't wish to go into detail.

## 2023-06-25 NOTE — Telephone Encounter (Signed)
 Called patient and patient verbalized husband just passed away. Her new plan is now called  [employer] silver script pharmacy program. Drug store are is no longer accepting [employer] discount card and now she has to pay 400 copay. Patient verbalized she needs prior auth for her Eliquis . Made patient aware this will be forward for advice. Patient verbalized an understanding.

## 2023-06-26 ENCOUNTER — Telehealth: Payer: Self-pay | Admitting: Pharmacy Technician

## 2023-06-26 ENCOUNTER — Other Ambulatory Visit (HOSPITAL_COMMUNITY): Payer: Self-pay

## 2023-06-26 NOTE — Telephone Encounter (Signed)
 PAP: Patient assistance application for Eliquis through [employer] ([employer]) has been mailed to pt's home address on file. Provider portion of application will be faxed to provider's office.

## 2023-06-26 NOTE — Telephone Encounter (Signed)
 Patient calling to speak with Dr. Christophe Cram nurse about the medication. Please advise

## 2023-06-26 NOTE — Telephone Encounter (Signed)
 Spoke to patient she stated her husband died recently.Stated he has a new bottle of Eliquis  5 mg tablets.She wanted to know if ok to break in half.She takes 2.5 mg twice a day.Advised I will send message to our Pharmacy team for advice.

## 2023-06-26 NOTE — Telephone Encounter (Signed)
Called patient left PharmD's advice on personal voice mail. 

## 2023-06-29 NOTE — Telephone Encounter (Signed)
 I spoke to the patient and explained everything again and she said she will fill out the application once received

## 2023-07-07 NOTE — Telephone Encounter (Signed)
 Helped client fill out SSA form- patient will mail back in the application while we wait on denial or approval from medicare low subsidy

## 2023-07-09 DIAGNOSIS — R350 Frequency of micturition: Secondary | ICD-10-CM | POA: Diagnosis not present

## 2023-07-13 NOTE — Telephone Encounter (Signed)
 Tried to call patient and no answer -still have not received application

## 2023-07-13 NOTE — Telephone Encounter (Signed)
 Spoke to patient and advised we have not received application yet. She said her nephew is going to send us  the application. She said once she gets the denial/approval from social security she will send it to us .

## 2023-07-23 ENCOUNTER — Other Ambulatory Visit: Payer: Self-pay | Admitting: Family Medicine

## 2023-07-23 DIAGNOSIS — Z1231 Encounter for screening mammogram for malignant neoplasm of breast: Secondary | ICD-10-CM

## 2023-08-06 ENCOUNTER — Ambulatory Visit

## 2023-08-06 DIAGNOSIS — R350 Frequency of micturition: Secondary | ICD-10-CM | POA: Diagnosis not present

## 2023-08-07 ENCOUNTER — Ambulatory Visit
Admission: RE | Admit: 2023-08-07 | Discharge: 2023-08-07 | Disposition: A | Discharge status: Home / Self Care | Source: Ambulatory Visit | Attending: Family Medicine | Admitting: Family Medicine

## 2023-08-07 DIAGNOSIS — Z1231 Encounter for screening mammogram for malignant neoplasm of breast: Secondary | ICD-10-CM | POA: Diagnosis not present

## 2023-08-10 NOTE — Telephone Encounter (Signed)
 Patient portion faxed

## 2023-08-14 DIAGNOSIS — H04123 Dry eye syndrome of bilateral lacrimal glands: Secondary | ICD-10-CM | POA: Diagnosis not present

## 2023-08-14 DIAGNOSIS — H43811 Vitreous degeneration, right eye: Secondary | ICD-10-CM | POA: Diagnosis not present

## 2023-08-14 DIAGNOSIS — H353131 Nonexudative age-related macular degeneration, bilateral, early dry stage: Secondary | ICD-10-CM | POA: Diagnosis not present

## 2023-08-14 DIAGNOSIS — H35371 Puckering of macula, right eye: Secondary | ICD-10-CM | POA: Diagnosis not present

## 2023-08-14 DIAGNOSIS — H02831 Dermatochalasis of right upper eyelid: Secondary | ICD-10-CM | POA: Diagnosis not present

## 2023-08-14 DIAGNOSIS — H401111 Primary open-angle glaucoma, right eye, mild stage: Secondary | ICD-10-CM | POA: Diagnosis not present

## 2023-08-14 DIAGNOSIS — H02834 Dermatochalasis of left upper eyelid: Secondary | ICD-10-CM | POA: Diagnosis not present

## 2023-08-21 NOTE — Telephone Encounter (Signed)
 Will forward this message to Dr. [name] and his nurse, to print off MD portion of pt assistance form in media, then sign/date thereafter.   Once MD portion signed/dated, please fax back to pt assistance team for further management.

## 2023-08-21 NOTE — Progress Notes (Unsigned)
 Abigail Day Date of Birth: 1933-04-04 Medical Record #[id number]  History of Present Illness: Abigail Day is seen for follow up of Afib. She has a history of paroxysmal atrial fib - on Eliquis . EF is normal per echo back in December of 2013. Normal Myoview  in June 2015. Other issues include HLD, hypothyroidism with prior ablation for hyperthyroidism, and diverticulosis. She is  on CPAP therapy for sleep apnea-followed by pulmonary. In 2019 she had some scapular pain and dyspnea. We repeated an Echo and Myoview  study which were unremarkable.  On follow up today she does note she is doing well. Has bad back pain at times due to arthritis. This is the only time she gets SOB.  No chest pain. No edema.  She is on CPAP followed by Dr Matilde Son. Last TSH in normal range.    Current Outpatient Medications on File Prior to Visit  Medication Sig Dispense Refill   acetaminophen (TYLENOL) 650 MG CR tablet Take 650 mg by mouth every 8 (eight) hours as needed for pain.     apixaban  (ELIQUIS ) 2.5 MG TABS tablet Take 1 tablet by mouth twice daily 180 tablet 1   Ascorbic Acid (VITAMIN C) 500 MG CHEW Chew 1 each by mouth daily.     Calcium  Carbonate-Vitamin D (CALCIUM  600 + D PO) Take 2 tablets by mouth daily.     co-enzyme Q-10 30 MG capsule Take 30 mg by mouth daily.     Docusate Calcium  (STOOL SOFTENER PO) Take 1 capsule by mouth daily. (Patient not taking: Reported on 06/24/2023)     fluticasone  (FLONASE ) 50 MCG/ACT nasal spray Place 2 sprays into both nostrils daily. 48 g 0   loperamide (IMODIUM A-D) 2 MG tablet Take 2 mg by mouth 4 (four) times daily as needed for diarrhea or loose stools.     loratadine (CLARITIN) 10 MG tablet Take 10 mg by mouth daily.     metoprolol  tartrate (LOPRESSOR ) 25 MG tablet Take 1 tablet by mouth twice daily 180 tablet 3   mirabegron ER (MYRBETRIQ) 50 MG TB24 tablet Take 50 mg by mouth daily.     Misc Natural Products (OSTEO BI-FLEX JOINT SHIELD PO) Take 2 tablets by mouth daily.      Multiple Vitamin (MULTI-VITAMIN) tablet Take 1 tablet by mouth daily.     Multiple Vitamins-Minerals (HAIR SKIN AND NAILS FORMULA PO) Take 2 capsules by mouth daily. (Patient not taking: Reported on 06/24/2023)     Multiple Vitamins-Minerals (ICAPS PO) Take 1 tablet by mouth daily.     Omega-3 Fatty Acids (FISH OIL OMEGA-3 PO) Take 2,400 mg by mouth daily.     PREVIDENT 5000 PLUS 1.1 % CREA dental cream See admin instructions.  3   Probiotic Product (PROBIOTIC PO) Take 1 capsule by mouth daily.     rosuvastatin  (CRESTOR ) 20 MG tablet Take 1 tablet (20 mg total) by mouth daily. 90 tablet 0   sodium chloride (OCEAN) 0.65 % SOLN nasal spray Place 1 spray into both nostrils daily at 6 (six) AM.     SYNTHROID  50 MCG tablet TAKE 1 tablet daily on Sun, Tues, Thurs, Sat, and 2 tabs (100mcg) on Mon/Wed/Fri 145 tablet 3   No current facility-administered medications on file prior to visit.    No Known Allergies  Past Medical History:  Diagnosis Date   Atrial fibrillation (HCC)    Diverticulosis    Dyspnea on exertion 02/18/2012   Eye exam abnormal 05/08/15   Dr.Groat-glaucoma suspect   Glaucoma  suspect    Hemorrhoids    internal and external   Hemorrhoids 09/25/10   internal and external   Hyperplastic colon polyp 09/25/10   Dr. Lavaughn [place]   Osteopenia    Palpitations 02/18/2012   Paroxysmal atrial fibrillation (HCC)    Pure hypercholesterolemia    Sleep apnea    Unspecified hypothyroidism    s/p ablation for hyperthyroidism   Urge urinary incontinence     Past Surgical History:  Procedure Laterality Date   ABDOMINAL HYSTERECTOMY  age 89   with BSO and bladder tack   COLONOSCOPY  11/06, 09/25/10, 12/2015   hyperplastic polyp 2012;    ESOPHAGOGASTRODUODENOSCOPY  09/25/10   tortuous esophagus, intact Nissen fundoplication, dilated the spasm at esophageal GE junction   EYE SURGERY     bilateral cataract surgery   LAPAROSCOPIC NISSEN FUNDOPLICATION  8/07   Dr. Gaylyn Keas   LARYNGOSCOPY   11/24/2017   dx.GERD,start omeprazole    Social History   Tobacco Use  Smoking Status Never  Smokeless Tobacco Never    Social History   Substance and Sexual Activity  Alcohol Use Yes   Alcohol/week: 0.0 standard drinks of alcohol   Comment: once a month or less    Family History  Problem Relation Age of Onset   Heart disease Mother    Heart disease Father    Diabetes Father    Marfan syndrome Sister    Heart disease Sister        valve replacement   Heart disease Sister        pacemaker   Aortic aneurysm Sister    Peripheral Artery Disease Sister        amputation of toes   Breast cancer Sister 88   Heart disease Sister        CABG at 43   Colon cancer Sister 59   Heart disease Sister        CABG   Heart disease Sister    Heart disease Sister    Hyperlipidemia Sister    Stroke Sister        in 1948, mild   Heart disease Brother    Diabetes Brother    Heart disease Brother    Diabetes Brother    Heart disease Brother    Heart disease Brother    Heart disease Brother        CABG   Heart disease Brother        CABG   Aortic aneurysm Brother 66       repaired (at level of kidney)   Heart disease Brother        stent    Review of Systems: The review of systems is per the HPI.  All other systems were reviewed and are negative.  Physical Exam: There were no vitals taken for this visit. GENERAL:  [name] in NAD HEENT:  PERRL, EOMI, sclera are clear. Oropharynx is clear. NECK:  No jugular venous distention, carotid upstroke brisk and symmetric, no bruits, no thyromegaly or adenopathy LUNGS:  Clear to auscultation bilaterally CHEST:  Unremarkable HEART:  RRR,  PMI not displaced or sustained,S1 and S2 within normal limits, no S3, no S4: no clicks, no rubs, no murmurs ABD:  Soft, nontender. BS +, no masses or bruits. No hepatomegaly, no splenomegaly EXT:  2 + pulses throughout, no edema, no cyanosis no clubbing SKIN:  Warm and dry.  No  rashes NEURO:  Alert and oriented x 3. Cranial nerves II through  XII intact. PSYCH:  Cognitively intact    LABORATORY DATA:        Lab Results  Component Value Date   WBC 5.0 06/24/2023   HGB 12.3 06/24/2023   HCT 36.6 06/24/2023   PLT 266 06/24/2023   GLUCOSE 99 06/24/2023   CHOL 157 04/15/2023   TRIG 147 04/15/2023   HDL 52 04/15/2023   LDLCALC 80 04/15/2023   ALT 27 06/24/2023   AST 26 06/24/2023   NA 128 (L) 06/24/2023   K 4.4 06/24/2023   CL 93 (L) 06/24/2023   CREATININE 0.69 06/24/2023   BUN 15 06/24/2023   CO2 19 (L) 06/24/2023   TSH 1.460 06/24/2023   HGBA1C 5.4 09/24/2015   Dated 08/13/22: creatinine 1.05, CMET otherwise normal.   Echo 09/23/17: Study Conclusions   - Left ventricle: The cavity size was normal. Wall thickness was   normal. Systolic function was normal. The estimated ejection   fraction was in the range of 60% to 65%. Doppler parameters are   consistent with abnormal left ventricular relaxation (grade 1   diastolic dysfunction). The Ee&' ratio is between 8-15, suggesting   indeterminate LV filling pressure. - Left atrium: The atrium was normal in size. - Tricuspid valve: There was mild regurgitation. - Pulmonary arteries: PA peak pressure: 27 mm Hg (S). - Inferior vena cava: The vessel was normal in size. The   respirophasic diameter changes were in the normal range (>= 50%),   consistent with normal central venous pressure.   Impressions:   - Compared to a prior study in 2013, the LVEF is unchanged. Left   and right atria measure normal in size.  Myoview  09/29/17: Study Highlights     Nuclear stress EF: 55%. No T wave inversion was noted during stress. There was no ST segment deviation noted during stress. The study is normal. This is a low risk study.   Normal perfusion. LVEF 55% with normal wall motion. This is a low risk study. No change compared to prior study.      Assessment / Plan:  1. Obstructive sleep apnea. On CPAP  therapy  2. PAF - treated with rate control with metoprolol  and Eliquis .  Now in NSR.  Continue current therapy. Labs ok. On lower Eliquis  dose due to age and weight.   3. Hypercholesterolemia. Last lipid panel showed excellent LDL of 60 on Crestor .  4. Hypothyroidism. TSH normal.   Follow up in 6 months.

## 2023-08-21 NOTE — Telephone Encounter (Signed)
 Provider portion uploaded to media

## 2023-08-24 ENCOUNTER — Encounter: Payer: Self-pay | Admitting: Cardiology

## 2023-08-24 ENCOUNTER — Ambulatory Visit: Attending: Cardiology | Admitting: Cardiology

## 2023-08-24 VITALS — BP 112/64 | HR 64 | Ht 59.0 in | Wt 117.4 lb

## 2023-08-24 DIAGNOSIS — E782 Mixed hyperlipidemia: Secondary | ICD-10-CM | POA: Insufficient documentation

## 2023-08-24 DIAGNOSIS — I480 Paroxysmal atrial fibrillation: Secondary | ICD-10-CM | POA: Insufficient documentation

## 2023-08-24 NOTE — Patient Instructions (Signed)
 Medication Instructions:  Continue same medications *If you need a refill on your cardiac medications before your next appointment, please call your pharmacy*  Lab Work:  None ordered  Testing/Procedures: None ordered  Follow-Up: At [facility], you and your health needs are our priority.  As part of our continuing mission to provide you with exceptional heart care, our providers are all part of one team.  This team includes your primary Cardiologist (physician) and Advanced Practice Providers or APPs (Physician Assistants and Nurse Practitioners) who all work together to provide you with the care you need, when you need it.  Your next appointment:  6 months   Call in August to schedule Dec appointment     Provider:  Dr.Jordan   We recommend signing up for the patient portal called MyChart.  Sign up information is provided on this After Visit Summary.  MyChart is used to connect with patients for Virtual Visits (Telemedicine).  Patients are able to view lab/test results, encounter notes, upcoming appointments, etc.  Non-urgent messages can be sent to your provider as well.   To learn more about what you can do with MyChart, go to [email or web address].

## 2023-08-25 ENCOUNTER — Telehealth: Payer: Self-pay

## 2023-08-25 NOTE — Telephone Encounter (Signed)
 Patient had appointment with Dr.Jordan 6/23.She decided she does not want to apply for Eliquis  patient assistance.She has medicaid now and Elquis is covered.

## 2023-08-26 NOTE — Telephone Encounter (Signed)
 Patient had appointment with Dr.Jordan 6/23.She decided she does not want to apply for Eliquis  patient assistance.She has medicaid now and Elquis is covered.

## 2023-09-10 DIAGNOSIS — R350 Frequency of micturition: Secondary | ICD-10-CM | POA: Diagnosis not present

## 2023-09-21 ENCOUNTER — Other Ambulatory Visit: Payer: Self-pay | Admitting: Family Medicine

## 2023-09-21 DIAGNOSIS — E782 Mixed hyperlipidemia: Secondary | ICD-10-CM

## 2023-10-08 DIAGNOSIS — R350 Frequency of micturition: Secondary | ICD-10-CM | POA: Diagnosis not present

## 2023-10-12 ENCOUNTER — Other Ambulatory Visit: Payer: Self-pay | Admitting: Cardiology

## 2023-10-13 ENCOUNTER — Telehealth: Payer: Self-pay | Admitting: Cardiology

## 2023-10-13 NOTE — Telephone Encounter (Signed)
*  STAT* If patient is at the pharmacy, call can be transferred to refill team.   1. Which medications need to be refilled? (please list name of each medication and dose if known)  metoprolol  tartrate (LOPRESSOR ) 25 MG tablet   2. Which pharmacy/location (including street and city if local pharmacy) is medication to be sent to? [facility] [address] [place], [place] - [address]   3. Do they need a 30 day or 90 day supply?  90 day supply

## 2023-10-14 MED ORDER — METOPROLOL TARTRATE 25 MG PO TABS: 25.0000 mg | ORAL_TABLET | Freq: Two times a day (BID) | ORAL | 3 refills | Status: AC

## 2023-10-14 NOTE — Telephone Encounter (Signed)
 Rx sent to pharmacy

## 2023-10-20 ENCOUNTER — Encounter: Payer: Self-pay | Admitting: *Deleted

## 2023-10-20 NOTE — Patient Instructions (Incomplete)
  HEALTH MAINTENANCE RECOMMENDATIONS:  It is recommended that you get at least 30 minutes of aerobic exercise at least 5 days/week (for weight loss, you may need as much as 60-90 minutes). This can be any activity that gets your heart rate up. This can be divided in 10-15 minute intervals if needed, but try and build up your endurance at least once a week.  Weight bearing exercise is also recommended twice weekly.  Eat a healthy diet with lots of vegetables, fruits and fiber.  Colorful foods have a lot of vitamins (ie green vegetables, tomatoes, red peppers, etc).  Limit sweet tea, regular sodas and alcoholic beverages, all of which has a lot of calories and sugar.  Up to 1 alcoholic drink daily may be beneficial for women (unless trying to lose weight, watch sugars).  Drink a lot of water.  Calcium  recommendations are 1200-1500 mg daily (1500 mg for postmenopausal women or women without ovaries), and vitamin D 1000 IU daily.  This should be obtained from diet and/or supplements (vitamins), and calcium  should not be taken all at once, but in divided doses.  Monthly self breast exams and yearly mammograms for women over the age of 65 is recommended.  Sunscreen of at least SPF 30 should be used on all sun-exposed parts of the skin when outside between the hours of 10 am and 4 pm (not just when at beach or pool, but even with exercise, golf, tennis, and yard work!)  Use a sunscreen that says broad spectrum so it covers both UVA and UVB rays, and make sure to reapply every 1-2 hours.  Remember to change the batteries in your smoke detectors when changing your clock times in the spring and fall. Carbon monoxide detectors are recommended for your home.  Use your seat belt every time you are in a car, and please drive safely and not be distracted with cell phones and texting while driving.   Abigail Day , Thank you for taking time to come for your Medicare Wellness Visit. I appreciate your ongoing  commitment to your health goals. Please review the following plan we discussed and let me know if I can assist you in the future.   This is a list of the screening recommended for you and due dates:  Health Maintenance  Topic Date Due   COVID-19 Vaccine (8 - 2024-25 season) 07/02/2023   Flu Shot  10/02/2023   Medicare Annual Wellness Visit  10/20/2024   DTaP/Tdap/Td vaccine (4 - Td or Tdap) 04/21/2032   Pneumococcal Vaccine for age over 81  Completed   DEXA scan (bone density measurement)  Completed   Zoster (Shingles) Vaccine  Completed   HPV Vaccine  Aged Out   Meningitis B Vaccine  Aged Out   Your hoarseness seems to not be as well controlled as the last time we talked about it. Be sure that you are drinking enough water, and doing the exercises form the speech therapist.  Please get your flu shot and updated COVID booster when it becomes available, this Fall. We gave you Prevnar-20 today.  You shouldn't need any further pneumonia shots.  Be sure to stay well hydrated, chew your food well, small bites If you have recurrent issues with swallowing, you should follow up with your gastroenterologist.

## 2023-10-20 NOTE — Progress Notes (Unsigned)
 No chief complaint on file.   Abigail Day is a 89 y.o. female who presents for Medicare annual wellness visit and follow-up on chronic medical conditions.   She has the following concerns:  She was last seen here in April, with diarrhea that had started at the time of her husband's funeral. Her TSH and CBC were stable. MCV elevated.  Lab Results  Component Value Date   WBC 5.0 06/24/2023   HGB 12.3 06/24/2023   HCT 36.6 06/24/2023   MCV 101 (H) 06/24/2023   PLT 266 06/24/2023   Some electrolyte abnormalities noted on chem panel--   Chemistry      Component Value Date/Time   NA 128 (L) 06/24/2023 1546   K 4.4 06/24/2023 1546   CL 93 (L) 06/24/2023 1546   CO2 19 (L) 06/24/2023 1546   BUN 15 06/24/2023 1546   CREATININE 0.69 06/24/2023 1546   CREATININE 0.83 04/21/2016 0852      Component Value Date/Time   CALCIUM  9.8 06/24/2023 1546   ALKPHOS 67 06/24/2023 1546   AST 26 06/24/2023 1546   ALT 27 06/24/2023 1546   BILITOT 0.3 06/24/2023 1546     ***UPDATE DIARRHEA?? ***   Hypothyroidism:  She takes her medication on an empty stomach, separate from food and other medications. She is compliant with taking Synthroid , and takes it properly.  Current dose is 100 mcg on Monday/Wednesday/Friday, and 50 mcg the other 4 days of the week.  TSH has remained WNL on this dose. No changes to hair/nail, energy, bowels. She lost some weight when she was at the hospital with her husband.  ***UPDATE WT AND BOWELS   Lab Results  Component Value Date   TSH 1.460 06/24/2023     Mixed hyperlipidemia:  She is compliant with taking Crestor , and denies side effects (previously took pravastatin  which wasn't getting her to goal for TG; previously didn't tolerate lipitor) She continues on fish oil (2/d),  and tries to follow lowfat, low cholesterol diet. She is also taking Coenzyme Q10 daily.   Lipids were at goal on this regimen on last check:  Lab Results  Component Value Date   CHOL 157  04/15/2023   HDL 52 04/15/2023   LDLCALC 80 04/15/2023   TRIG 147 04/15/2023   CHOLHDL 3.0 04/15/2023     Atrial fibrillation:  She remains on Eliquis  without any bleeding or complications, and is on metoprolol  for rate control.   She sees Dr. [name] every 6 months, last seen in June. No changes were made. She reports she can't always tell when she is in afib. She has some intermittent palpitations. No associated SOB or CP. Only has DOE with rushing, walking much faster, and with stairs. Denies any change/worsening in her shortness of breath.  Lab Results  Component Value Date   WBC 5.0 06/24/2023   HGB 12.3 06/24/2023   HCT 36.6 06/24/2023   MCV 101 (H) 06/24/2023   PLT 266 06/24/2023    HTN:  Compliant with medications.  Denies headaches, dizziness, chest pain, shortness of breath.   BP Readings from Last 3 Encounters:  08/24/23 112/64  06/24/23 118/68  04/20/23 120/72     OSA:  She is using CPAP, and doing well, under the care of Dr. Shellia. She feels more refreshed in the mornings.   Osteopenia--She took Boniva  through the end of June 2021 (had taken off/on, for total of about 5 years) Last DEXA was in 04/2023, with T-1.7 at L femoral neck.  She continues to take Calcium  and D (at lunch). She goes to the gym 2-3x/week, and uses weights. Does chair yoga 3x/week.   She has h/o back pain, previously saw Dr. Burnetta, Dr. Bonner. She had physical therapy, which helped.  She is doing home exercises, and chair yoga, both of which help. Also has h/o hip bursitis, periodically gets injections when flaring. She uses a topical salve to the lateral hip when it bothers her. Tylenol and rest also helps ease off her discomfort.  ***UPDATE   Urinary incontinence (felt to be mixed, primarily OAB): She is under the care of urology.  She was tried on Myrbetriq (too costly), Gemtesa. She has used oxybutynin and percutaneous tibial nerve stimulation. She finished a 12 week course of the PTNS,  which helped.  She went back for a second course, and is going monthly.  Her incontinence is significantly improved. She uses a pad at night, and finds some mornings that it is completely dry now. She denies dysuria or abdominal pain.    Hoarseness:  This is stable. She previously saw Dr. Karis, diagnosed with laryngopharyngeal reflux, and PPI was increased, but she didn't see any benefit to her symptoms. She has undergone rehab through [facility] lab.  She was told it was related to her thyroid  treatment (from voice specialist), and was advised to stop the omeprazole entirely.  She remains off PPI and denies any recurrent reflux symptoms.  If she drinks a lot of water, her voice improves. She continues to do home exercises (not as regularly as she should). Hoarseness is worse in the  mornings improves with drinking water, and resolves later in the day.    Immunization History  Administered Date(s) Administered   Fluad Quad(high Dose 65+) 12/29/2018, 11/24/2019, 12/18/2020, 11/13/2021, 02/13/2023   Influenza Split 01/08/2011, 01/08/2012   Influenza, High Dose Seasonal PF 12/08/2012, 12/19/2013, 12/21/2014, 12/13/2015, 10/23/2016, 12/14/2017   Moderna Covid-19 Vaccine Bivalent Booster 23yrs & up 12/18/2020   Moderna SARS-COV2 Booster Vaccination 07/16/2020   Moderna Sars-Covid-2 Vaccination 03/07/2019, 04/04/2019, 01/10/2020   Pfizer(Comirnaty)Fall Seasonal Vaccine 12 years and older 02/05/2022, 01/02/2023   Pneumococcal Conjugate-13 03/27/2014   Pneumococcal Polysaccharide-23 01/31/2002, 01/08/2012, 10/09/2021   RSV,unspecified 10/16/2022   Td 10/01/2004   Tdap 01/08/2012, 04/21/2022   Zoster Recombinant(Shingrix) 07/07/2017, 10/18/2017   Zoster, Live 06/01/2008   Last Pap smear: n/a   Last mammogram: 08/2023 Last colonoscopy: 12/2015, negative biopsy.  No f/u needed Last DEXA:  04/2023, with T-1.7 at L femoral neck. Ophtho: yearly Dentist: every 9 months   Exercise:   Chair yoga  3x/week, another exercise class (balance) once a week.  Goes to the gym 2x/week--cardio (elliptical x 30 minutes) and weights. Hasn't been walking as much due to the heat.    Patient Care Team: Randol Dawes, MD as PCP - General (Family Medicine) [name], Peter M, MD as PCP - Cardiology (Cardiology)  Dermatologist: Dr. Ivin (retired)--no longer goes to derm. Ophtho: Dr. Octavia  Dentist: Dr. Cosette Hai Pulmonary (for OSA): Dr. Shellia GI: Dr. Rosalie ENT: Dr. Karis Voice (speech pathologist) Lamarr Shan at [facility] Ortho: Dr. Burnetta Pain: Dr. Bonner Urologist: Dr. McDiarmid, Dr. Carolynn  Depression Screening: Flowsheet Row Office Visit from 10/15/2022 in [facility]  PHQ-2 Total Score 0     Falls screen:     04/13/2023    2:22 PM 10/15/2022    1:29 PM 04/16/2022   10:33 AM 10/09/2021    1:41 PM 03/27/2021    1:40 PM  Fall  Risk   Falls in the past year? 0 0 0 0 0  Number falls in past yr:  0 0 0 0  Injury with Fall?  0 0 0 0  Risk for fall due to :  No Fall Risks No Fall Risks No Fall Risks No Fall Risks  Follow up  Falls evaluation completed Falls evaluation completed Falls evaluation completed  Falls evaluation completed      Data saved with a previous flowsheet row definition     Functional Status Survey:        End of Life Discussion:  Patient has a living will and medical power of attorney, scanned   PMH, PSH, SH and FH reviewed and updated     ROS: The patient denies anorexia, fever, headaches, vision changes, decreased hearing, ear pain, sore throat, breast concerns, chest pain, syncope; denies cough, swelling, nausea, vomiting, constipation, abdominal pain, melena, hematochezia, indigestion/heartburn, dysphagia, vaginal bleeding, discharge, odor or itch, genital lesions, numbness, tingling, weakness, tremor, suspicious skin lesions, depression, anxiety, abnormal bleeding/bruising, or enlarged lymph nodes.   Some trouble remembering names, no changes. Hoarseness  per HPI, chronic/unchanged.  +Postnasal drainage Numbness in feet at night.  Using topical creams help. Denies any pain. Incontinence has improved, per HPI.  Denies dysuria, hematuria. Diarrhea ***  R lateral hip pain, and some mid back pain.  Stretches help. See HPI   PHYSICAL EXAM:  There were no vitals taken for this visit.  Wt Readings from Last 3 Encounters:  08/24/23 117 lb 6.4 oz (53.3 kg)  06/24/23 117 lb 3.2 oz (53.2 kg)  04/20/23 121 lb (54.9 kg)    General Appearance:    Alert, cooperative, no distress, appears stated age.  Voice is hoarse, unchanged from prior visits  Head:    Normocephalic, without obvious abnormality, atraumatic     Eyes:    PERRL, conjunctiva/corneas clear, EOM's intact, fundi benign     Ears:    Normal TM's and external ear canals     Nose:    Normal, no drainage or sinus tenderness  Throat:    Normal mucosa, no lesions. Upper dentures, partial lowers  Neck:    Supple, no lymphadenopathy; thyroid : no enlargement/ tenderness/nodules; no carotid bruit or JVD     Back:    Spine nontender, no CVA tenderness. +kyphosis, and some scoliosis, with right side/ribs being more prominent.   Lungs:    Clear to auscultation bilaterally without wheezes, rales or ronchi; respirations unlabored     Chest Wall:    No tenderness or deformity     Heart:    Regular rate and rhythm, S1 and S2 normal, no murmur, rub or gallop.   Breast Exam:    No tenderness, masses, or nipple discharge or inversion. No axillary lymphadenopathy     Abdomen:    Soft, nondistended, normoactive bowel sounds, no hepatosplenomegaly or masses. Very mild tenderness at L abdomen, no masses, rebound or guarding.   Genitalia:    Not performed today  Rectal:    Not performed today  Extremities:    No clubbing, cyanosis or edema. 2+ pulses. Nontender at R trochanteric bursa.  Area of pain is right lateral upper thigh; nontender along IT band.    Pulses:    2+ and symmetric all extremities      Skin:    Skin color, texture, turgor normal, no lesions or rash  Lymph nodes:    Cervical, supraclavicular, and axillary nodes normal     Neurologic:  CNII-XII intact, normal strength, sensation and gait; reflexes 2+ and symmetric throughout.  Alert and oriented.                Psych:    Normal mood, affect, hygiene and grooming  ***UPDATE if tender at L abdomen ***UPDATE if pelvic/rectal done ***UPDATE if tender R thigh, bursa  ASSESSMENT/PLAN:  Prevnar-20 if hasn't had elsewhere (I don't see it in vaccines)  Offer pelvic/rectal, can decline if not having issues, especially if urologist does pelvic exam  Cbc, c-met, B12 (macrocytosis)  Discussed monthly self breast exams and yearly mammograms; at least 30 minutes of aerobic activity at least 5 days/week, weight-bearing exercise at least 2x/week; proper sunscreen use reviewed; healthy diet, including goals of calcium  and vitamin D intake and alcohol recommendations (less than or equal to 1 drink/day) reviewed; regular seatbelt use; changing batteries in smoke detectors. Immunization recommendations discussed--continue yearly high dose flu shots. Updated bivalent COVID booster recommended when available in the Fall. Prevnar-20 *** Colonoscopy recommendations reviewed, UTD, no longer needed   MOST form reviewed and updated, full care.  F/u 6 months for med check.  Fasting labs prior CBC, TSH, c-met, lipids  ***ENTER FUTURE ORDERS   Medicare Attestation I have personally reviewed: The patient's medical and social history Their use of alcohol, tobacco or illicit drugs Their current medications and supplements The patient's functional ability including ADLs,fall risks, home safety risks, cognitive, and hearing and visual impairment Diet and physical activities Evidence for depression or mood disorders  The patient's weight, height, BMI have been recorded in the chart.  I have made referrals, counseling, and provided education to  the patient based on review of the above and I have provided the patient with a written personalized care plan for preventive services.

## 2023-10-21 ENCOUNTER — Ambulatory Visit (INDEPENDENT_AMBULATORY_CARE_PROVIDER_SITE_OTHER): Payer: Medicare Other | Admitting: Family Medicine

## 2023-10-21 ENCOUNTER — Encounter: Payer: Self-pay | Admitting: Family Medicine

## 2023-10-21 VITALS — BP 114/68 | HR 64 | Ht <= 58 in | Wt 117.0 lb

## 2023-10-21 DIAGNOSIS — G8929 Other chronic pain: Secondary | ICD-10-CM | POA: Diagnosis not present

## 2023-10-21 DIAGNOSIS — G4733 Obstructive sleep apnea (adult) (pediatric): Secondary | ICD-10-CM

## 2023-10-21 DIAGNOSIS — R29898 Other symptoms and signs involving the musculoskeletal system: Secondary | ICD-10-CM

## 2023-10-21 DIAGNOSIS — Z7901 Long term (current) use of anticoagulants: Secondary | ICD-10-CM | POA: Diagnosis not present

## 2023-10-21 DIAGNOSIS — D7589 Other specified diseases of blood and blood-forming organs: Secondary | ICD-10-CM | POA: Diagnosis not present

## 2023-10-21 DIAGNOSIS — M85852 Other specified disorders of bone density and structure, left thigh: Secondary | ICD-10-CM

## 2023-10-21 DIAGNOSIS — D6869 Other thrombophilia: Secondary | ICD-10-CM

## 2023-10-21 DIAGNOSIS — I480 Paroxysmal atrial fibrillation: Secondary | ICD-10-CM | POA: Diagnosis not present

## 2023-10-21 DIAGNOSIS — E782 Mixed hyperlipidemia: Secondary | ICD-10-CM

## 2023-10-21 DIAGNOSIS — E039 Hypothyroidism, unspecified: Secondary | ICD-10-CM

## 2023-10-21 DIAGNOSIS — E890 Postprocedural hypothyroidism: Secondary | ICD-10-CM

## 2023-10-21 DIAGNOSIS — R490 Dysphonia: Secondary | ICD-10-CM | POA: Diagnosis not present

## 2023-10-21 DIAGNOSIS — Z23 Encounter for immunization: Secondary | ICD-10-CM

## 2023-10-21 DIAGNOSIS — M545 Low back pain, unspecified: Secondary | ICD-10-CM | POA: Diagnosis not present

## 2023-10-21 DIAGNOSIS — Z0000 Encounter for general adult medical examination without abnormal findings: Secondary | ICD-10-CM | POA: Diagnosis not present

## 2023-10-21 DIAGNOSIS — Z5181 Encounter for therapeutic drug level monitoring: Secondary | ICD-10-CM

## 2023-10-22 ENCOUNTER — Ambulatory Visit: Payer: Self-pay | Admitting: Family Medicine

## 2023-10-22 LAB — COMPREHENSIVE METABOLIC PANEL WITH GFR
ALT: 19 IU/L (ref 0–32)
AST: 25 IU/L (ref 0–40)
Albumin: 4.7 g/dL (ref 3.7–4.7)
Alkaline Phosphatase: 72 IU/L (ref 44–121)
BUN/Creatinine Ratio: 23 (ref 12–28)
BUN: 18 mg/dL (ref 8–27)
Bilirubin Total: 0.3 mg/dL (ref 0.0–1.2)
CO2: 20 mmol/L (ref 20–29)
Calcium: 10 mg/dL (ref 8.7–10.3)
Chloride: 99 mmol/L (ref 96–106)
Creatinine, Ser: 0.78 mg/dL (ref 0.57–1.00)
Globulin, Total: 2 g/dL (ref 1.5–4.5)
Glucose: 102 mg/dL — AB (ref 70–99)
Potassium: 4.2 mmol/L (ref 3.5–5.2)
Sodium: 135 mmol/L (ref 134–144)
Total Protein: 6.7 g/dL (ref 6.0–8.5)
eGFR: 73 mL/min/1.73 (ref >59)

## 2023-10-22 LAB — CBC WITH DIFFERENTIAL/PLATELET
Basophils Absolute: 0 x10E3/uL (ref 0.0–0.2)
Basos: 1 %
EOS (ABSOLUTE): 0.1 x10E3/uL (ref 0.0–0.4)
Eos: 3 %
Hematocrit: 37.5 % (ref 34.0–46.6)
Hemoglobin: 12.6 g/dL (ref 11.1–15.9)
Immature Grans (Abs): 0 x10E3/uL (ref 0.0–0.1)
Immature Granulocytes: 0 %
Lymphocytes Absolute: 1.1 x10E3/uL (ref 0.7–3.1)
Lymphs: 29 %
MCH: 34.1 pg — ABNORMAL HIGH (ref 26.6–33.0)
MCHC: 33.6 g/dL (ref 31.5–35.7)
MCV: 101 fL — ABNORMAL HIGH (ref 79–97)
Monocytes Absolute: 0.5 x10E3/uL (ref 0.1–0.9)
Monocytes: 13 %
Neutrophils Absolute: 2.1 x10E3/uL (ref 1.4–7.0)
Neutrophils: 54 %
Platelets: 239 x10E3/uL (ref 150–450)
RBC: 3.7 x10E6/uL — ABNORMAL LOW (ref 3.77–5.28)
RDW: 11.9 % (ref 11.7–15.4)
WBC: 3.9 x10E3/uL (ref 3.4–10.8)

## 2023-10-22 LAB — VITAMIN B12: Vitamin B-12: 564 pg/mL (ref 232–1245)

## 2023-10-26 DIAGNOSIS — M6281 Muscle weakness (generalized): Secondary | ICD-10-CM | POA: Diagnosis not present

## 2023-10-26 DIAGNOSIS — M5459 Other low back pain: Secondary | ICD-10-CM | POA: Diagnosis not present

## 2023-10-26 DIAGNOSIS — R262 Difficulty in walking, not elsewhere classified: Secondary | ICD-10-CM | POA: Diagnosis not present

## 2023-10-29 DIAGNOSIS — R262 Difficulty in walking, not elsewhere classified: Secondary | ICD-10-CM | POA: Diagnosis not present

## 2023-10-29 DIAGNOSIS — M6281 Muscle weakness (generalized): Secondary | ICD-10-CM | POA: Diagnosis not present

## 2023-10-29 DIAGNOSIS — M5459 Other low back pain: Secondary | ICD-10-CM | POA: Diagnosis not present

## 2023-11-03 DIAGNOSIS — M6281 Muscle weakness (generalized): Secondary | ICD-10-CM | POA: Diagnosis not present

## 2023-11-03 DIAGNOSIS — R262 Difficulty in walking, not elsewhere classified: Secondary | ICD-10-CM | POA: Diagnosis not present

## 2023-11-03 DIAGNOSIS — M5459 Other low back pain: Secondary | ICD-10-CM | POA: Diagnosis not present

## 2023-11-05 ENCOUNTER — Other Ambulatory Visit (HOSPITAL_COMMUNITY): Payer: Self-pay

## 2023-11-05 DIAGNOSIS — M5459 Other low back pain: Secondary | ICD-10-CM | POA: Diagnosis not present

## 2023-11-05 DIAGNOSIS — M6281 Muscle weakness (generalized): Secondary | ICD-10-CM | POA: Diagnosis not present

## 2023-11-05 DIAGNOSIS — R262 Difficulty in walking, not elsewhere classified: Secondary | ICD-10-CM | POA: Diagnosis not present

## 2023-11-05 DIAGNOSIS — R350 Frequency of micturition: Secondary | ICD-10-CM | POA: Diagnosis not present

## 2023-11-10 DIAGNOSIS — M5459 Other low back pain: Secondary | ICD-10-CM | POA: Diagnosis not present

## 2023-11-10 DIAGNOSIS — M6281 Muscle weakness (generalized): Secondary | ICD-10-CM | POA: Diagnosis not present

## 2023-11-10 DIAGNOSIS — R262 Difficulty in walking, not elsewhere classified: Secondary | ICD-10-CM | POA: Diagnosis not present

## 2023-11-12 DIAGNOSIS — R262 Difficulty in walking, not elsewhere classified: Secondary | ICD-10-CM | POA: Diagnosis not present

## 2023-11-12 DIAGNOSIS — M6281 Muscle weakness (generalized): Secondary | ICD-10-CM | POA: Diagnosis not present

## 2023-11-12 DIAGNOSIS — M5459 Other low back pain: Secondary | ICD-10-CM | POA: Diagnosis not present

## 2023-11-17 DIAGNOSIS — R262 Difficulty in walking, not elsewhere classified: Secondary | ICD-10-CM | POA: Diagnosis not present

## 2023-11-17 DIAGNOSIS — M6281 Muscle weakness (generalized): Secondary | ICD-10-CM | POA: Diagnosis not present

## 2023-11-17 DIAGNOSIS — M5459 Other low back pain: Secondary | ICD-10-CM | POA: Diagnosis not present

## 2023-11-19 DIAGNOSIS — M5459 Other low back pain: Secondary | ICD-10-CM | POA: Diagnosis not present

## 2023-11-19 DIAGNOSIS — R262 Difficulty in walking, not elsewhere classified: Secondary | ICD-10-CM | POA: Diagnosis not present

## 2023-11-19 DIAGNOSIS — M6281 Muscle weakness (generalized): Secondary | ICD-10-CM | POA: Diagnosis not present

## 2023-11-24 DIAGNOSIS — M6281 Muscle weakness (generalized): Secondary | ICD-10-CM | POA: Diagnosis not present

## 2023-11-24 DIAGNOSIS — M5459 Other low back pain: Secondary | ICD-10-CM | POA: Diagnosis not present

## 2023-11-24 DIAGNOSIS — R262 Difficulty in walking, not elsewhere classified: Secondary | ICD-10-CM | POA: Diagnosis not present

## 2023-11-25 ENCOUNTER — Telehealth: Payer: Self-pay | Admitting: *Deleted

## 2023-11-25 NOTE — Telephone Encounter (Signed)
 Copied from CRM #[id number]. Topic: General - Other >> Nov 25, 2023  3:35 PM Charlet HERO wrote: Reason for CRM: [phone] fax [phone] Ms. sheela, is calling to get speech therapy orders Lucienne called back to give verbal order but will need to signed and faxed.   Faxed.

## 2023-11-25 NOTE — Telephone Encounter (Signed)
 Verbal orders given

## 2023-11-25 NOTE — Telephone Encounter (Signed)
 Copied from CRM #[id number]. Topic: Clinical - Home Health Verbal Orders >> Nov 25, 2023 11:16 AM Willma SAUNDERS wrote: Caller/Agency: [facility], [facility]  Callback Number: [phone] Fax:[phone] Service Requested: Speech Therapy Frequency: open to dr  Any new concerns about the patient? No   Is this okay?

## 2023-11-26 DIAGNOSIS — R262 Difficulty in walking, not elsewhere classified: Secondary | ICD-10-CM | POA: Diagnosis not present

## 2023-11-26 DIAGNOSIS — M5459 Other low back pain: Secondary | ICD-10-CM | POA: Diagnosis not present

## 2023-11-26 DIAGNOSIS — M6281 Muscle weakness (generalized): Secondary | ICD-10-CM | POA: Diagnosis not present

## 2023-12-01 DIAGNOSIS — R262 Difficulty in walking, not elsewhere classified: Secondary | ICD-10-CM | POA: Diagnosis not present

## 2023-12-01 DIAGNOSIS — M5459 Other low back pain: Secondary | ICD-10-CM | POA: Diagnosis not present

## 2023-12-01 DIAGNOSIS — M6281 Muscle weakness (generalized): Secondary | ICD-10-CM | POA: Diagnosis not present

## 2023-12-02 ENCOUNTER — Telehealth: Payer: Self-pay | Admitting: *Deleted

## 2023-12-02 NOTE — Telephone Encounter (Signed)
 Copied from CRM [id number]. Topic: General - Other >> Dec 02, 2023  8:48 AM Treva T wrote: Reason for CRM: Received call from Verneita, with [facility], [phone], calling on behalf of patient.  Caller states she is checking status of a speech therapy order that provider requested for patient. Reports no order has been received from provider. States order/form needs to be signed and faxed.   Called and spoke to office, requested form for speech therapy be faxed to office again for completion.  Myles Verneita of above, verbalized understanding, states will fax form back to office for completion.   Ph. [phone]  Fax [phone]  Forwarding to Melissa.

## 2023-12-02 NOTE — Telephone Encounter (Signed)
 Left message for Verneita at [facility] that we have not received fax from them that they were going to re fax. Advised Veronica fax orders/script over on 11/25/23  Still no email from [facility], re faxing previously faxed rx/order

## 2023-12-03 DIAGNOSIS — R262 Difficulty in walking, not elsewhere classified: Secondary | ICD-10-CM | POA: Diagnosis not present

## 2023-12-03 DIAGNOSIS — M6281 Muscle weakness (generalized): Secondary | ICD-10-CM | POA: Diagnosis not present

## 2023-12-03 DIAGNOSIS — M5459 Other low back pain: Secondary | ICD-10-CM | POA: Diagnosis not present

## 2023-12-03 DIAGNOSIS — R490 Dysphonia: Secondary | ICD-10-CM | POA: Diagnosis not present

## 2023-12-07 ENCOUNTER — Telehealth: Payer: Self-pay | Admitting: *Deleted

## 2023-12-07 NOTE — Telephone Encounter (Signed)
 Copied from CRM [id number]. Topic: General - Other >> Dec 07, 2023  3:43 PM Everette C wrote: Reason for CRM: Verneita with [facility] has called to confirm receipt of paperwork submitted to the practice via fax for the patient's orders for speech therapy request. Please contact further when possible at [phone]  Faxed. And left message on vm to let Verneita Breed know.

## 2023-12-08 DIAGNOSIS — M6281 Muscle weakness (generalized): Secondary | ICD-10-CM | POA: Diagnosis not present

## 2023-12-08 DIAGNOSIS — R490 Dysphonia: Secondary | ICD-10-CM | POA: Diagnosis not present

## 2023-12-08 DIAGNOSIS — M5459 Other low back pain: Secondary | ICD-10-CM | POA: Diagnosis not present

## 2023-12-08 DIAGNOSIS — R262 Difficulty in walking, not elsewhere classified: Secondary | ICD-10-CM | POA: Diagnosis not present

## 2023-12-10 DIAGNOSIS — M6281 Muscle weakness (generalized): Secondary | ICD-10-CM | POA: Diagnosis not present

## 2023-12-10 DIAGNOSIS — M5459 Other low back pain: Secondary | ICD-10-CM | POA: Diagnosis not present

## 2023-12-10 DIAGNOSIS — R490 Dysphonia: Secondary | ICD-10-CM | POA: Diagnosis not present

## 2023-12-10 DIAGNOSIS — R262 Difficulty in walking, not elsewhere classified: Secondary | ICD-10-CM | POA: Diagnosis not present

## 2023-12-14 ENCOUNTER — Other Ambulatory Visit: Payer: Self-pay | Admitting: Family Medicine

## 2023-12-14 DIAGNOSIS — E039 Hypothyroidism, unspecified: Secondary | ICD-10-CM

## 2023-12-15 DIAGNOSIS — M6281 Muscle weakness (generalized): Secondary | ICD-10-CM | POA: Diagnosis not present

## 2023-12-15 DIAGNOSIS — M5459 Other low back pain: Secondary | ICD-10-CM | POA: Diagnosis not present

## 2023-12-15 DIAGNOSIS — R262 Difficulty in walking, not elsewhere classified: Secondary | ICD-10-CM | POA: Diagnosis not present

## 2023-12-15 DIAGNOSIS — R490 Dysphonia: Secondary | ICD-10-CM | POA: Diagnosis not present

## 2023-12-17 DIAGNOSIS — R262 Difficulty in walking, not elsewhere classified: Secondary | ICD-10-CM | POA: Diagnosis not present

## 2023-12-17 DIAGNOSIS — M5459 Other low back pain: Secondary | ICD-10-CM | POA: Diagnosis not present

## 2023-12-17 DIAGNOSIS — M6281 Muscle weakness (generalized): Secondary | ICD-10-CM | POA: Diagnosis not present

## 2023-12-17 DIAGNOSIS — R490 Dysphonia: Secondary | ICD-10-CM | POA: Diagnosis not present

## 2023-12-18 ENCOUNTER — Other Ambulatory Visit: Payer: Self-pay | Admitting: Family Medicine

## 2023-12-18 DIAGNOSIS — E782 Mixed hyperlipidemia: Secondary | ICD-10-CM

## 2023-12-22 DIAGNOSIS — R490 Dysphonia: Secondary | ICD-10-CM | POA: Diagnosis not present

## 2023-12-22 DIAGNOSIS — M5459 Other low back pain: Secondary | ICD-10-CM | POA: Diagnosis not present

## 2023-12-22 DIAGNOSIS — M6281 Muscle weakness (generalized): Secondary | ICD-10-CM | POA: Diagnosis not present

## 2023-12-22 DIAGNOSIS — R262 Difficulty in walking, not elsewhere classified: Secondary | ICD-10-CM | POA: Diagnosis not present

## 2023-12-24 DIAGNOSIS — M6281 Muscle weakness (generalized): Secondary | ICD-10-CM | POA: Diagnosis not present

## 2023-12-24 DIAGNOSIS — R262 Difficulty in walking, not elsewhere classified: Secondary | ICD-10-CM | POA: Diagnosis not present

## 2023-12-24 DIAGNOSIS — M5459 Other low back pain: Secondary | ICD-10-CM | POA: Diagnosis not present

## 2023-12-24 DIAGNOSIS — R490 Dysphonia: Secondary | ICD-10-CM | POA: Diagnosis not present

## 2023-12-29 DIAGNOSIS — M6281 Muscle weakness (generalized): Secondary | ICD-10-CM | POA: Diagnosis not present

## 2023-12-29 DIAGNOSIS — M5459 Other low back pain: Secondary | ICD-10-CM | POA: Diagnosis not present

## 2023-12-29 DIAGNOSIS — R262 Difficulty in walking, not elsewhere classified: Secondary | ICD-10-CM | POA: Diagnosis not present

## 2023-12-29 DIAGNOSIS — R490 Dysphonia: Secondary | ICD-10-CM | POA: Diagnosis not present

## 2023-12-31 DIAGNOSIS — R350 Frequency of micturition: Secondary | ICD-10-CM | POA: Diagnosis not present

## 2023-12-31 DIAGNOSIS — R490 Dysphonia: Secondary | ICD-10-CM | POA: Diagnosis not present

## 2023-12-31 DIAGNOSIS — M5459 Other low back pain: Secondary | ICD-10-CM | POA: Diagnosis not present

## 2023-12-31 DIAGNOSIS — R262 Difficulty in walking, not elsewhere classified: Secondary | ICD-10-CM | POA: Diagnosis not present

## 2023-12-31 DIAGNOSIS — M6281 Muscle weakness (generalized): Secondary | ICD-10-CM | POA: Diagnosis not present

## 2024-01-12 DIAGNOSIS — M6281 Muscle weakness (generalized): Secondary | ICD-10-CM | POA: Diagnosis not present

## 2024-01-12 DIAGNOSIS — R490 Dysphonia: Secondary | ICD-10-CM | POA: Diagnosis not present

## 2024-01-12 DIAGNOSIS — R262 Difficulty in walking, not elsewhere classified: Secondary | ICD-10-CM | POA: Diagnosis not present

## 2024-01-12 DIAGNOSIS — M5459 Other low back pain: Secondary | ICD-10-CM | POA: Diagnosis not present

## 2024-01-14 DIAGNOSIS — R490 Dysphonia: Secondary | ICD-10-CM | POA: Diagnosis not present

## 2024-01-14 DIAGNOSIS — M5459 Other low back pain: Secondary | ICD-10-CM | POA: Diagnosis not present

## 2024-01-14 DIAGNOSIS — R262 Difficulty in walking, not elsewhere classified: Secondary | ICD-10-CM | POA: Diagnosis not present

## 2024-01-14 DIAGNOSIS — M6281 Muscle weakness (generalized): Secondary | ICD-10-CM | POA: Diagnosis not present

## 2024-01-19 ENCOUNTER — Other Ambulatory Visit: Payer: Self-pay | Admitting: Cardiology

## 2024-01-19 DIAGNOSIS — M6281 Muscle weakness (generalized): Secondary | ICD-10-CM | POA: Diagnosis not present

## 2024-01-19 DIAGNOSIS — R490 Dysphonia: Secondary | ICD-10-CM | POA: Diagnosis not present

## 2024-01-19 DIAGNOSIS — M5459 Other low back pain: Secondary | ICD-10-CM | POA: Diagnosis not present

## 2024-01-19 DIAGNOSIS — R262 Difficulty in walking, not elsewhere classified: Secondary | ICD-10-CM | POA: Diagnosis not present

## 2024-01-19 NOTE — Telephone Encounter (Signed)
 Prescription refill request for Eliquis  received. Indication:afib Last office visit:6/25 Scr:0.78  8/25 Age: 89 Weight:53.1  kg  Prescription refilled

## 2024-01-21 DIAGNOSIS — R262 Difficulty in walking, not elsewhere classified: Secondary | ICD-10-CM | POA: Diagnosis not present

## 2024-01-21 DIAGNOSIS — M5459 Other low back pain: Secondary | ICD-10-CM | POA: Diagnosis not present

## 2024-01-21 DIAGNOSIS — R490 Dysphonia: Secondary | ICD-10-CM | POA: Diagnosis not present

## 2024-01-21 DIAGNOSIS — M6281 Muscle weakness (generalized): Secondary | ICD-10-CM | POA: Diagnosis not present

## 2024-02-04 DIAGNOSIS — R350 Frequency of micturition: Secondary | ICD-10-CM | POA: Diagnosis not present

## 2024-03-13 ENCOUNTER — Other Ambulatory Visit: Payer: Self-pay | Admitting: Family Medicine

## 2024-03-13 DIAGNOSIS — E782 Mixed hyperlipidemia: Secondary | ICD-10-CM

## 2024-03-16 ENCOUNTER — Other Ambulatory Visit: Payer: Self-pay | Admitting: Family Medicine

## 2024-03-16 DIAGNOSIS — E039 Hypothyroidism, unspecified: Secondary | ICD-10-CM

## 2024-03-17 ENCOUNTER — Encounter: Payer: Self-pay | Admitting: Family Medicine

## 2024-03-21 ENCOUNTER — Ambulatory Visit: Admitting: Adult Health

## 2024-03-21 ENCOUNTER — Telehealth: Payer: Self-pay

## 2024-03-21 ENCOUNTER — Encounter: Payer: Self-pay | Admitting: Adult Health

## 2024-03-21 VITALS — BP 140/69 | HR 60 | Temp 97.9°F | Ht 59.0 in | Wt 121.2 lb

## 2024-03-21 DIAGNOSIS — G4733 Obstructive sleep apnea (adult) (pediatric): Secondary | ICD-10-CM

## 2024-03-21 NOTE — Telephone Encounter (Signed)
 Called and spoke with patient,patient will bring in machine to appointment today.

## 2024-03-21 NOTE — Progress Notes (Signed)
 I need that little mental breaks through doughnuts help  @Patient  ID: Abigail Day, female    DOB: [birth year], 89 y.o.   MRN: [id number]  Chief Complaint  Patient presents with   Obstructive Sleep Apnea    Follow up cpap    Referring provider: Randol Dawes, MD  HPI: 89 year old female followed for obstructive sleep apnea History significant for A-fib   TEST/EVENTS : Reviewed 03/21/2024  PSG 08/18/12 >> AHI 23, SpO2 low 81%    Echo 09/23/17 >> EF 60 to 65%, grade 1 DD, mild TR   03/21/2024 Follow up : OSA  Patient presents for 1 year follow-up.  Patient has moderate obstructive sleep apnea on nocturnal CPAP.  Patient says she is doing well on CPAP.  Wears her CPAP every single night for at least 7 hr. .  Feels that she benefits from CPAP. CPAP download was requested.  Husband passed away last year. Local family helps her.  Living at [facility].  Has Phillips CPAP -replaced machine from recall. Using nasal mask.  Requesting a new CPAP machine as hers is getting old.    Allergies[1]  Immunization History  Administered Date(s) Administered   Fluad Quad(high Dose 65+) 12/29/2018, 11/24/2019, 12/18/2020, 11/13/2021, 02/13/2023   INFLUENZA, HIGH DOSE SEASONAL PF 12/08/2012, 12/19/2013, 12/21/2014, 12/13/2015, 10/23/2016, 12/14/2017   Influenza Split 01/08/2011, 01/08/2012   Moderna Covid-19 Vaccine Bivalent Booster 68yrs & up 12/18/2020   Moderna SARS-COV2 Booster Vaccination 07/16/2020   Moderna Sars-Covid-2 Vaccination 03/07/2019, 04/04/2019, 01/10/2020   PNEUMOCOCCAL CONJUGATE-20 10/21/2023   Pfizer(Comirnaty)Fall Seasonal Vaccine 12 years and older 02/05/2022, 01/02/2023   Pneumococcal Conjugate-13 03/27/2014   Pneumococcal Polysaccharide-23 01/31/2002, 01/08/2012, 10/09/2021   RSV,unspecified 10/16/2022   Td 10/01/2004   Tdap 01/08/2012, 04/21/2022   Zoster Recombinant(Shingrix) 07/07/2017, 10/18/2017   Zoster, Live 06/01/2008    Past Medical History:   Diagnosis Date   Atrial fibrillation (HCC)    Diverticulosis    Dyspnea on exertion 02/18/2012   Eye exam abnormal 05/08/15   Dr.Groat-glaucoma suspect   Glaucoma suspect    Hemorrhoids    internal and external   Hemorrhoids 09/25/10   internal and external   Hyperplastic colon polyp 09/25/10   Dr. Rosalie   Osteopenia    Palpitations 02/18/2012   Paroxysmal atrial fibrillation (HCC)    Pure hypercholesterolemia    Sleep apnea    Unspecified hypothyroidism    s/p ablation for hyperthyroidism   Urge urinary incontinence     Tobacco History: Tobacco Use History[2] Counseling given: Not Answered   Outpatient Medications Prior to Visit  Medication Sig Dispense Refill   acetaminophen (TYLENOL) 650 MG CR tablet Take 650 mg by mouth every 8 (eight) hours as needed for pain.     apixaban  (ELIQUIS ) 2.5 MG TABS tablet Take 1 tablet by mouth twice daily 180 tablet 1   Calcium  Carbonate-Vitamin D (CALCIUM  600 + D PO) Take 2 tablets by mouth daily.     co-enzyme Q-10 30 MG capsule Take 30 mg by mouth daily.     Docusate Calcium  (STOOL SOFTENER PO) Take 1 capsule by mouth daily.     loratadine (CLARITIN) 10 MG tablet Take 10 mg by mouth daily.     metoprolol  tartrate (LOPRESSOR ) 25 MG tablet Take 1 tablet (25 mg total) by mouth 2 (two) times daily. 180 tablet 3   mirabegron ER (MYRBETRIQ) 50 MG TB24 tablet Take 50 mg by mouth daily.     Misc Natural Products (OSTEO BI-FLEX JOINT SHIELD PO)  Take 2 tablets by mouth daily.     Multiple Vitamin (MULTI-VITAMIN) tablet Take 1 tablet by mouth daily.     Multiple Vitamins-Minerals (ICAPS PO) Take 2 tablets by mouth daily.     Omega-3 Fatty Acids (FISH OIL OMEGA-3 PO) Take 2,400 mg by mouth daily.     Probiotic Product (PROBIOTIC PO) Take 1 capsule by mouth daily.     rosuvastatin  (CRESTOR ) 20 MG tablet Take 1 tablet by mouth once daily 90 tablet 0   SYNTHROID  50 MCG tablet TAKE 1 TABLET BY MOUTH ONCE DAILY ON "SUNDAY, TUESDAY, THURSDAY, SATURDAY AND  TAKE 2 TABLET ON MONDAY, WEDNESDAY AND FRIDAY. 145 tablet 0   vitamin C (ASCORBIC ACID) 250 MG tablet Take 250 mg by mouth daily.     No facility-administered medications prior to visit.     Review of Systems:   Constitutional:   No  weight loss, night sweats,  Fevers, chills, fatigue, or  lassitude.  HEENT:   No headaches,  Difficulty swallowing,  Tooth/dental problems, or  Sore throat,                No sneezing, itching, ear ache, nasal congestion, post nasal drip,  Chronic hoarseness   CV:  No chest pain,  Orthopnea, PND, swelling in lower extremities, anasarca, dizziness, palpitations, syncope.   GI  No heartburn, indigestion, abdominal pain, nausea, vomiting, diarrhea, change in bowel habits, loss of appetite, bloody stools.   Resp: No shortness of breath with exertion or at rest.  No excess mucus, no productive cough,  No non-productive cough,  No coughing up of blood.  No change in color of mucus.  No wheezing.  No chest wall deformity  Skin: no rash or lesions.  GU: no dysuria, change in color of urine, no urgency or frequency.  No flank pain, no hematuria   MS:  No joint pain or swelling.  No decreased range of motion.  No back pain.    Physical Exam  BP (!) 140/69   Pulse 60   Temp 97.9 F (36.6 C) (Oral)   Ht 4' 11 (1.499 m)   Wt 121 lb 3.2 oz (55 kg)   SpO2 98%   BMI 24.48 kg/m   GEN: A/Ox3; pleasant , NAD, well nourished , cane    HEENT:  [place]/AT,   no lesions, no postnasal drip or exudate noted.   NECK:  Supple w/ fair ROM; no JVD; normal carotid impulses w/o bruits; no thyromegaly or nodules palpated; no lymphadenopathy.    RESP  Clear  P & A; w/o, wheezes/ rales/ or rhonchi. no accessory muscle use, no dullness to percussion  CARD:  RRR, no m/r/g, no peripheral edema, pulses intact, no cyanosis or clubbing.  GI:   Soft & nt; nml bowel sounds; no organomegaly or masses detected.   Musco: Warm bil, no deformities or joint swelling noted.   Neuro:  alert, no focal deficits noted.    Skin: Warm, no lesions or rashes    Lab Results:Reviewed 03/21/2024   CBC     BNP No results found for: BNP  ProBNP No results found for: PROBNP  Imaging: No results found.  Administration History     None           No data to display          No results found for: NITRICOXIDE     12" /16/2020    9:55 AM  Results of the [place] flowsheet  Sitting and reading  1  Watching TV 2  Sitting, inactive in a public place (e.g. a theatre or a meeting) 0  As a passenger in a car for an hour without a break 1  Lying down to rest in the afternoon when circumstances permit 1  Sitting and talking to someone 0  Sitting quietly after a lunch without alcohol 0  In a car, while stopped for a few minutes in traffic 0  Total score 5        Assessment & Plan:   Assessment and Plan Moderate obstructive sleep apnea with excellent compliance on CPAP. Perceived benefit  Download requested and shows excellent compliance with daily average usage at 6 hours, AHI 5.9/hour. Patient is doing well on CPAP therapy.  CPAP machine is getting old.  Will need order for new machine.     Plan  Patient Instructions  Continue on CPAP At bedtime, wear all night long  CPAP download  Order for new machine.  Saline nasal gel At bedtime  (AYR) Activity as tolerated.  Do not drive if sleepy  Follow up in 1 year with Dr. Neda or Ryshawn Sanzone NP            Madelin Stank, NP 03/21/2024       [1] No Known Allergies [2]  Social History Tobacco Use  Smoking Status Never   Passive exposure: Never  Smokeless Tobacco Never

## 2024-03-21 NOTE — Patient Instructions (Addendum)
 Continue on CPAP At bedtime, wear all night long  CPAP download  Order for new machine.  Saline nasal gel At bedtime  (AYR) Activity as tolerated.  Do not drive if sleepy  Follow up in 1 year with Dr. Neda or Larina Lieurance NP

## 2024-03-21 NOTE — Progress Notes (Unsigned)
 "  Abigail Day Date of Birth: 25-Jul-1933 Medical Record #[id number]  History of Present Illness: Abigail Day is seen for follow up of Afib. She has a history of paroxysmal atrial fib - on Eliquis . EF is normal per echo back in December of 2013. Normal Myoview  in June 2015. Other issues include HLD, hypothyroidism with prior ablation for hyperthyroidism, and diverticulosis. She is  on CPAP therapy for sleep apnea-followed by pulmonary. In 2019 she had some scapular pain and dyspnea. We repeated an Echo and Myoview  study which were unremarkable.  On follow up today she notes her husband recently passed. She hasn't really been aware of Afib.  Has bad back pain due to arthritis. This is the only time she gets SOB.  No chest pain. No edema.  She states her nerves are still rattled.    Current Outpatient Medications on File Prior to Visit  Medication Sig Dispense Refill   acetaminophen (TYLENOL) 650 MG CR tablet Take 650 mg by mouth every 8 (eight) hours as needed for pain.     apixaban  (ELIQUIS ) 2.5 MG TABS tablet Take 1 tablet by mouth twice daily 180 tablet 1   Calcium  Carbonate-Vitamin D (CALCIUM  600 + D PO) Take 2 tablets by mouth daily.     co-enzyme Q-10 30 MG capsule Take 30 mg by mouth daily.     Docusate Calcium  (STOOL SOFTENER PO) Take 1 capsule by mouth daily.     loratadine (CLARITIN) 10 MG tablet Take 10 mg by mouth daily.     metoprolol  tartrate (LOPRESSOR ) 25 MG tablet Take 1 tablet (25 mg total) by mouth 2 (two) times daily. 180 tablet 3   mirabegron ER (MYRBETRIQ) 50 MG TB24 tablet Take 50 mg by mouth daily.     Misc Natural Products (OSTEO BI-FLEX JOINT SHIELD PO) Take 2 tablets by mouth daily.     Multiple Vitamin (MULTI-VITAMIN) tablet Take 1 tablet by mouth daily.     Multiple Vitamins-Minerals (ICAPS PO) Take 2 tablets by mouth daily.     Omega-3 Fatty Acids (FISH OIL OMEGA-3 PO) Take 2,400 mg by mouth daily.     Probiotic Product (PROBIOTIC PO) Take 1 capsule by mouth daily.      rosuvastatin  (CRESTOR ) 20 MG tablet Take 1 tablet by mouth once daily 90 tablet 0   SYNTHROID  50 MCG tablet TAKE 1 TABLET BY MOUTH ONCE DAILY ON SUNDAY, TUESDAY, THURSDAY, SATURDAY AND TAKE 2 TABLET ON MONDAY, WEDNESDAY AND FRIDAY. 145 tablet 0   vitamin C (ASCORBIC ACID) 250 MG tablet Take 250 mg by mouth daily.     No current facility-administered medications on file prior to visit.    No Known Allergies  Past Medical History:  Diagnosis Date   Atrial fibrillation (HCC)    Diverticulosis    Dyspnea on exertion 02/18/2012   Eye exam abnormal 05/08/15   Dr.Groat-glaucoma suspect   Glaucoma suspect    Hemorrhoids    internal and external   Hemorrhoids 09/25/10   internal and external   Hyperplastic colon polyp 09/25/10   Dr. Rosalie   Osteopenia    Palpitations 02/18/2012   Paroxysmal atrial fibrillation (HCC)    Pure hypercholesterolemia    Sleep apnea    Unspecified hypothyroidism    s/p ablation for hyperthyroidism   Urge urinary incontinence     Past Surgical History:  Procedure Laterality Date   ABDOMINAL HYSTERECTOMY  age 60   with BSO and bladder tack   COLONOSCOPY  11/06, 09/25/10, 12/2015  hyperplastic polyp 2012;    ESOPHAGOGASTRODUODENOSCOPY  09/25/10   tortuous esophagus, intact Nissen fundoplication, dilated the spasm at esophageal GE junction   EYE SURGERY     bilateral cataract surgery   LAPAROSCOPIC NISSEN FUNDOPLICATION  8/07   Dr. Gladis   LARYNGOSCOPY  11/24/2017   dx.GERD,start omeprazole    Social History   Tobacco Use  Smoking Status Never   Passive exposure: Never  Smokeless Tobacco Never    Social History   Substance and Sexual Activity  Alcohol Use Yes   Alcohol/week: 0.0 standard drinks of alcohol   Comment: once a month or less    Family History  Problem Relation Age of Onset   Heart disease Mother    Heart disease Father    Diabetes Father    Marfan syndrome Sister    Heart disease Sister        valve replacement   Heart  disease Sister        pacemaker   Aortic aneurysm Sister    Peripheral Artery Disease Sister        amputation of toes   Breast cancer Sister 61   Heart disease Sister        CABG at 37   Colon cancer Sister 93   Heart disease Sister        CABG   Heart disease Sister    Heart disease Sister    Hyperlipidemia Sister    Stroke Sister        in 1948, mild   Heart disease Brother    Diabetes Brother    Heart disease Brother    Diabetes Brother    Heart disease Brother    Heart disease Brother    Heart disease Brother        CABG   Heart disease Brother        CABG   Aortic aneurysm Brother 59       repaired (at level of kidney)   Heart disease Brother        stent    Review of Systems: The review of systems is per the HPI.  All other systems were reviewed and are negative.  Physical Exam: There were no vitals taken for this visit. GENERAL:  [name] in NAD HEENT:  PERRL, EOMI, sclera are clear. Oropharynx is clear. NECK:  No jugular venous distention, carotid upstroke brisk and symmetric, no bruits, no thyromegaly or adenopathy LUNGS:  Clear to auscultation bilaterally CHEST:  Unremarkable HEART:  RRR,  PMI not displaced or sustained,S1 and S2 within normal limits, no S3, no S4: no clicks, no rubs, no murmurs ABD:  Soft, nontender. BS +, no masses or bruits. No hepatomegaly, no splenomegaly EXT:  2 + pulses throughout, no edema, no cyanosis no clubbing SKIN:  Warm and dry.  No rashes NEURO:  Alert and oriented x 3. Cranial nerves II through XII intact. PSYCH:  Cognitively intact    LABORATORY DATA:        Lab Results  Component Value Date   WBC 3.9 10/21/2023   HGB 12.6 10/21/2023   HCT 37.5 10/21/2023   PLT 239 10/21/2023   GLUCOSE 102 (H) 10/21/2023   CHOL 157 04/15/2023   TRIG 147 04/15/2023   HDL 52 04/15/2023   LDLCALC 80 04/15/2023   ALT 19 10/21/2023   AST 25 10/21/2023   NA 135 10/21/2023   K 4.2 10/21/2023   CL 99  10/21/2023   CREATININE 0.78 10/21/2023  BUN 18 10/21/2023   CO2 20 10/21/2023   TSH 1.460 06/24/2023   HGBA1C 5.4 09/24/2015   Dated 08/13/22: creatinine 1.05, CMET otherwise normal.        Echo 09/23/17: Study Conclusions   - Left ventricle: The cavity size was normal. Wall thickness was   normal. Systolic function was normal. The estimated ejection   fraction was in the range of 60% to 65%. Doppler parameters are   consistent with abnormal left ventricular relaxation (grade 1   diastolic dysfunction). The Ee&' ratio is between 8-15, suggesting   indeterminate LV filling pressure. - Left atrium: The atrium was normal in size. - Tricuspid valve: There was mild regurgitation. - Pulmonary arteries: PA peak pressure: 27 mm Hg (S). - Inferior vena cava: The vessel was normal in size. The   respirophasic diameter changes were in the normal range (>= 50%),   consistent with normal central venous pressure.   Impressions:   - Compared to a prior study in 2013, the LVEF is unchanged. Left   and right atria measure normal in size.  Myoview  09/29/17: Study Highlights     Nuclear stress EF: 55%. No T wave inversion was noted during stress. There was no ST segment deviation noted during stress. The study is normal. This is a low risk study.   Normal perfusion. LVEF 55% with normal wall motion. This is a low risk study. No change compared to prior study.      Assessment / Plan:  1. Obstructive sleep apnea. On CPAP therapy  2. PAF - treated with rate control with metoprolol  and Eliquis .  Now in NSR.  Continue current therapy. Labs ok. On lower Eliquis  dose due to age and weight.   3. Hypercholesterolemia. LDL of 80 on Crestor .  4. Hypothyroidism. TSH normal.   Follow up in 6 months. "

## 2024-03-24 ENCOUNTER — Ambulatory Visit: Attending: Cardiology | Admitting: Cardiology

## 2024-03-24 ENCOUNTER — Encounter: Payer: Self-pay | Admitting: Cardiology

## 2024-03-24 VITALS — BP 118/70 | HR 57 | Ht 59.0 in | Wt 119.2 lb

## 2024-03-24 DIAGNOSIS — E782 Mixed hyperlipidemia: Secondary | ICD-10-CM | POA: Insufficient documentation

## 2024-03-24 DIAGNOSIS — I480 Paroxysmal atrial fibrillation: Secondary | ICD-10-CM | POA: Diagnosis present

## 2024-03-24 NOTE — Patient Instructions (Signed)

## 2024-04-06 ENCOUNTER — Telehealth: Payer: Self-pay | Admitting: Adult Health

## 2024-04-06 NOTE — Telephone Encounter (Signed)
 Called pt to schedule the one year f/u and patient stated she is still having trouble getting her new CPAP machine that was ordered from her appt on 03/21/24, pt stated she has still not received the machine. Stated she needs it ASAP the old one is no longer working properly. Pt called the company and can't get a direct answer from them.   Routing to [facility] for further assistnace

## 2024-04-06 NOTE — Telephone Encounter (Signed)
 Looking into this.

## 2024-04-06 NOTE — Telephone Encounter (Signed)
 Spoke with [employer] and this order is currently being worked  Will inform patient

## 2024-04-06 NOTE — Telephone Encounter (Signed)
 Spoke with patient to inform her the order for her CPAP machine is being worked)  Told her if she has not heard from [place] [facility] by Friday to give us  a call back and I would call them again.  She voiced her understanding

## 2024-05-09 ENCOUNTER — Other Ambulatory Visit

## 2024-05-16 ENCOUNTER — Ambulatory Visit: Admitting: Family Medicine

## 2024-05-18 ENCOUNTER — Ambulatory Visit: Admitting: Family Medicine
